# Patient Record
Sex: Female | Born: 1945 | Race: White | Hispanic: No | Marital: Married | Smoking: Former smoker
Health system: Southern US, Community
[De-identification: ages and names within clinical notes are randomized; demographics above are authoritative.]

## PROBLEM LIST (undated history)

## (undated) DIAGNOSIS — S52501A Unspecified fracture of the lower end of right radius, initial encounter for closed fracture: Secondary | ICD-10-CM

## (undated) DIAGNOSIS — K219 Gastro-esophageal reflux disease without esophagitis: Secondary | ICD-10-CM

## (undated) DIAGNOSIS — C801 Malignant (primary) neoplasm, unspecified: Secondary | ICD-10-CM

## (undated) DIAGNOSIS — R519 Headache, unspecified: Secondary | ICD-10-CM

## (undated) DIAGNOSIS — G4733 Obstructive sleep apnea (adult) (pediatric): Secondary | ICD-10-CM

## (undated) DIAGNOSIS — E039 Hypothyroidism, unspecified: Secondary | ICD-10-CM

## (undated) DIAGNOSIS — E785 Hyperlipidemia, unspecified: Secondary | ICD-10-CM

## (undated) DIAGNOSIS — F419 Anxiety disorder, unspecified: Secondary | ICD-10-CM

## (undated) DIAGNOSIS — R51 Headache: Secondary | ICD-10-CM

## (undated) DIAGNOSIS — E559 Vitamin D deficiency, unspecified: Secondary | ICD-10-CM

## (undated) DIAGNOSIS — C569 Malignant neoplasm of unspecified ovary: Secondary | ICD-10-CM

## (undated) DIAGNOSIS — G43909 Migraine, unspecified, not intractable, without status migrainosus: Secondary | ICD-10-CM

## (undated) DIAGNOSIS — G2581 Restless legs syndrome: Secondary | ICD-10-CM

## (undated) DIAGNOSIS — M810 Age-related osteoporosis without current pathological fracture: Secondary | ICD-10-CM

## (undated) DIAGNOSIS — E119 Type 2 diabetes mellitus without complications: Secondary | ICD-10-CM

## (undated) DIAGNOSIS — I2510 Atherosclerotic heart disease of native coronary artery without angina pectoris: Secondary | ICD-10-CM

## (undated) HISTORY — DX: Type 2 diabetes mellitus without complications: E11.9

## (undated) HISTORY — DX: Age-related osteoporosis without current pathological fracture: M81.0

## (undated) HISTORY — PX: OTHER SURGICAL HISTORY: SHX169

## (undated) HISTORY — DX: Restless legs syndrome: G25.81

## (undated) HISTORY — PX: CATARACT EXTRACTION: SUR2

## (undated) HISTORY — DX: Malignant neoplasm of unspecified ovary: C56.9

## (undated) HISTORY — DX: Migraine, unspecified, not intractable, without status migrainosus: G43.909

## (undated) HISTORY — DX: Hyperlipidemia, unspecified: E78.5

## (undated) HISTORY — DX: Vitamin D deficiency, unspecified: E55.9

## (undated) HISTORY — DX: Atherosclerotic heart disease of native coronary artery without angina pectoris: I25.10

## (undated) HISTORY — DX: Obstructive sleep apnea (adult) (pediatric): G47.33

---

## 1964-05-27 HISTORY — PX: CHOLECYSTECTOMY: SHX55

## 1997-05-27 HISTORY — PX: VESICOVAGINAL FISTULA CLOSURE W/ TAH: SUR271

## 1997-08-16 ENCOUNTER — Other Ambulatory Visit: Admission: RE | Admit: 1997-08-16 | Discharge: 1997-08-16 | Payer: Self-pay | Admitting: Gynecology

## 1998-08-29 ENCOUNTER — Other Ambulatory Visit: Admission: RE | Admit: 1998-08-29 | Discharge: 1998-08-29 | Payer: Self-pay | Admitting: Gynecology

## 1999-09-26 ENCOUNTER — Other Ambulatory Visit: Admission: RE | Admit: 1999-09-26 | Discharge: 1999-09-26 | Payer: Self-pay | Admitting: Gynecology

## 2000-11-18 ENCOUNTER — Other Ambulatory Visit: Admission: RE | Admit: 2000-11-18 | Discharge: 2000-11-18 | Payer: Self-pay | Admitting: Gynecology

## 2003-12-20 ENCOUNTER — Other Ambulatory Visit: Admission: RE | Admit: 2003-12-20 | Discharge: 2003-12-20 | Payer: Self-pay | Admitting: Family Medicine

## 2008-06-13 ENCOUNTER — Inpatient Hospital Stay (HOSPITAL_COMMUNITY): Admission: EM | Admit: 2008-06-13 | Discharge: 2008-06-24 | Payer: Self-pay | Admitting: Internal Medicine

## 2008-06-13 ENCOUNTER — Ambulatory Visit: Payer: Self-pay | Admitting: Internal Medicine

## 2008-06-17 ENCOUNTER — Encounter: Payer: Self-pay | Admitting: Pulmonary Disease

## 2008-06-22 ENCOUNTER — Ambulatory Visit: Payer: Self-pay | Admitting: Gastroenterology

## 2008-06-22 ENCOUNTER — Encounter: Payer: Self-pay | Admitting: Gastroenterology

## 2008-06-29 ENCOUNTER — Ambulatory Visit (HOSPITAL_COMMUNITY): Admission: RE | Admit: 2008-06-29 | Discharge: 2008-06-29 | Payer: Self-pay | Admitting: Internal Medicine

## 2008-07-01 ENCOUNTER — Encounter (INDEPENDENT_AMBULATORY_CARE_PROVIDER_SITE_OTHER): Payer: Self-pay | Admitting: Diagnostic Radiology

## 2008-07-01 ENCOUNTER — Ambulatory Visit (HOSPITAL_COMMUNITY): Admission: RE | Admit: 2008-07-01 | Discharge: 2008-07-01 | Payer: Self-pay | Admitting: Internal Medicine

## 2008-07-11 ENCOUNTER — Ambulatory Visit (HOSPITAL_BASED_OUTPATIENT_CLINIC_OR_DEPARTMENT_OTHER): Admission: RE | Admit: 2008-07-11 | Discharge: 2008-07-11 | Payer: Self-pay | Admitting: General Surgery

## 2008-07-11 ENCOUNTER — Encounter (INDEPENDENT_AMBULATORY_CARE_PROVIDER_SITE_OTHER): Payer: Self-pay | Admitting: General Surgery

## 2008-07-14 ENCOUNTER — Ambulatory Visit: Payer: Self-pay | Admitting: Oncology

## 2008-07-14 LAB — CBC WITH DIFFERENTIAL/PLATELET
BASO%: 0 % (ref 0.0–2.0)
LYMPH%: 3.6 % — ABNORMAL LOW (ref 14.0–48.0)
MCHC: 32.2 g/dL (ref 32.0–36.0)
MONO#: 1.2 10*3/uL — ABNORMAL HIGH (ref 0.1–0.9)
Platelets: 275 10*3/uL (ref 145–400)
RBC: 4.76 10*6/uL (ref 3.70–5.32)
WBC: 19.1 10*3/uL — ABNORMAL HIGH (ref 3.9–10.0)

## 2008-07-14 LAB — HEPATIC FUNCTION PANEL
AST: 26 U/L (ref 0–37)
Albumin: 3 g/dL — ABNORMAL LOW (ref 3.5–5.2)
Total Bilirubin: 0.5 mg/dL (ref 0.3–1.2)

## 2008-07-14 LAB — CHCC SMEAR

## 2008-07-14 LAB — FERRITIN: Ferritin: 413 ng/mL — ABNORMAL HIGH (ref 10–291)

## 2008-07-18 ENCOUNTER — Ambulatory Visit: Admission: RE | Admit: 2008-07-18 | Discharge: 2008-07-18 | Payer: Self-pay | Admitting: Oncology

## 2008-07-20 ENCOUNTER — Ambulatory Visit (HOSPITAL_COMMUNITY): Admission: RE | Admit: 2008-07-20 | Discharge: 2008-07-20 | Payer: Self-pay | Admitting: Oncology

## 2008-07-25 ENCOUNTER — Ambulatory Visit (HOSPITAL_COMMUNITY): Admission: RE | Admit: 2008-07-25 | Discharge: 2008-07-25 | Payer: Self-pay | Admitting: Oncology

## 2008-08-02 LAB — COMPREHENSIVE METABOLIC PANEL
ALT: 69 U/L — ABNORMAL HIGH (ref 0–35)
Albumin: 2.3 g/dL — ABNORMAL LOW (ref 3.5–5.2)
CO2: 24 mEq/L (ref 19–32)
Calcium: 8.5 mg/dL (ref 8.4–10.5)
Chloride: 99 mEq/L (ref 96–112)
Creatinine, Ser: 0.45 mg/dL (ref 0.40–1.20)
Potassium: 3.9 mEq/L (ref 3.5–5.3)

## 2008-08-02 LAB — CBC WITH DIFFERENTIAL/PLATELET
BASO%: 0.1 % (ref 0.0–2.0)
HCT: 33.5 % — ABNORMAL LOW (ref 34.8–46.6)
LYMPH%: 6 % — ABNORMAL LOW (ref 14.0–49.7)
MCH: 24.4 pg — ABNORMAL LOW (ref 25.1–34.0)
MCHC: 32.5 g/dL (ref 31.5–36.0)
MCV: 75.1 fL — ABNORMAL LOW (ref 79.5–101.0)
MONO#: 0.1 10*3/uL (ref 0.1–0.9)
MONO%: 1.9 % (ref 0.0–14.0)
NEUT%: 88.7 % — ABNORMAL HIGH (ref 38.4–76.8)
Platelets: 289 10*3/uL (ref 145–400)
RBC: 4.46 10*6/uL (ref 3.70–5.45)

## 2008-08-08 LAB — COMPREHENSIVE METABOLIC PANEL
ALT: 55 U/L — ABNORMAL HIGH (ref 0–35)
AST: 43 U/L — ABNORMAL HIGH (ref 0–37)
CO2: 27 mEq/L (ref 19–32)
Calcium: 8.6 mg/dL (ref 8.4–10.5)
Chloride: 101 mEq/L (ref 96–112)
Potassium: 4.2 mEq/L (ref 3.5–5.3)
Sodium: 134 mEq/L — ABNORMAL LOW (ref 135–145)
Total Protein: 6.8 g/dL (ref 6.0–8.3)

## 2008-08-08 LAB — CBC WITH DIFFERENTIAL/PLATELET
BASO%: 0.3 % (ref 0.0–2.0)
EOS%: 4.7 % (ref 0.0–7.0)
HCT: 33 % — ABNORMAL LOW (ref 34.8–46.6)
MCH: 24.6 pg — ABNORMAL LOW (ref 25.1–34.0)
MCHC: 32.3 g/dL (ref 31.5–36.0)
MONO#: 0.6 10*3/uL (ref 0.1–0.9)
NEUT%: 67.4 % (ref 38.4–76.8)
RBC: 4.34 10*6/uL (ref 3.70–5.45)
RDW: 19.5 % — ABNORMAL HIGH (ref 11.2–14.5)
WBC: 3.8 10*3/uL — ABNORMAL LOW (ref 3.9–10.3)
lymph#: 0.4 10*3/uL — ABNORMAL LOW (ref 0.9–3.3)

## 2008-08-08 LAB — LACTATE DEHYDROGENASE: LDH: 87 U/L — ABNORMAL LOW (ref 94–250)

## 2008-08-16 LAB — CBC WITH DIFFERENTIAL/PLATELET
BASO%: 0.1 % (ref 0.0–2.0)
Basophils Absolute: 0 10*3/uL (ref 0.0–0.1)
EOS%: 1 % (ref 0.0–7.0)
HGB: 11.1 g/dL — ABNORMAL LOW (ref 11.6–15.9)
MCH: 23.8 pg — ABNORMAL LOW (ref 25.1–34.0)
MCHC: 31.4 g/dL — ABNORMAL LOW (ref 31.5–36.0)
MCV: 75.8 fL — ABNORMAL LOW (ref 79.5–101.0)
MONO%: 0.7 % (ref 0.0–14.0)
NEUT%: 86.7 % — ABNORMAL HIGH (ref 38.4–76.8)
RDW: 18.8 % — ABNORMAL HIGH (ref 11.2–14.5)
lymph#: 1.2 10*3/uL (ref 0.9–3.3)

## 2008-08-23 LAB — CBC WITH DIFFERENTIAL/PLATELET
Eosinophils Absolute: 0.1 10*3/uL (ref 0.0–0.5)
MONO#: 1 10*3/uL — ABNORMAL HIGH (ref 0.1–0.9)
NEUT#: 1.6 10*3/uL (ref 1.5–6.5)
RBC: 4.19 10*6/uL (ref 3.70–5.45)
RDW: 19.1 % — ABNORMAL HIGH (ref 11.2–14.5)
WBC: 3.5 10*3/uL — ABNORMAL LOW (ref 3.9–10.3)
lymph#: 0.8 10*3/uL — ABNORMAL LOW (ref 0.9–3.3)
nRBC: 0 % (ref 0–0)

## 2008-08-23 LAB — COMPREHENSIVE METABOLIC PANEL
ALT: 26 U/L (ref 0–35)
CO2: 27 mEq/L (ref 19–32)
Calcium: 8.6 mg/dL (ref 8.4–10.5)
Chloride: 106 mEq/L (ref 96–112)
Sodium: 140 mEq/L (ref 135–145)
Total Bilirubin: 0.2 mg/dL — ABNORMAL LOW (ref 0.3–1.2)
Total Protein: 6 g/dL (ref 6.0–8.3)

## 2008-08-24 ENCOUNTER — Ambulatory Visit: Payer: Self-pay | Admitting: Oncology

## 2008-08-30 LAB — COMPREHENSIVE METABOLIC PANEL
ALT: 41 U/L — ABNORMAL HIGH (ref 0–35)
Calcium: 8.9 mg/dL (ref 8.4–10.5)
Potassium: 4 mEq/L (ref 3.5–5.3)
Sodium: 135 mEq/L (ref 135–145)

## 2008-08-30 LAB — CBC WITH DIFFERENTIAL/PLATELET
BASO%: 0.1 % (ref 0.0–2.0)
Eosinophils Absolute: 0.1 10*3/uL (ref 0.0–0.5)
LYMPH%: 4.4 % — ABNORMAL LOW (ref 14.0–49.7)
MCHC: 32.4 g/dL (ref 31.5–36.0)
MONO#: 0.1 10*3/uL (ref 0.1–0.9)
NEUT#: 13.4 10*3/uL — ABNORMAL HIGH (ref 1.5–6.5)
Platelets: 267 10*3/uL (ref 145–400)
RBC: 4.94 10*6/uL (ref 3.70–5.45)
RDW: 19 % — ABNORMAL HIGH (ref 11.2–14.5)
WBC: 14.2 10*3/uL — ABNORMAL HIGH (ref 3.9–10.3)

## 2008-08-30 LAB — LACTATE DEHYDROGENASE: LDH: 84 U/L — ABNORMAL LOW (ref 94–250)

## 2008-09-06 LAB — COMPREHENSIVE METABOLIC PANEL
AST: 17 U/L (ref 0–37)
Albumin: 2.7 g/dL — ABNORMAL LOW (ref 3.5–5.2)
Alkaline Phosphatase: 111 U/L (ref 39–117)
Calcium: 8.4 mg/dL (ref 8.4–10.5)
Chloride: 108 mEq/L (ref 96–112)
Glucose, Bld: 73 mg/dL (ref 70–99)
Potassium: 3 mEq/L — ABNORMAL LOW (ref 3.5–5.3)
Sodium: 139 mEq/L (ref 135–145)
Total Protein: 5.4 g/dL — ABNORMAL LOW (ref 6.0–8.3)

## 2008-09-06 LAB — CBC WITH DIFFERENTIAL/PLATELET
BASO%: 0.8 % (ref 0.0–2.0)
Eosinophils Absolute: 0.1 10*3/uL (ref 0.0–0.5)
HCT: 35 % (ref 34.8–46.6)
LYMPH%: 17.5 % (ref 14.0–49.7)
MCHC: 32 g/dL (ref 31.5–36.0)
MONO#: 0.6 10*3/uL (ref 0.1–0.9)
NEUT#: 3.2 10*3/uL (ref 1.5–6.5)
NEUT%: 67.1 % (ref 38.4–76.8)
Platelets: 310 10*3/uL (ref 145–400)
WBC: 4.7 10*3/uL (ref 3.9–10.3)
lymph#: 0.8 10*3/uL — ABNORMAL LOW (ref 0.9–3.3)
nRBC: 0 % (ref 0–0)

## 2008-09-13 LAB — COMPREHENSIVE METABOLIC PANEL
ALT: 68 U/L — ABNORMAL HIGH (ref 0–35)
AST: 35 U/L (ref 0–37)
Albumin: 3 g/dL — ABNORMAL LOW (ref 3.5–5.2)
Alkaline Phosphatase: 92 U/L (ref 39–117)
BUN: 15 mg/dL (ref 6–23)
CO2: 26 mEq/L (ref 19–32)
Calcium: 8.5 mg/dL (ref 8.4–10.5)
Chloride: 104 mEq/L (ref 96–112)
Creatinine, Ser: 0.5 mg/dL (ref 0.40–1.20)
Glucose, Bld: 108 mg/dL — ABNORMAL HIGH (ref 70–99)
Potassium: 3.5 mEq/L (ref 3.5–5.3)
Sodium: 138 mEq/L (ref 135–145)
Total Bilirubin: 0.5 mg/dL (ref 0.3–1.2)
Total Protein: 5.5 g/dL — ABNORMAL LOW (ref 6.0–8.3)

## 2008-09-13 LAB — LACTATE DEHYDROGENASE: LDH: 95 U/L (ref 94–250)

## 2008-09-13 LAB — CBC WITH DIFFERENTIAL/PLATELET
BASO%: 0 % (ref 0.0–2.0)
EOS%: 0.3 % (ref 0.0–7.0)
MCH: 25.7 pg (ref 25.1–34.0)
MCV: 78.4 fL — ABNORMAL LOW (ref 79.5–101.0)
MONO%: 0.5 % (ref 0.0–14.0)
RBC: 4.53 10*6/uL (ref 3.70–5.45)
RDW: 21.2 % — ABNORMAL HIGH (ref 11.2–14.5)
lymph#: 1.9 10*3/uL (ref 0.9–3.3)

## 2008-09-20 LAB — COMPREHENSIVE METABOLIC PANEL
ALT: 35 U/L (ref 0–35)
AST: 20 U/L (ref 0–37)
Alkaline Phosphatase: 101 U/L (ref 39–117)
CO2: 26 mEq/L (ref 19–32)
Creatinine, Ser: 0.44 mg/dL (ref 0.40–1.20)
Sodium: 139 mEq/L (ref 135–145)
Total Bilirubin: 0.5 mg/dL (ref 0.3–1.2)
Total Protein: 5.4 g/dL — ABNORMAL LOW (ref 6.0–8.3)

## 2008-09-20 LAB — TECHNOLOGIST REVIEW

## 2008-09-20 LAB — CBC WITH DIFFERENTIAL/PLATELET
Basophils Absolute: 0 10*3/uL (ref 0.0–0.1)
EOS%: 1.4 % (ref 0.0–7.0)
HGB: 11 g/dL — ABNORMAL LOW (ref 11.6–15.9)
MCH: 25.4 pg (ref 25.1–34.0)
MCV: 78.3 fL — ABNORMAL LOW (ref 79.5–101.0)
MONO%: 32.3 % — ABNORMAL HIGH (ref 0.0–14.0)
NEUT#: 1 10*3/uL — ABNORMAL LOW (ref 1.5–6.5)
RBC: 4.33 10*6/uL (ref 3.70–5.45)
RDW: 21.8 % — ABNORMAL HIGH (ref 11.2–14.5)
lymph#: 0.9 10*3/uL (ref 0.9–3.3)
nRBC: 1 % — ABNORMAL HIGH (ref 0–0)

## 2008-09-28 LAB — BASIC METABOLIC PANEL
BUN: 7 mg/dL (ref 6–23)
Chloride: 109 mEq/L (ref 96–112)
Potassium: 2.7 mEq/L — CL (ref 3.5–5.3)

## 2008-10-04 LAB — COMPREHENSIVE METABOLIC PANEL
Albumin: 2.3 g/dL — ABNORMAL LOW (ref 3.5–5.2)
Alkaline Phosphatase: 99 U/L (ref 39–117)
BUN: 9 mg/dL (ref 6–23)
Calcium: 8 mg/dL — ABNORMAL LOW (ref 8.4–10.5)
Chloride: 106 mEq/L (ref 96–112)
Creatinine, Ser: 0.37 mg/dL — ABNORMAL LOW (ref 0.40–1.20)
Glucose, Bld: 72 mg/dL (ref 70–99)
Potassium: 3.5 mEq/L (ref 3.5–5.3)

## 2008-10-04 LAB — CBC WITH DIFFERENTIAL/PLATELET
Basophils Absolute: 0.1 10*3/uL (ref 0.0–0.1)
Eosinophils Absolute: 0 10*3/uL (ref 0.0–0.5)
HCT: 35.2 % (ref 34.8–46.6)
HGB: 11.1 g/dL — ABNORMAL LOW (ref 11.6–15.9)
LYMPH%: 22 % (ref 14.0–49.7)
MCV: 82.2 fL (ref 79.5–101.0)
MONO%: 25.2 % — ABNORMAL HIGH (ref 0.0–14.0)
NEUT#: 2 10*3/uL (ref 1.5–6.5)
NEUT%: 49.4 % (ref 38.4–76.8)
Platelets: 175 10*3/uL (ref 145–400)

## 2008-10-11 ENCOUNTER — Ambulatory Visit: Payer: Self-pay | Admitting: Oncology

## 2008-10-11 LAB — TECHNOLOGIST REVIEW

## 2008-10-11 LAB — CBC WITH DIFFERENTIAL/PLATELET
Basophils Absolute: 0 10*3/uL (ref 0.0–0.1)
Eosinophils Absolute: 0 10*3/uL (ref 0.0–0.5)
HGB: 12.4 g/dL (ref 11.6–15.9)
MONO#: 0 10*3/uL — ABNORMAL LOW (ref 0.1–0.9)
NEUT#: 12.6 10*3/uL — ABNORMAL HIGH (ref 1.5–6.5)
RBC: 4.66 10*6/uL (ref 3.70–5.45)
RDW: 24.8 % — ABNORMAL HIGH (ref 11.2–14.5)
WBC: 14.3 10*3/uL — ABNORMAL HIGH (ref 3.9–10.3)

## 2008-10-11 LAB — COMPREHENSIVE METABOLIC PANEL
Albumin: 3.3 g/dL — ABNORMAL LOW (ref 3.5–5.2)
Alkaline Phosphatase: 98 U/L (ref 39–117)
BUN: 17 mg/dL (ref 6–23)
CO2: 26 mEq/L (ref 19–32)
Glucose, Bld: 96 mg/dL (ref 70–99)
Potassium: 3.3 mEq/L — ABNORMAL LOW (ref 3.5–5.3)
Total Bilirubin: 0.5 mg/dL (ref 0.3–1.2)
Total Protein: 6 g/dL (ref 6.0–8.3)

## 2008-10-13 ENCOUNTER — Ambulatory Visit (HOSPITAL_COMMUNITY): Admission: RE | Admit: 2008-10-13 | Discharge: 2008-10-13 | Payer: Self-pay | Admitting: Oncology

## 2008-10-18 LAB — CBC WITH DIFFERENTIAL/PLATELET
Basophils Absolute: 0 10*3/uL (ref 0.0–0.1)
Eosinophils Absolute: 0 10*3/uL (ref 0.0–0.5)
HGB: 10.5 g/dL — ABNORMAL LOW (ref 11.6–15.9)
MCV: 82.6 fL (ref 79.5–101.0)
MONO#: 0.9 10*3/uL (ref 0.1–0.9)
MONO%: 30.5 % — ABNORMAL HIGH (ref 0.0–14.0)
NEUT#: 1.2 10*3/uL — ABNORMAL LOW (ref 1.5–6.5)
RBC: 3.97 10*6/uL (ref 3.70–5.45)
RDW: 22.9 % — ABNORMAL HIGH (ref 11.2–14.5)
WBC: 2.8 10*3/uL — ABNORMAL LOW (ref 3.9–10.3)

## 2008-10-18 LAB — LACTATE DEHYDROGENASE: LDH: 150 U/L (ref 94–250)

## 2008-10-18 LAB — COMPREHENSIVE METABOLIC PANEL
ALT: 33 U/L (ref 0–35)
Albumin: 2.5 g/dL — ABNORMAL LOW (ref 3.5–5.2)
CO2: 24 mEq/L (ref 19–32)
Calcium: 7.9 mg/dL — ABNORMAL LOW (ref 8.4–10.5)
Chloride: 107 mEq/L (ref 96–112)
Glucose, Bld: 94 mg/dL (ref 70–99)
Potassium: 3.8 mEq/L (ref 3.5–5.3)
Sodium: 138 mEq/L (ref 135–145)
Total Bilirubin: 0.5 mg/dL (ref 0.3–1.2)
Total Protein: 5.4 g/dL — ABNORMAL LOW (ref 6.0–8.3)

## 2008-10-25 LAB — COMPREHENSIVE METABOLIC PANEL
CO2: 25 mEq/L (ref 19–32)
Glucose, Bld: 98 mg/dL (ref 70–99)
Sodium: 133 mEq/L — ABNORMAL LOW (ref 135–145)
Total Bilirubin: 0.5 mg/dL (ref 0.3–1.2)
Total Protein: 5.5 g/dL — ABNORMAL LOW (ref 6.0–8.3)

## 2008-10-25 LAB — CBC WITH DIFFERENTIAL/PLATELET
Basophils Absolute: 0 10*3/uL (ref 0.0–0.1)
EOS%: 0.1 % (ref 0.0–7.0)
Eosinophils Absolute: 0 10*3/uL (ref 0.0–0.5)
HCT: 34.4 % — ABNORMAL LOW (ref 34.8–46.6)
HGB: 11.2 g/dL — ABNORMAL LOW (ref 11.6–15.9)
LYMPH%: 15.3 % (ref 14.0–49.7)
MCH: 26.7 pg (ref 25.1–34.0)
MCV: 82.1 fL (ref 79.5–101.0)
MONO%: 0.8 % (ref 0.0–14.0)
NEUT#: 8.6 10*3/uL — ABNORMAL HIGH (ref 1.5–6.5)
NEUT%: 83.7 % — ABNORMAL HIGH (ref 38.4–76.8)
Platelets: 202 10*3/uL (ref 145–400)
RDW: 21.9 % — ABNORMAL HIGH (ref 11.2–14.5)

## 2008-10-25 LAB — TECHNOLOGIST REVIEW

## 2008-11-01 LAB — CBC WITH DIFFERENTIAL/PLATELET
BASO%: 0.5 % (ref 0.0–2.0)
LYMPH%: 23.5 % (ref 14.0–49.7)
MCH: 27.2 pg (ref 25.1–34.0)
MCHC: 31.7 g/dL (ref 31.5–36.0)
MCV: 85.8 fL (ref 79.5–101.0)
MONO%: 24 % — ABNORMAL HIGH (ref 0.0–14.0)
Platelets: 93 10*3/uL — ABNORMAL LOW (ref 145–400)
RBC: 3.6 10*6/uL — ABNORMAL LOW (ref 3.70–5.45)
nRBC: 1 % — ABNORMAL HIGH (ref 0–0)

## 2008-11-01 LAB — COMPREHENSIVE METABOLIC PANEL
ALT: 27 U/L (ref 0–35)
AST: 24 U/L (ref 0–37)
Albumin: 2.6 g/dL — ABNORMAL LOW (ref 3.5–5.2)
Alkaline Phosphatase: 96 U/L (ref 39–117)
Glucose, Bld: 85 mg/dL (ref 70–99)
Potassium: 4.1 mEq/L (ref 3.5–5.3)
Sodium: 135 mEq/L (ref 135–145)
Total Protein: 5 g/dL — ABNORMAL LOW (ref 6.0–8.3)

## 2008-11-02 ENCOUNTER — Ambulatory Visit (HOSPITAL_COMMUNITY): Admission: RE | Admit: 2008-11-02 | Discharge: 2008-11-02 | Payer: Self-pay | Admitting: Oncology

## 2008-11-04 ENCOUNTER — Ambulatory Visit (HOSPITAL_COMMUNITY): Admission: RE | Admit: 2008-11-04 | Discharge: 2008-11-04 | Payer: Self-pay | Admitting: Oncology

## 2008-11-08 LAB — URINALYSIS, MICROSCOPIC - CHCC
Ketones: NEGATIVE mg/dL
Leukocyte Esterase: NEGATIVE
Nitrite: NEGATIVE
Protein: NEGATIVE mg/dL
RBC count: NEGATIVE (ref 0–2)
pH: 6 (ref 4.6–8.0)

## 2008-11-08 LAB — CBC WITH DIFFERENTIAL/PLATELET
Basophils Absolute: 0 10*3/uL (ref 0.0–0.1)
EOS%: 0.3 % (ref 0.0–7.0)
LYMPH%: 19 % (ref 14.0–49.7)
MCH: 29.4 pg (ref 25.1–34.0)
MCV: 85.8 fL (ref 79.5–101.0)
MONO%: 6.5 % (ref 0.0–14.0)
Platelets: 164 10*3/uL (ref 145–400)
RBC: 3.97 10*6/uL (ref 3.70–5.45)
RDW: 24.8 % — ABNORMAL HIGH (ref 11.2–14.5)

## 2008-11-08 LAB — COMPREHENSIVE METABOLIC PANEL
AST: 26 U/L (ref 0–37)
Albumin: 2.8 g/dL — ABNORMAL LOW (ref 3.5–5.2)
Alkaline Phosphatase: 104 U/L (ref 39–117)
BUN: 17 mg/dL (ref 6–23)
Calcium: 8.3 mg/dL — ABNORMAL LOW (ref 8.4–10.5)
Chloride: 103 mEq/L (ref 96–112)
Potassium: 4.3 mEq/L (ref 3.5–5.3)
Sodium: 135 mEq/L (ref 135–145)
Total Protein: 4.9 g/dL — ABNORMAL LOW (ref 6.0–8.3)

## 2008-11-10 ENCOUNTER — Encounter: Payer: Self-pay | Admitting: Oncology

## 2008-11-11 ENCOUNTER — Encounter (INDEPENDENT_AMBULATORY_CARE_PROVIDER_SITE_OTHER): Payer: Self-pay | Admitting: Interventional Radiology

## 2008-11-11 ENCOUNTER — Ambulatory Visit (HOSPITAL_COMMUNITY): Admission: RE | Admit: 2008-11-11 | Discharge: 2008-11-11 | Payer: Self-pay | Admitting: Oncology

## 2008-11-15 LAB — COMPREHENSIVE METABOLIC PANEL
ALT: 28 U/L (ref 0–35)
AST: 22 U/L (ref 0–37)
Alkaline Phosphatase: 113 U/L (ref 39–117)
Creatinine, Ser: 0.46 mg/dL (ref 0.40–1.20)
Total Bilirubin: 0.4 mg/dL (ref 0.3–1.2)

## 2008-11-15 LAB — CBC WITH DIFFERENTIAL/PLATELET
Basophils Absolute: 0.1 10*3/uL (ref 0.0–0.1)
EOS%: 0.6 % (ref 0.0–7.0)
Eosinophils Absolute: 0 10*3/uL (ref 0.0–0.5)
HCT: 31.9 % — ABNORMAL LOW (ref 34.8–46.6)
HGB: 9.9 g/dL — ABNORMAL LOW (ref 11.6–15.9)
LYMPH%: 12.7 % — ABNORMAL LOW (ref 14.0–49.7)
MCH: 27.9 pg (ref 25.1–34.0)
MCV: 89.9 fL (ref 79.5–101.0)
MONO%: 18.1 % — ABNORMAL HIGH (ref 0.0–14.0)
NEUT#: 3.4 10*3/uL (ref 1.5–6.5)
NEUT%: 67.4 % (ref 38.4–76.8)
Platelets: 197 10*3/uL (ref 145–400)
RDW: 24.8 % — ABNORMAL HIGH (ref 11.2–14.5)

## 2008-11-15 LAB — LACTATE DEHYDROGENASE: LDH: 269 U/L — ABNORMAL HIGH (ref 94–250)

## 2008-11-22 ENCOUNTER — Ambulatory Visit: Payer: Self-pay | Admitting: Oncology

## 2008-11-22 ENCOUNTER — Ambulatory Visit (HOSPITAL_COMMUNITY): Admission: RE | Admit: 2008-11-22 | Discharge: 2008-11-22 | Payer: Self-pay | Admitting: Interventional Radiology

## 2008-11-22 LAB — CBC WITH DIFFERENTIAL/PLATELET
BASO%: 0.1 % (ref 0.0–2.0)
HCT: 31.4 % — ABNORMAL LOW (ref 34.8–46.6)
LYMPH%: 10.6 % — ABNORMAL LOW (ref 14.0–49.7)
MCH: 30.1 pg (ref 25.1–34.0)
MCHC: 34.1 g/dL (ref 31.5–36.0)
MCV: 88.1 fL (ref 79.5–101.0)
MONO#: 0 10*3/uL — ABNORMAL LOW (ref 0.1–0.9)
NEUT%: 89 % — ABNORMAL HIGH (ref 38.4–76.8)
Platelets: 177 10*3/uL (ref 145–400)

## 2008-11-22 LAB — COMPREHENSIVE METABOLIC PANEL
Albumin: 2.9 g/dL — ABNORMAL LOW (ref 3.5–5.2)
Alkaline Phosphatase: 93 U/L (ref 39–117)
BUN: 18 mg/dL (ref 6–23)
Calcium: 8.2 mg/dL — ABNORMAL LOW (ref 8.4–10.5)
Glucose, Bld: 104 mg/dL — ABNORMAL HIGH (ref 70–99)
Potassium: 4 mEq/L (ref 3.5–5.3)

## 2008-11-23 ENCOUNTER — Ambulatory Visit (HOSPITAL_COMMUNITY): Admission: RE | Admit: 2008-11-23 | Discharge: 2008-11-23 | Payer: Self-pay | Admitting: Interventional Radiology

## 2008-11-25 ENCOUNTER — Encounter (INDEPENDENT_AMBULATORY_CARE_PROVIDER_SITE_OTHER): Payer: Self-pay | Admitting: Interventional Radiology

## 2008-11-25 ENCOUNTER — Ambulatory Visit (HOSPITAL_COMMUNITY): Admission: RE | Admit: 2008-11-25 | Discharge: 2008-11-25 | Payer: Self-pay | Admitting: Interventional Radiology

## 2008-11-29 ENCOUNTER — Ambulatory Visit (HOSPITAL_COMMUNITY)
Admission: RE | Admit: 2008-11-29 | Discharge: 2008-11-29 | Payer: Self-pay | Admitting: Physical Medicine and Rehabilitation

## 2008-11-29 LAB — TECHNOLOGIST REVIEW

## 2008-11-29 LAB — CBC WITH DIFFERENTIAL/PLATELET
BASO%: 0.6 % (ref 0.0–2.0)
Eosinophils Absolute: 0 10*3/uL (ref 0.0–0.5)
LYMPH%: 30.2 % (ref 14.0–49.7)
MCHC: 31.6 g/dL (ref 31.5–36.0)
MONO#: 0.5 10*3/uL (ref 0.1–0.9)
NEUT#: 0.7 10*3/uL — ABNORMAL LOW (ref 1.5–6.5)
Platelets: 86 10*3/uL — ABNORMAL LOW (ref 145–400)
RBC: 3.83 10*6/uL (ref 3.70–5.45)
RDW: 24.3 % — ABNORMAL HIGH (ref 11.2–14.5)
WBC: 1.7 10*3/uL — ABNORMAL LOW (ref 3.9–10.3)
lymph#: 0.5 10*3/uL — ABNORMAL LOW (ref 0.9–3.3)

## 2008-11-29 LAB — COMPREHENSIVE METABOLIC PANEL
Albumin: 2.7 g/dL — ABNORMAL LOW (ref 3.5–5.2)
BUN: 10 mg/dL (ref 6–23)
Calcium: 8.1 mg/dL — ABNORMAL LOW (ref 8.4–10.5)
Chloride: 107 mEq/L (ref 96–112)
Creatinine, Ser: 0.52 mg/dL (ref 0.40–1.20)
Glucose, Bld: 106 mg/dL — ABNORMAL HIGH (ref 70–99)
Potassium: 4 mEq/L (ref 3.5–5.3)

## 2008-12-06 ENCOUNTER — Ambulatory Visit (HOSPITAL_COMMUNITY): Admission: RE | Admit: 2008-12-06 | Discharge: 2008-12-06 | Payer: Self-pay | Admitting: Interventional Radiology

## 2008-12-06 ENCOUNTER — Ambulatory Visit (HOSPITAL_COMMUNITY): Admission: RE | Admit: 2008-12-06 | Discharge: 2008-12-06 | Payer: Self-pay | Admitting: Oncology

## 2008-12-06 LAB — COMPREHENSIVE METABOLIC PANEL
AST: 31 U/L (ref 0–37)
Albumin: 3 g/dL — ABNORMAL LOW (ref 3.5–5.2)
Alkaline Phosphatase: 210 U/L — ABNORMAL HIGH (ref 39–117)
Calcium: 8.2 mg/dL — ABNORMAL LOW (ref 8.4–10.5)
Chloride: 107 mEq/L (ref 96–112)
Glucose, Bld: 103 mg/dL — ABNORMAL HIGH (ref 70–99)
Potassium: 4.1 mEq/L (ref 3.5–5.3)
Sodium: 137 mEq/L (ref 135–145)
Total Protein: 5.2 g/dL — ABNORMAL LOW (ref 6.0–8.3)

## 2008-12-06 LAB — CBC WITH DIFFERENTIAL/PLATELET
BASO%: 1.2 % (ref 0.0–2.0)
Basophils Absolute: 0.2 10*3/uL — ABNORMAL HIGH (ref 0.0–0.1)
EOS%: 0.1 % (ref 0.0–7.0)
HCT: 34.1 % — ABNORMAL LOW (ref 34.8–46.6)
HGB: 10.7 g/dL — ABNORMAL LOW (ref 11.6–15.9)
MCH: 29.3 pg (ref 25.1–34.0)
MONO#: 2.3 10*3/uL — ABNORMAL HIGH (ref 0.1–0.9)
RDW: 24.8 % — ABNORMAL HIGH (ref 11.2–14.5)
WBC: 20.5 10*3/uL — ABNORMAL HIGH (ref 3.9–10.3)
lymph#: 1.6 10*3/uL (ref 0.9–3.3)

## 2008-12-12 ENCOUNTER — Ambulatory Visit (HOSPITAL_COMMUNITY): Admission: RE | Admit: 2008-12-12 | Discharge: 2008-12-12 | Payer: Self-pay | Admitting: Oncology

## 2008-12-13 LAB — CBC WITH DIFFERENTIAL/PLATELET
BASO%: 2.3 % — ABNORMAL HIGH (ref 0.0–2.0)
EOS%: 0.1 % (ref 0.0–7.0)
MCH: 31.3 pg (ref 25.1–34.0)
MCHC: 33.2 g/dL (ref 31.5–36.0)
MCV: 94.4 fL (ref 79.5–101.0)
MONO%: 2.5 % (ref 0.0–14.0)
RBC: 3.77 10*6/uL (ref 3.70–5.45)
RDW: 26 % — ABNORMAL HIGH (ref 11.2–14.5)
lymph#: 1.5 10*3/uL (ref 0.9–3.3)

## 2008-12-13 LAB — COMPREHENSIVE METABOLIC PANEL
AST: 27 U/L (ref 0–37)
Albumin: 3 g/dL — ABNORMAL LOW (ref 3.5–5.2)
Alkaline Phosphatase: 253 U/L — ABNORMAL HIGH (ref 39–117)
BUN: 16 mg/dL (ref 6–23)
Glucose, Bld: 97 mg/dL (ref 70–99)
Potassium: 4 mEq/L (ref 3.5–5.3)
Total Bilirubin: 0.5 mg/dL (ref 0.3–1.2)

## 2008-12-16 ENCOUNTER — Ambulatory Visit: Payer: Self-pay | Admitting: Oncology

## 2008-12-20 LAB — CBC WITH DIFFERENTIAL/PLATELET
BASO%: 0.4 % (ref 0.0–2.0)
Basophils Absolute: 0.1 10*3/uL (ref 0.0–0.1)
EOS%: 0.8 % (ref 0.0–7.0)
Eosinophils Absolute: 0.1 10*3/uL (ref 0.0–0.5)
HCT: 33 % — ABNORMAL LOW (ref 34.8–46.6)
HGB: 10.7 g/dL — ABNORMAL LOW (ref 11.6–15.9)
LYMPH%: 5.4 % — ABNORMAL LOW (ref 14.0–49.7)
MCH: 31.2 pg (ref 25.1–34.0)
MCHC: 32.5 g/dL (ref 31.5–36.0)
MCV: 96 fL (ref 79.5–101.0)
MONO#: 0.6 10*3/uL (ref 0.1–0.9)
MONO%: 4.7 % (ref 0.0–14.0)
NEUT#: 11.3 10*3/uL — ABNORMAL HIGH (ref 1.5–6.5)
NEUT%: 88.7 % — ABNORMAL HIGH (ref 38.4–76.8)
Platelets: 78 10*3/uL — ABNORMAL LOW (ref 145–400)
RBC: 3.43 10*6/uL — ABNORMAL LOW (ref 3.70–5.45)
RDW: 25.2 % — ABNORMAL HIGH (ref 11.2–14.5)
WBC: 12.8 10*3/uL — ABNORMAL HIGH (ref 3.9–10.3)
lymph#: 0.7 10*3/uL — ABNORMAL LOW (ref 0.9–3.3)

## 2008-12-20 LAB — COMPREHENSIVE METABOLIC PANEL
AST: 21 U/L (ref 0–37)
Albumin: 2.9 g/dL — ABNORMAL LOW (ref 3.5–5.2)
Alkaline Phosphatase: 185 U/L — ABNORMAL HIGH (ref 39–117)
BUN: 15 mg/dL (ref 6–23)
Creatinine, Ser: 0.4 mg/dL (ref 0.40–1.20)
Glucose, Bld: 105 mg/dL — ABNORMAL HIGH (ref 70–99)
Potassium: 4.1 mEq/L (ref 3.5–5.3)

## 2008-12-21 ENCOUNTER — Encounter: Payer: Self-pay | Admitting: Oncology

## 2008-12-21 ENCOUNTER — Ambulatory Visit: Payer: Self-pay | Admitting: Vascular Surgery

## 2008-12-21 ENCOUNTER — Ambulatory Visit (HOSPITAL_COMMUNITY): Admission: RE | Admit: 2008-12-21 | Discharge: 2008-12-21 | Payer: Self-pay | Admitting: Oncology

## 2008-12-23 LAB — URINALYSIS, MICROSCOPIC - CHCC
Bilirubin (Urine): NEGATIVE
Nitrite: NEGATIVE
Protein: NEGATIVE mg/dL
pH: 7 (ref 4.6–8.0)

## 2008-12-23 LAB — PROTIME-INR
INR: 1.7 — ABNORMAL LOW (ref 2.00–3.50)
Protime: 20.4 Seconds — ABNORMAL HIGH (ref 10.6–13.4)

## 2008-12-23 LAB — CBC WITH DIFFERENTIAL/PLATELET
Basophils Absolute: 0.2 10*3/uL — ABNORMAL HIGH (ref 0.0–0.1)
Eosinophils Absolute: 0 10*3/uL (ref 0.0–0.5)
HCT: 29.7 % — ABNORMAL LOW (ref 34.8–46.6)
HGB: 9.7 g/dL — ABNORMAL LOW (ref 11.6–15.9)
MCH: 30.7 pg (ref 25.1–34.0)
MCV: 94 fL (ref 79.5–101.0)
MONO%: 0.8 % (ref 0.0–14.0)
NEUT#: 73.5 10*3/uL — ABNORMAL HIGH (ref 1.5–6.5)
NEUT%: 98.2 % — ABNORMAL HIGH (ref 38.4–76.8)
RDW: 22.6 % — ABNORMAL HIGH (ref 11.2–14.5)
lymph#: 0.5 10*3/uL — ABNORMAL LOW (ref 0.9–3.3)

## 2008-12-26 LAB — CBC WITH DIFFERENTIAL/PLATELET
BASO%: 0.2 % (ref 0.0–2.0)
LYMPH%: 9 % — ABNORMAL LOW (ref 14.0–49.7)
MCH: 30.3 pg (ref 25.1–34.0)
MCHC: 32.7 g/dL (ref 31.5–36.0)
MCV: 92.6 fL (ref 79.5–101.0)
MONO%: 3.3 % (ref 0.0–14.0)
Platelets: 166 10*3/uL (ref 145–400)
RBC: 3.9 10*6/uL (ref 3.70–5.45)
nRBC: 0 % (ref 0–0)

## 2008-12-26 LAB — COMPREHENSIVE METABOLIC PANEL
ALT: 38 U/L — ABNORMAL HIGH (ref 0–35)
AST: 27 U/L (ref 0–37)
BUN: 12 mg/dL (ref 6–23)
Creatinine, Ser: 0.42 mg/dL (ref 0.40–1.20)
Total Bilirubin: 0.5 mg/dL (ref 0.3–1.2)

## 2008-12-26 LAB — PROTIME-INR: Protime: 18 Seconds — ABNORMAL HIGH (ref 10.6–13.4)

## 2008-12-29 LAB — CBC WITH DIFFERENTIAL/PLATELET
BASO%: 0.8 % (ref 0.0–2.0)
Basophils Absolute: 0.2 10*3/uL — ABNORMAL HIGH (ref 0.0–0.1)
EOS%: 0.1 % (ref 0.0–7.0)
HGB: 11.2 g/dL — ABNORMAL LOW (ref 11.6–15.9)
MCH: 31.5 pg (ref 25.1–34.0)
MCHC: 33 g/dL (ref 31.5–36.0)
MCV: 95.2 fL (ref 79.5–101.0)
MONO%: 0.4 % (ref 0.0–14.0)
RBC: 3.56 10*6/uL — ABNORMAL LOW (ref 3.70–5.45)
RDW: 24.5 % — ABNORMAL HIGH (ref 11.2–14.5)

## 2008-12-29 LAB — PROTIME-INR
INR: 3.7 — ABNORMAL HIGH (ref 2.00–3.50)
Protime: 44.4 Seconds — ABNORMAL HIGH (ref 10.6–13.4)

## 2009-01-02 LAB — COMPREHENSIVE METABOLIC PANEL
ALT: 30 U/L (ref 0–35)
AST: 25 U/L (ref 0–37)
Creatinine, Ser: 0.49 mg/dL (ref 0.40–1.20)
Total Bilirubin: 0.3 mg/dL (ref 0.3–1.2)

## 2009-01-02 LAB — CBC WITH DIFFERENTIAL/PLATELET
BASO%: 0.2 % (ref 0.0–2.0)
LYMPH%: 4.8 % — ABNORMAL LOW (ref 14.0–49.7)
MCHC: 32.6 g/dL (ref 31.5–36.0)
MCV: 96.1 fL (ref 79.5–101.0)
MONO#: 0.7 10*3/uL (ref 0.1–0.9)
MONO%: 3.8 % (ref 0.0–14.0)
Platelets: 200 10*3/uL (ref 145–400)
RBC: 3.83 10*6/uL (ref 3.70–5.45)
WBC: 17.1 10*3/uL — ABNORMAL HIGH (ref 3.9–10.3)

## 2009-01-02 LAB — TECHNOLOGIST REVIEW

## 2009-01-09 ENCOUNTER — Ambulatory Visit (HOSPITAL_COMMUNITY): Admission: RE | Admit: 2009-01-09 | Discharge: 2009-01-09 | Payer: Self-pay | Admitting: Interventional Radiology

## 2009-01-09 LAB — CBC WITH DIFFERENTIAL/PLATELET
Basophils Absolute: 0.1 10*3/uL (ref 0.0–0.1)
Eosinophils Absolute: 0 10*3/uL (ref 0.0–0.5)
HGB: 10.3 g/dL — ABNORMAL LOW (ref 11.6–15.9)
LYMPH%: 2.7 % — ABNORMAL LOW (ref 14.0–49.7)
MCV: 94.2 fL (ref 79.5–101.0)
MONO#: 0.8 10*3/uL (ref 0.1–0.9)
MONO%: 3.8 % (ref 0.0–14.0)
NEUT#: 20.2 10*3/uL — ABNORMAL HIGH (ref 1.5–6.5)
Platelets: 218 10*3/uL (ref 145–400)

## 2009-01-09 LAB — COMPREHENSIVE METABOLIC PANEL
CO2: 27 mEq/L (ref 19–32)
Calcium: 8.3 mg/dL — ABNORMAL LOW (ref 8.4–10.5)
Chloride: 105 mEq/L (ref 96–112)
Glucose, Bld: 134 mg/dL — ABNORMAL HIGH (ref 70–99)
Sodium: 139 mEq/L (ref 135–145)
Total Bilirubin: 0.4 mg/dL (ref 0.3–1.2)
Total Protein: 5.2 g/dL — ABNORMAL LOW (ref 6.0–8.3)

## 2009-01-09 LAB — PROTHROMBIN TIME
INR: 8.4 (ref 0.0–1.5)
Prothrombin Time: 69.3 seconds — ABNORMAL HIGH (ref 11.6–15.2)

## 2009-01-12 LAB — PROTIME-INR

## 2009-01-13 ENCOUNTER — Ambulatory Visit (HOSPITAL_COMMUNITY): Admission: RE | Admit: 2009-01-13 | Discharge: 2009-01-13 | Payer: Self-pay | Admitting: Interventional Radiology

## 2009-01-13 ENCOUNTER — Ambulatory Visit: Payer: Self-pay | Admitting: Oncology

## 2009-01-17 LAB — CBC WITH DIFFERENTIAL/PLATELET
BASO%: 0.2 % (ref 0.0–2.0)
Basophils Absolute: 0 10*3/uL (ref 0.0–0.1)
EOS%: 0.3 % (ref 0.0–7.0)
HCT: 31.2 % — ABNORMAL LOW (ref 34.8–46.6)
LYMPH%: 6.7 % — ABNORMAL LOW (ref 14.0–49.7)
MCH: 31 pg (ref 25.1–34.0)
MCHC: 33 g/dL (ref 31.5–36.0)
MCV: 94 fL (ref 79.5–101.0)
MONO%: 4.8 % (ref 0.0–14.0)
NEUT%: 88 % — ABNORMAL HIGH (ref 38.4–76.8)
Platelets: 97 10*3/uL — ABNORMAL LOW (ref 145–400)
lymph#: 0.9 10*3/uL (ref 0.9–3.3)

## 2009-01-17 LAB — PROTIME-INR: INR: 1 — ABNORMAL LOW (ref 2.00–3.50)

## 2009-01-17 LAB — COMPREHENSIVE METABOLIC PANEL
ALT: 27 U/L (ref 0–35)
AST: 22 U/L (ref 0–37)
CO2: 23 mEq/L (ref 19–32)
Calcium: 8 mg/dL — ABNORMAL LOW (ref 8.4–10.5)
Chloride: 112 mEq/L (ref 96–112)
Potassium: 4.4 mEq/L (ref 3.5–5.3)
Sodium: 141 mEq/L (ref 135–145)
Total Protein: 5.3 g/dL — ABNORMAL LOW (ref 6.0–8.3)

## 2009-01-20 LAB — CBC WITH DIFFERENTIAL/PLATELET
BASO%: 0.3 % (ref 0.0–2.0)
EOS%: 0.6 % (ref 0.0–7.0)
HCT: 32.3 % — ABNORMAL LOW (ref 34.8–46.6)
LYMPH%: 7.4 % — ABNORMAL LOW (ref 14.0–49.7)
MCH: 29 pg (ref 25.1–34.0)
MCHC: 30.7 g/dL — ABNORMAL LOW (ref 31.5–36.0)
MONO#: 0.9 10*3/uL (ref 0.1–0.9)
NEUT%: 84.5 % — ABNORMAL HIGH (ref 38.4–76.8)
RBC: 3.41 10*6/uL — ABNORMAL LOW (ref 3.70–5.45)
WBC: 11.8 10*3/uL — ABNORMAL HIGH (ref 3.9–10.3)
lymph#: 0.9 10*3/uL (ref 0.9–3.3)
nRBC: 0 % (ref 0–0)

## 2009-01-20 LAB — PROTIME-INR: INR: 1 — ABNORMAL LOW (ref 2.00–3.50)

## 2009-01-25 LAB — CBC WITH DIFFERENTIAL/PLATELET
Eosinophils Absolute: 0.1 10*3/uL (ref 0.0–0.5)
HCT: 31.8 % — ABNORMAL LOW (ref 34.8–46.6)
LYMPH%: 7.3 % — ABNORMAL LOW (ref 14.0–49.7)
MONO#: 0.9 10*3/uL (ref 0.1–0.9)
NEUT#: 9.9 10*3/uL — ABNORMAL HIGH (ref 1.5–6.5)
NEUT%: 84.4 % — ABNORMAL HIGH (ref 38.4–76.8)
Platelets: 269 10*3/uL (ref 145–400)
WBC: 11.7 10*3/uL — ABNORMAL HIGH (ref 3.9–10.3)

## 2009-01-25 LAB — PROTIME-INR: Protime: 12 Seconds (ref 10.6–13.4)

## 2009-01-26 ENCOUNTER — Encounter: Payer: Self-pay | Admitting: Oncology

## 2009-01-26 ENCOUNTER — Ambulatory Visit (HOSPITAL_COMMUNITY): Admission: RE | Admit: 2009-01-26 | Discharge: 2009-01-26 | Payer: Self-pay | Admitting: Oncology

## 2009-01-27 LAB — PROTIME-INR
INR: 1.1 — ABNORMAL LOW (ref 2.00–3.50)
Protime: 13.2 Seconds (ref 10.6–13.4)

## 2009-01-31 LAB — CBC WITH DIFFERENTIAL/PLATELET
BASO%: 0.4 % (ref 0.0–2.0)
LYMPH%: 9.7 % — ABNORMAL LOW (ref 14.0–49.7)
MCHC: 31.5 g/dL (ref 31.5–36.0)
MONO#: 0.8 10*3/uL (ref 0.1–0.9)
Platelets: 275 10*3/uL (ref 145–400)
RBC: 3.69 10*6/uL — ABNORMAL LOW (ref 3.70–5.45)
RDW: 18.5 % — ABNORMAL HIGH (ref 11.2–14.5)
WBC: 9.1 10*3/uL (ref 3.9–10.3)
lymph#: 0.9 10*3/uL (ref 0.9–3.3)

## 2009-01-31 LAB — PROTIME-INR
INR: 1.1 — ABNORMAL LOW (ref 2.00–3.50)
Protime: 13.2 s (ref 10.6–13.4)

## 2009-02-10 ENCOUNTER — Ambulatory Visit: Payer: Self-pay | Admitting: Oncology

## 2009-02-10 LAB — PROTIME-INR: Protime: 25.2 Seconds — ABNORMAL HIGH (ref 10.6–13.4)

## 2009-02-14 LAB — CBC WITH DIFFERENTIAL/PLATELET
Basophils Absolute: 0 10*3/uL (ref 0.0–0.1)
Eosinophils Absolute: 0.2 10*3/uL (ref 0.0–0.5)
HGB: 10.8 g/dL — ABNORMAL LOW (ref 11.6–15.9)
LYMPH%: 9.6 % — ABNORMAL LOW (ref 14.0–49.7)
MCV: 87.4 fL (ref 79.5–101.0)
MONO%: 8.3 % (ref 0.0–14.0)
NEUT#: 6.4 10*3/uL (ref 1.5–6.5)
Platelets: 224 10*3/uL (ref 145–400)
RBC: 3.76 10*6/uL (ref 3.70–5.45)

## 2009-02-14 LAB — COMPREHENSIVE METABOLIC PANEL
AST: 17 U/L (ref 0–37)
Albumin: 3.6 g/dL (ref 3.5–5.2)
Alkaline Phosphatase: 149 U/L — ABNORMAL HIGH (ref 39–117)
BUN: 9 mg/dL (ref 6–23)
Creatinine, Ser: 0.53 mg/dL (ref 0.40–1.20)
Potassium: 4.2 mEq/L (ref 3.5–5.3)
Total Bilirubin: 0.2 mg/dL — ABNORMAL LOW (ref 0.3–1.2)

## 2009-02-14 LAB — LIPID PANEL
HDL: 86 mg/dL (ref >39)
LDL Cholesterol: 136 mg/dL — ABNORMAL HIGH (ref 0–99)
Total CHOL/HDL Ratio: 2.9 Ratio
VLDL: 30 mg/dL (ref 0–40)

## 2009-02-27 ENCOUNTER — Ambulatory Visit (HOSPITAL_COMMUNITY): Admission: RE | Admit: 2009-02-27 | Discharge: 2009-02-27 | Payer: Self-pay | Admitting: Oncology

## 2009-03-03 LAB — PROTIME-INR: INR: 1.2 — ABNORMAL LOW (ref 2.00–3.50)

## 2009-03-03 LAB — CBC WITH DIFFERENTIAL/PLATELET
BASO%: 0.2 % (ref 0.0–2.0)
Eosinophils Absolute: 0.2 10*3/uL (ref 0.0–0.5)
HCT: 34.2 % — ABNORMAL LOW (ref 34.8–46.6)
LYMPH%: 9.9 % — ABNORMAL LOW (ref 14.0–49.7)
MCHC: 32.5 g/dL (ref 31.5–36.0)
MONO#: 0.6 10*3/uL (ref 0.1–0.9)
NEUT#: 6.1 10*3/uL (ref 1.5–6.5)
NEUT%: 78.7 % — ABNORMAL HIGH (ref 38.4–76.8)
Platelets: 315 10*3/uL (ref 145–400)
WBC: 7.7 10*3/uL (ref 3.9–10.3)

## 2009-03-03 LAB — COMPREHENSIVE METABOLIC PANEL
ALT: 20 U/L (ref 0–35)
AST: 25 U/L (ref 0–37)
Albumin: 3.2 g/dL — ABNORMAL LOW (ref 3.5–5.2)
Calcium: 8.7 mg/dL (ref 8.4–10.5)
Chloride: 106 mEq/L (ref 96–112)
Potassium: 4.2 mEq/L (ref 3.5–5.3)
Total Protein: 6.4 g/dL (ref 6.0–8.3)

## 2009-03-07 ENCOUNTER — Encounter: Admission: RE | Admit: 2009-03-07 | Discharge: 2009-03-07 | Payer: Self-pay | Admitting: Oncology

## 2009-03-08 ENCOUNTER — Ambulatory Visit: Payer: Self-pay | Admitting: Oncology

## 2009-03-08 LAB — CBC WITH DIFFERENTIAL/PLATELET
BASO%: 0.4 % (ref 0.0–2.0)
EOS%: 2.5 % (ref 0.0–7.0)
HCT: 35.9 % (ref 34.8–46.6)
LYMPH%: 13.2 % — ABNORMAL LOW (ref 14.0–49.7)
MCH: 26.2 pg (ref 25.1–34.0)
MCHC: 30.9 g/dL — ABNORMAL LOW (ref 31.5–36.0)
NEUT%: 75.3 % (ref 38.4–76.8)
Platelets: 268 10*3/uL (ref 145–400)

## 2009-03-22 LAB — COMPREHENSIVE METABOLIC PANEL
Alkaline Phosphatase: 117 U/L (ref 39–117)
BUN: 9 mg/dL (ref 6–23)
Creatinine, Ser: 0.5 mg/dL (ref 0.40–1.20)
Glucose, Bld: 81 mg/dL (ref 70–99)
Sodium: 137 mEq/L (ref 135–145)
Total Bilirubin: 0.5 mg/dL (ref 0.3–1.2)

## 2009-03-22 LAB — CBC WITH DIFFERENTIAL/PLATELET
BASO%: 0.3 % (ref 0.0–2.0)
Basophils Absolute: 0 10*3/uL (ref 0.0–0.1)
HCT: 35.3 % (ref 34.8–46.6)
HGB: 11.2 g/dL — ABNORMAL LOW (ref 11.6–15.9)
LYMPH%: 11.6 % — ABNORMAL LOW (ref 14.0–49.7)
MCH: 26.3 pg (ref 25.1–34.0)
MCHC: 31.8 g/dL (ref 31.5–36.0)
MONO#: 0.7 10*3/uL (ref 0.1–0.9)
NEUT%: 76.5 % (ref 38.4–76.8)
Platelets: 242 10*3/uL (ref 145–400)
WBC: 7.9 10*3/uL (ref 3.9–10.3)

## 2009-03-22 LAB — PROTIME-INR: Protime: 16.8 Seconds — ABNORMAL HIGH (ref 10.6–13.4)

## 2009-03-29 LAB — CBC WITH DIFFERENTIAL/PLATELET
Basophils Absolute: 0 10*3/uL (ref 0.0–0.1)
Eosinophils Absolute: 0.2 10*3/uL (ref 0.0–0.5)
HGB: 11.8 g/dL (ref 11.6–15.9)
LYMPH%: 10.6 % — ABNORMAL LOW (ref 14.0–49.7)
MONO#: 0.7 10*3/uL (ref 0.1–0.9)
NEUT#: 7.8 10*3/uL — ABNORMAL HIGH (ref 1.5–6.5)
Platelets: 273 10*3/uL (ref 145–400)
RBC: 4.69 10*6/uL (ref 3.70–5.45)
WBC: 9.7 10*3/uL (ref 3.9–10.3)

## 2009-03-29 LAB — PROTIME-INR: Protime: 19.2 Seconds — ABNORMAL HIGH (ref 10.6–13.4)

## 2009-04-27 ENCOUNTER — Ambulatory Visit: Payer: Self-pay | Admitting: Oncology

## 2009-04-28 ENCOUNTER — Ambulatory Visit: Payer: Self-pay | Admitting: Surgery

## 2009-04-28 ENCOUNTER — Ambulatory Visit (HOSPITAL_COMMUNITY): Admission: RE | Admit: 2009-04-28 | Discharge: 2009-04-28 | Payer: Self-pay | Admitting: Oncology

## 2009-04-28 ENCOUNTER — Encounter: Payer: Self-pay | Admitting: Oncology

## 2009-05-01 LAB — CBC WITH DIFFERENTIAL/PLATELET
BASO%: 0.1 % (ref 0.0–2.0)
Eosinophils Absolute: 0.4 10*3/uL (ref 0.0–0.5)
LYMPH%: 9.5 % — ABNORMAL LOW (ref 14.0–49.7)
MCHC: 32.7 g/dL (ref 31.5–36.0)
MONO#: 0.8 10*3/uL (ref 0.1–0.9)
NEUT#: 7 10*3/uL — ABNORMAL HIGH (ref 1.5–6.5)
RBC: 4.53 10*6/uL (ref 3.70–5.45)
RDW: 19.5 % — ABNORMAL HIGH (ref 11.2–14.5)
WBC: 9.1 10*3/uL (ref 3.9–10.3)
lymph#: 0.9 10*3/uL (ref 0.9–3.3)

## 2009-05-01 LAB — PROTIME-INR: INR: 1 — ABNORMAL LOW (ref 2.00–3.50)

## 2009-05-04 ENCOUNTER — Ambulatory Visit (HOSPITAL_COMMUNITY): Admission: RE | Admit: 2009-05-04 | Discharge: 2009-05-04 | Payer: Self-pay | Admitting: General Surgery

## 2009-05-10 ENCOUNTER — Ambulatory Visit: Admission: RE | Admit: 2009-05-10 | Discharge: 2009-05-17 | Payer: Self-pay | Admitting: Radiation Oncology

## 2009-05-12 LAB — PROTIME-INR: INR: 1.2 — ABNORMAL LOW (ref 2.00–3.50)

## 2009-05-12 LAB — CBC WITH DIFFERENTIAL/PLATELET
Basophils Absolute: 0.1 10*3/uL (ref 0.0–0.1)
EOS%: 3.3 % (ref 0.0–7.0)
HGB: 11.5 g/dL — ABNORMAL LOW (ref 11.6–15.9)
MCH: 25.2 pg (ref 25.1–34.0)
NEUT#: 6.5 10*3/uL (ref 1.5–6.5)
RDW: 20.3 % — ABNORMAL HIGH (ref 11.2–14.5)
WBC: 8.8 10*3/uL (ref 3.9–10.3)
lymph#: 1 10*3/uL (ref 0.9–3.3)

## 2009-05-16 LAB — COMPREHENSIVE METABOLIC PANEL
ALT: 27 U/L (ref 0–35)
AST: 21 U/L (ref 0–37)
Calcium: 8.8 mg/dL (ref 8.4–10.5)
Chloride: 104 mEq/L (ref 96–112)
Creatinine, Ser: 0.57 mg/dL (ref 0.40–1.20)
Total Bilirubin: 0.2 mg/dL — ABNORMAL LOW (ref 0.3–1.2)

## 2009-05-16 LAB — IMMUNOFIXATION ELECTROPHORESIS
IgA: 175 mg/dL (ref 68–378)
IgG (Immunoglobin G), Serum: 664 mg/dL — ABNORMAL LOW (ref 694–1618)
IgM, Serum: 186 mg/dL (ref 60–263)
Total Protein, Serum Electrophoresis: 6.6 g/dL (ref 6.0–8.3)

## 2009-05-27 HISTORY — PX: BONE MARROW TRANSPLANT: SHX200

## 2009-06-05 ENCOUNTER — Ambulatory Visit: Payer: Self-pay | Admitting: Oncology

## 2009-06-07 ENCOUNTER — Ambulatory Visit: Payer: Self-pay | Admitting: Oncology

## 2009-06-07 ENCOUNTER — Inpatient Hospital Stay (HOSPITAL_COMMUNITY): Admission: AD | Admit: 2009-06-07 | Discharge: 2009-06-12 | Payer: Self-pay | Admitting: Oncology

## 2009-06-07 LAB — PROTIME-INR
INR: 3.2 (ref 2.00–3.50)
Protime: 38.4 Seconds — ABNORMAL HIGH (ref 10.6–13.4)

## 2009-06-07 LAB — CBC WITH DIFFERENTIAL/PLATELET
Eosinophils Absolute: 0.3 10*3/uL (ref 0.0–0.5)
HGB: 11.7 g/dL (ref 11.6–15.9)
MONO#: 0.9 10*3/uL (ref 0.1–0.9)
NEUT#: 7.6 10*3/uL — ABNORMAL HIGH (ref 1.5–6.5)
RBC: 4.66 10*6/uL (ref 3.70–5.45)
RDW: 21.3 % — ABNORMAL HIGH (ref 11.2–14.5)
WBC: 9.9 10*3/uL (ref 3.9–10.3)
lymph#: 1 10*3/uL (ref 0.9–3.3)

## 2009-06-16 LAB — CBC WITH DIFFERENTIAL/PLATELET
BASO%: 1.4 % (ref 0.0–2.0)
Basophils Absolute: 0.1 10*3/uL (ref 0.0–0.1)
EOS%: 0.9 % (ref 0.0–7.0)
LYMPH%: 19 % (ref 14.0–49.7)
MCH: 25.8 pg (ref 25.1–34.0)
MCHC: 32.1 g/dL (ref 31.5–36.0)
NEUT#: 2.7 10*3/uL (ref 1.5–6.5)
NEUT%: 77 % — ABNORMAL HIGH (ref 38.4–76.8)
WBC: 3.5 10*3/uL — ABNORMAL LOW (ref 3.9–10.3)

## 2009-06-16 LAB — PROTIME-INR: INR: 1.5 — ABNORMAL LOW (ref 2.00–3.50)

## 2009-06-16 LAB — COMPREHENSIVE METABOLIC PANEL
AST: 25 U/L (ref 0–37)
Albumin: 3.4 g/dL — ABNORMAL LOW (ref 3.5–5.2)
BUN: 9 mg/dL (ref 6–23)
Calcium: 8.2 mg/dL — ABNORMAL LOW (ref 8.4–10.5)
Chloride: 105 mEq/L (ref 96–112)
Creatinine, Ser: 0.54 mg/dL (ref 0.40–1.20)
Glucose, Bld: 74 mg/dL (ref 70–99)
Potassium: 3.7 mEq/L (ref 3.5–5.3)

## 2009-06-20 ENCOUNTER — Ambulatory Visit: Admission: RE | Admit: 2009-06-20 | Discharge: 2009-06-20 | Payer: Self-pay | Admitting: Oncology

## 2009-06-20 LAB — COMPREHENSIVE METABOLIC PANEL
ALT: 33 U/L (ref 0–35)
AST: 27 U/L (ref 0–37)
Alkaline Phosphatase: 120 U/L — ABNORMAL HIGH (ref 39–117)
CO2: 24 mEq/L (ref 19–32)
Creatinine, Ser: 0.46 mg/dL (ref 0.40–1.20)
Sodium: 138 mEq/L (ref 135–145)
Total Bilirubin: 0.3 mg/dL (ref 0.3–1.2)
Total Protein: 6.1 g/dL (ref 6.0–8.3)

## 2009-06-20 LAB — CBC WITH DIFFERENTIAL/PLATELET
BASO%: 0.2 % (ref 0.0–2.0)
EOS%: 0 % (ref 0.0–7.0)
HCT: 40.1 % (ref 34.8–46.6)
LYMPH%: 6.9 % — ABNORMAL LOW (ref 14.0–49.7)
MCH: 26.7 pg (ref 25.1–34.0)
MCHC: 32.8 g/dL (ref 31.5–36.0)
MCV: 81.3 fL (ref 79.5–101.0)
MONO%: 5.4 % (ref 0.0–14.0)
NEUT%: 87.5 % — ABNORMAL HIGH (ref 38.4–76.8)
Platelets: <6 10*3/uL — CL (ref 145–400)
RBC: 4.94 10*6/uL (ref 3.70–5.45)
WBC: 13.8 10*3/uL — ABNORMAL HIGH (ref 3.9–10.3)

## 2009-06-20 LAB — URINALYSIS, MICROSCOPIC - CHCC
Bilirubin (Urine): NEGATIVE
Glucose: NEGATIVE g/dL
Ketones: NEGATIVE mg/dL
Leukocyte Esterase: NEGATIVE

## 2009-06-20 LAB — LACTATE DEHYDROGENASE: LDH: 251 U/L — ABNORMAL HIGH (ref 94–250)

## 2009-06-20 LAB — PROTIME-INR: Protime: 14.4 Seconds — ABNORMAL HIGH (ref 10.6–13.4)

## 2009-06-22 LAB — URINE CULTURE

## 2009-06-23 LAB — PROTIME-INR
INR: 1.5 — ABNORMAL LOW (ref 2.00–3.50)
Protime: 18 Seconds — ABNORMAL HIGH (ref 10.6–13.4)

## 2009-06-23 LAB — CBC WITH DIFFERENTIAL/PLATELET
BASO%: 2.2 % — ABNORMAL HIGH (ref 0.0–2.0)
Basophils Absolute: 0.4 10*3/uL — ABNORMAL HIGH (ref 0.0–0.1)
EOS%: 0 % (ref 0.0–7.0)
HGB: 11.9 g/dL (ref 11.6–15.9)
MCH: 25.8 pg (ref 25.1–34.0)
MCHC: 32.1 g/dL (ref 31.5–36.0)
MONO#: 2 10*3/uL — ABNORMAL HIGH (ref 0.1–0.9)
RDW: 21.2 % — ABNORMAL HIGH (ref 11.2–14.5)
WBC: 16.1 10*3/uL — ABNORMAL HIGH (ref 3.9–10.3)
lymph#: 1 10*3/uL (ref 0.9–3.3)

## 2009-06-27 LAB — CBC WITH DIFFERENTIAL/PLATELET
Basophils Absolute: 0 10*3/uL (ref 0.0–0.1)
Eosinophils Absolute: 0 10*3/uL (ref 0.0–0.5)
HCT: 38.6 % (ref 34.8–46.6)
HGB: 12.5 g/dL (ref 11.6–15.9)
MONO#: 0.8 10*3/uL (ref 0.1–0.9)
NEUT%: 74.7 % (ref 38.4–76.8)
WBC: 7.9 10*3/uL (ref 3.9–10.3)
lymph#: 1.1 10*3/uL (ref 0.9–3.3)

## 2009-06-27 LAB — URINALYSIS, MICROSCOPIC - CHCC
Ketones: NEGATIVE mg/dL
Protein: NEGATIVE mg/dL
pH: 5 (ref 4.6–8.0)

## 2009-06-27 LAB — COMPREHENSIVE METABOLIC PANEL
ALT: 32 U/L (ref 0–35)
BUN: 11 mg/dL (ref 6–23)
CO2: 30 mEq/L (ref 19–32)
Calcium: 8.7 mg/dL (ref 8.4–10.5)
Chloride: 104 mEq/L (ref 96–112)
Creatinine, Ser: 0.57 mg/dL (ref 0.40–1.20)
Glucose, Bld: 90 mg/dL (ref 70–99)

## 2009-06-27 LAB — PROTIME-INR
INR: 1.5 — ABNORMAL LOW (ref 2.00–3.50)
Protime: 18 Seconds — ABNORMAL HIGH (ref 10.6–13.4)

## 2009-06-27 LAB — LACTATE DEHYDROGENASE: LDH: 135 U/L (ref 94–250)

## 2009-06-28 ENCOUNTER — Inpatient Hospital Stay (HOSPITAL_COMMUNITY): Admission: AD | Admit: 2009-06-28 | Discharge: 2009-07-03 | Payer: Self-pay | Admitting: Oncology

## 2009-06-28 ENCOUNTER — Ambulatory Visit: Payer: Self-pay | Admitting: Oncology

## 2009-07-01 ENCOUNTER — Ambulatory Visit: Payer: Self-pay | Admitting: Hematology & Oncology

## 2009-07-06 ENCOUNTER — Ambulatory Visit: Payer: Self-pay | Admitting: Oncology

## 2009-07-06 LAB — CBC WITH DIFFERENTIAL/PLATELET
BASO%: 0.6 % (ref 0.0–2.0)
Basophils Absolute: 0.1 10*3/uL (ref 0.0–0.1)
EOS%: 0 % (ref 0.0–7.0)
HGB: 12 g/dL (ref 11.6–15.9)
MCH: 25.8 pg (ref 25.1–34.0)
MCV: 77.8 fL — ABNORMAL LOW (ref 79.5–101.0)
MONO%: 0.2 % (ref 0.0–14.0)
RBC: 4.65 10*6/uL (ref 3.70–5.45)
RDW: 20 % — ABNORMAL HIGH (ref 11.2–14.5)
lymph#: 0.3 10*3/uL — ABNORMAL LOW (ref 0.9–3.3)

## 2009-07-06 LAB — PROTIME-INR
INR: 1.3 — ABNORMAL LOW (ref 2.00–3.50)
Protime: 15.6 Seconds — ABNORMAL HIGH (ref 10.6–13.4)

## 2009-07-06 LAB — COMPREHENSIVE METABOLIC PANEL
ALT: 72 U/L — ABNORMAL HIGH (ref 0–35)
AST: 47 U/L — ABNORMAL HIGH (ref 0–37)
Alkaline Phosphatase: 104 U/L (ref 39–117)
BUN: 10 mg/dL (ref 6–23)
Calcium: 8.4 mg/dL (ref 8.4–10.5)
Creatinine, Ser: 0.49 mg/dL (ref 0.40–1.20)
Total Bilirubin: 0.9 mg/dL (ref 0.3–1.2)

## 2009-07-07 ENCOUNTER — Ambulatory Visit (HOSPITAL_COMMUNITY): Admission: RE | Admit: 2009-07-07 | Discharge: 2009-07-07 | Payer: Self-pay | Admitting: Urology

## 2009-07-09 ENCOUNTER — Emergency Department (HOSPITAL_COMMUNITY): Admission: EM | Admit: 2009-07-09 | Discharge: 2009-07-09 | Payer: Self-pay | Admitting: Emergency Medicine

## 2009-07-10 ENCOUNTER — Encounter (HOSPITAL_COMMUNITY): Admission: RE | Admit: 2009-07-10 | Discharge: 2009-10-08 | Payer: Self-pay | Admitting: Oncology

## 2009-07-10 LAB — CBC WITH DIFFERENTIAL/PLATELET
Basophils Absolute: 0 10*3/uL (ref 0.0–0.1)
Eosinophils Absolute: 0 10*3/uL (ref 0.0–0.5)
HCT: 31.3 % — ABNORMAL LOW (ref 34.8–46.6)
HGB: 10.5 g/dL — ABNORMAL LOW (ref 11.6–15.9)
MCH: 26.5 pg (ref 25.1–34.0)
MCV: 79 fL — ABNORMAL LOW (ref 79.5–101.0)
MONO%: 19.9 % — ABNORMAL HIGH (ref 0.0–14.0)
NEUT#: 1.5 10*3/uL (ref 1.5–6.5)
NEUT%: 52.4 % (ref 38.4–76.8)
Platelets: <6 10*3/uL — CL (ref 145–400)
RDW: 21.3 % — ABNORMAL HIGH (ref 11.2–14.5)

## 2009-07-11 LAB — COMPREHENSIVE METABOLIC PANEL
ALT: 34 U/L (ref 0–35)
AST: 20 U/L (ref 0–37)
Albumin: 3.9 g/dL (ref 3.5–5.2)
Calcium: 8.8 mg/dL (ref 8.4–10.5)
Chloride: 102 mEq/L (ref 96–112)
Potassium: 4.1 mEq/L (ref 3.5–5.3)
Total Protein: 6.2 g/dL (ref 6.0–8.3)

## 2009-07-11 LAB — CBC & DIFF AND RETIC
Basophils Absolute: 0.2 10*3/uL — ABNORMAL HIGH (ref 0.0–0.1)
EOS%: 0.1 % (ref 0.0–7.0)
Eosinophils Absolute: 0 10*3/uL (ref 0.0–0.5)
LYMPH%: 10.8 % — ABNORMAL LOW (ref 14.0–49.7)
MCH: 26.3 pg (ref 25.1–34.0)
MCV: 79.3 fL — ABNORMAL LOW (ref 79.5–101.0)
MONO%: 21.1 % — ABNORMAL HIGH (ref 0.0–14.0)
NEUT#: 9.4 10*3/uL — ABNORMAL HIGH (ref 1.5–6.5)
Platelets: <6 10*3/uL — CL (ref 145–400)
RBC: 3.96 10*6/uL (ref 3.70–5.45)

## 2009-07-11 LAB — PROTIME-INR: INR: 2.7 (ref 2.00–3.50)

## 2009-07-12 LAB — CBC WITH DIFFERENTIAL/PLATELET
BASO%: 0.4 % (ref 0.0–2.0)
Eosinophils Absolute: 0 10*3/uL (ref 0.0–0.5)
HCT: 30.1 % — ABNORMAL LOW (ref 34.8–46.6)
LYMPH%: 3.9 % — ABNORMAL LOW (ref 14.0–49.7)
MCHC: 34.2 g/dL (ref 31.5–36.0)
MCV: 80.3 fL (ref 79.5–101.0)
MONO#: 2.5 10*3/uL — ABNORMAL HIGH (ref 0.1–0.9)
NEUT%: 88.4 % — ABNORMAL HIGH (ref 38.4–76.8)
Platelets: 6 10*3/uL — CL (ref 145–400)
WBC: 34 10*3/uL — ABNORMAL HIGH (ref 3.9–10.3)

## 2009-07-13 LAB — CBC WITH DIFFERENTIAL/PLATELET
BASO%: 0.6 % (ref 0.0–2.0)
EOS%: 0.1 % (ref 0.0–7.0)
HCT: 28.9 % — ABNORMAL LOW (ref 34.8–46.6)
LYMPH%: 5.5 % — ABNORMAL LOW (ref 14.0–49.7)
MCH: 27.7 pg (ref 25.1–34.0)
MCHC: 34.5 g/dL (ref 31.5–36.0)
MONO%: 4.6 % (ref 0.0–14.0)
NEUT%: 89.2 % — ABNORMAL HIGH (ref 38.4–76.8)
Platelets: <6 10*3/uL — CL (ref 145–400)

## 2009-07-14 ENCOUNTER — Ambulatory Visit (HOSPITAL_COMMUNITY): Admission: RE | Admit: 2009-07-14 | Discharge: 2009-07-14 | Payer: Self-pay | Admitting: Oncology

## 2009-07-14 LAB — VITAMIN D 25 HYDROXY (VIT D DEFICIENCY, FRACTURES): Vit D, 25-Hydroxy: 20 ng/mL — ABNORMAL LOW (ref 30–89)

## 2009-07-14 LAB — CBC WITH DIFFERENTIAL/PLATELET
BASO%: 0.9 % (ref 0.0–2.0)
EOS%: 0 % (ref 0.0–7.0)
MCH: 25.9 pg (ref 25.1–34.0)
MCHC: 32.7 g/dL (ref 31.5–36.0)
NEUT%: 83.9 % — ABNORMAL HIGH (ref 38.4–76.8)
RDW: 20.7 % — ABNORMAL HIGH (ref 11.2–14.5)
lymph#: 1.4 10*3/uL (ref 0.9–3.3)

## 2009-07-14 LAB — HOLD TUBE, BLOOD BANK

## 2009-07-17 LAB — CBC WITH DIFFERENTIAL/PLATELET
Basophils Absolute: 0.2 10*3/uL — ABNORMAL HIGH (ref 0.0–0.1)
Eosinophils Absolute: 0 10*3/uL (ref 0.0–0.5)
HCT: 29.6 % — ABNORMAL LOW (ref 34.8–46.6)
LYMPH%: 3.7 % — ABNORMAL LOW (ref 14.0–49.7)
MCV: 81.2 fL (ref 79.5–101.0)
MONO#: 0.1 10*3/uL (ref 0.1–0.9)
RBC: 3.65 10*6/uL — ABNORMAL LOW (ref 3.70–5.45)
RDW: 22.2 % — ABNORMAL HIGH (ref 11.2–14.5)

## 2009-07-17 LAB — HOLD TUBE, BLOOD BANK

## 2009-07-24 LAB — CBC WITH DIFFERENTIAL/PLATELET
Basophils Absolute: 0 10*3/uL (ref 0.0–0.1)
HCT: 31.4 % — ABNORMAL LOW (ref 34.8–46.6)
HGB: 10.3 g/dL — ABNORMAL LOW (ref 11.6–15.9)
LYMPH%: 7.3 % — ABNORMAL LOW (ref 14.0–49.7)
MONO#: 1.3 10*3/uL — ABNORMAL HIGH (ref 0.1–0.9)
NEUT%: 83.4 % — ABNORMAL HIGH (ref 38.4–76.8)
Platelets: 444 10*3/uL — ABNORMAL HIGH (ref 145–400)
WBC: 15 10*3/uL — ABNORMAL HIGH (ref 3.9–10.3)
lymph#: 1.1 10*3/uL (ref 0.9–3.3)

## 2009-07-27 ENCOUNTER — Ambulatory Visit (HOSPITAL_COMMUNITY): Admission: RE | Admit: 2009-07-27 | Discharge: 2009-07-27 | Payer: Self-pay | Admitting: Oncology

## 2009-07-31 LAB — PROTIME-INR
INR: 1 — ABNORMAL LOW (ref 2.00–3.50)
Protime: 12 Seconds (ref 10.6–13.4)

## 2009-07-31 LAB — CBC WITH DIFFERENTIAL/PLATELET
Basophils Absolute: 0 10*3/uL (ref 0.0–0.1)
Eosinophils Absolute: 0.1 10*3/uL (ref 0.0–0.5)
HCT: 35.3 % (ref 34.8–46.6)
HGB: 11.2 g/dL — ABNORMAL LOW (ref 11.6–15.9)
MCH: 27.7 pg (ref 25.1–34.0)
MCV: 87.2 fL (ref 79.5–101.0)
MONO%: 11.4 % (ref 0.0–14.0)
NEUT#: 10 10*3/uL — ABNORMAL HIGH (ref 1.5–6.5)
NEUT%: 76.5 % (ref 38.4–76.8)
RDW: 26.5 % — ABNORMAL HIGH (ref 11.2–14.5)

## 2009-08-01 ENCOUNTER — Other Ambulatory Visit: Admission: RE | Admit: 2009-08-01 | Discharge: 2009-08-01 | Payer: Self-pay | Admitting: Family Medicine

## 2009-08-07 ENCOUNTER — Ambulatory Visit: Payer: Self-pay | Admitting: Oncology

## 2009-08-07 LAB — HOLD TUBE, BLOOD BANK

## 2009-08-07 LAB — CBC WITH DIFFERENTIAL/PLATELET
Basophils Absolute: 0 10*3/uL (ref 0.0–0.1)
Eosinophils Absolute: 0.3 10*3/uL (ref 0.0–0.5)
HGB: 11.8 g/dL (ref 11.6–15.9)
MONO#: 1 10*3/uL — ABNORMAL HIGH (ref 0.1–0.9)
NEUT#: 8.9 10*3/uL — ABNORMAL HIGH (ref 1.5–6.5)
Platelets: 139 10*3/uL — ABNORMAL LOW (ref 145–400)
RBC: 4.03 10*6/uL (ref 3.70–5.45)
RDW: 26.2 % — ABNORMAL HIGH (ref 11.2–14.5)
WBC: 11.2 10*3/uL — ABNORMAL HIGH (ref 3.9–10.3)

## 2009-09-15 ENCOUNTER — Ambulatory Visit: Payer: Self-pay | Admitting: Oncology

## 2009-10-05 LAB — CBC WITH DIFFERENTIAL/PLATELET
BASO%: 0.3 % (ref 0.0–2.0)
Eosinophils Absolute: 0.1 10*3/uL (ref 0.0–0.5)
HCT: 31.5 % — ABNORMAL LOW (ref 34.8–46.6)
LYMPH%: 23.7 % (ref 14.0–49.7)
MCHC: 34.8 g/dL (ref 31.5–36.0)
MONO#: 1.1 10*3/uL — ABNORMAL HIGH (ref 0.1–0.9)
NEUT#: 3.4 10*3/uL (ref 1.5–6.5)
Platelets: 181 10*3/uL (ref 145–400)
RBC: 3.43 10*6/uL — ABNORMAL LOW (ref 3.70–5.45)
WBC: 6 10*3/uL (ref 3.9–10.3)
lymph#: 1.4 10*3/uL (ref 0.9–3.3)

## 2009-10-05 LAB — COMPREHENSIVE METABOLIC PANEL
ALT: 18 U/L (ref 0–35)
AST: 15 U/L (ref 0–37)
Albumin: 3.5 g/dL (ref 3.5–5.2)
CO2: 21 mEq/L (ref 19–32)
Calcium: 8.9 mg/dL (ref 8.4–10.5)
Chloride: 105 mEq/L (ref 96–112)
Creatinine, Ser: 0.61 mg/dL (ref 0.40–1.20)
Potassium: 3.4 mEq/L — ABNORMAL LOW (ref 3.5–5.3)
Sodium: 141 mEq/L (ref 135–145)
Total Protein: 6.9 g/dL (ref 6.0–8.3)

## 2009-10-05 LAB — LACTATE DEHYDROGENASE: LDH: 138 U/L (ref 94–250)

## 2009-10-05 LAB — PHOSPHORUS: Phosphorus: 3.1 mg/dL (ref 2.3–4.6)

## 2009-11-02 ENCOUNTER — Ambulatory Visit: Payer: Self-pay | Admitting: Oncology

## 2009-11-06 ENCOUNTER — Ambulatory Visit: Payer: Self-pay | Admitting: Oncology

## 2009-11-06 LAB — CBC WITH DIFFERENTIAL/PLATELET
Eosinophils Absolute: 0.2 10*3/uL (ref 0.0–0.5)
HCT: 38.8 % (ref 34.8–46.6)
HGB: 12.9 g/dL (ref 11.6–15.9)
LYMPH%: 14 % (ref 14.0–49.7)
MONO#: 0.7 10*3/uL (ref 0.1–0.9)
NEUT#: 6.3 10*3/uL (ref 1.5–6.5)
NEUT%: 75.2 % (ref 38.4–76.8)
Platelets: 222 10*3/uL (ref 145–400)
WBC: 8.4 10*3/uL (ref 3.9–10.3)
lymph#: 1.2 10*3/uL (ref 0.9–3.3)

## 2009-11-06 LAB — COMPREHENSIVE METABOLIC PANEL
CO2: 20 mEq/L (ref 19–32)
Calcium: 8.9 mg/dL (ref 8.4–10.5)
Chloride: 106 mEq/L (ref 96–112)
Creatinine, Ser: 0.63 mg/dL (ref 0.40–1.20)
Glucose, Bld: 136 mg/dL — ABNORMAL HIGH (ref 70–99)
Total Bilirubin: 0.4 mg/dL (ref 0.3–1.2)
Total Protein: 6.8 g/dL (ref 6.0–8.3)

## 2009-11-24 ENCOUNTER — Ambulatory Visit (HOSPITAL_COMMUNITY): Admission: RE | Admit: 2009-11-24 | Discharge: 2009-11-24 | Payer: Self-pay | Admitting: Oncology

## 2009-12-04 LAB — CBC WITH DIFFERENTIAL/PLATELET
Basophils Absolute: 0 10*3/uL (ref 0.0–0.1)
Eosinophils Absolute: 0.2 10*3/uL (ref 0.0–0.5)
HCT: 39.4 % (ref 34.8–46.6)
LYMPH%: 11.3 % — ABNORMAL LOW (ref 14.0–49.7)
MCV: 94.5 fL (ref 79.5–101.0)
MONO%: 6.4 % (ref 0.0–14.0)
NEUT#: 5.8 10*3/uL (ref 1.5–6.5)
NEUT%: 79.2 % — ABNORMAL HIGH (ref 38.4–76.8)
Platelets: 190 10*3/uL (ref 145–400)
RBC: 4.17 10*6/uL (ref 3.70–5.45)

## 2009-12-04 LAB — COMPREHENSIVE METABOLIC PANEL
Alkaline Phosphatase: 99 U/L (ref 39–117)
BUN: 13 mg/dL (ref 6–23)
Creatinine, Ser: 0.63 mg/dL (ref 0.40–1.20)
Glucose, Bld: 100 mg/dL — ABNORMAL HIGH (ref 70–99)
Sodium: 140 mEq/L (ref 135–145)
Total Bilirubin: 0.3 mg/dL (ref 0.3–1.2)

## 2009-12-04 LAB — LACTATE DEHYDROGENASE: LDH: 93 U/L — ABNORMAL LOW (ref 94–250)

## 2009-12-07 ENCOUNTER — Ambulatory Visit: Payer: Self-pay | Admitting: Oncology

## 2010-05-24 ENCOUNTER — Ambulatory Visit: Payer: Self-pay | Admitting: Oncology

## 2010-05-30 ENCOUNTER — Ambulatory Visit (HOSPITAL_COMMUNITY)
Admission: RE | Admit: 2010-05-30 | Discharge: 2010-05-30 | Payer: Self-pay | Source: Home / Self Care | Attending: Oncology | Admitting: Oncology

## 2010-05-30 LAB — GLUCOSE, CAPILLARY: Glucose-Capillary: 93 mg/dL (ref 70–99)

## 2010-06-16 ENCOUNTER — Other Ambulatory Visit: Payer: Self-pay | Admitting: Oncology

## 2010-06-16 DIAGNOSIS — C8590 Non-Hodgkin lymphoma, unspecified, unspecified site: Secondary | ICD-10-CM

## 2010-06-17 ENCOUNTER — Encounter: Payer: Self-pay | Admitting: Oncology

## 2010-06-18 ENCOUNTER — Encounter: Payer: Self-pay | Admitting: Oncology

## 2010-08-02 IMAGING — PT NM PET TUM IMG RESTAG (PS) SKULL BASE T - THIGH
7 series · 25 of 25 positions shown · non-contrast
Comparison: 04/28/2009

CLINICAL DATA: Subsequent treatment strategy for Hodgkin's
lymphoma.

NUCLEAR MEDICINE PET CT RESTAGING (PS) SKULL BASE TO THIGH
TECHNIQUE: 16.2 mCi F-18 FDG was injected intravenously via the
right Port-A-Cath.  Full-ring PET imaging was performed from the
skull base through the mid-thighs 57  minutes after injection.  CT
data was obtained and used for attenuation correction and anatomic
localization only.  (This was not acquired as a diagnostic CT
examination.)
Fasting Blood Glucose:  89

[Series 1: pet ac · axial · 3.3mm · 4.69mm/px · z∈[-733,-7]mm · 5 of 223 slices shown]
[im 1/223]
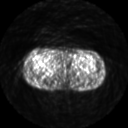
[im 56/223]
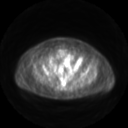
[im 112/223]
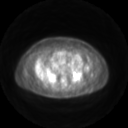
[im 167/223]
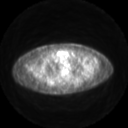
[im 223/223]
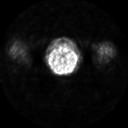

[Series 2: ct images · axial · 3.8mm · 0.98mm/px · z∈[-733,-7]mm · 5 of 223 slices shown]
[im 1/223]
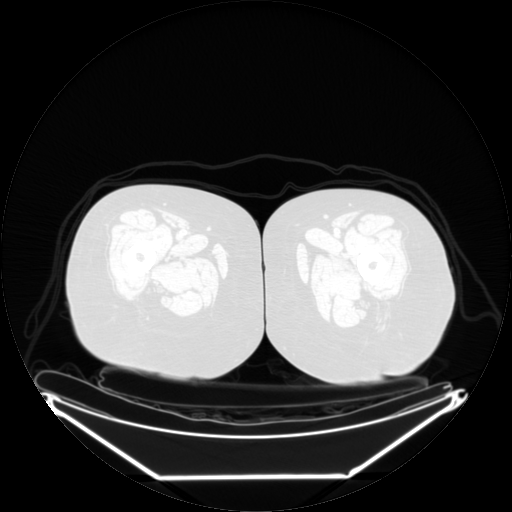
[im 56/223]
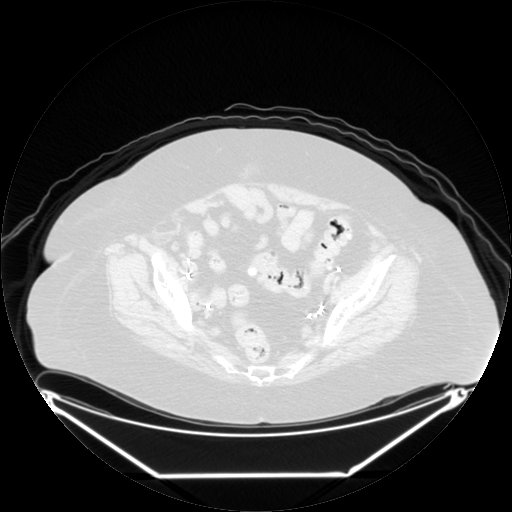
[im 112/223]
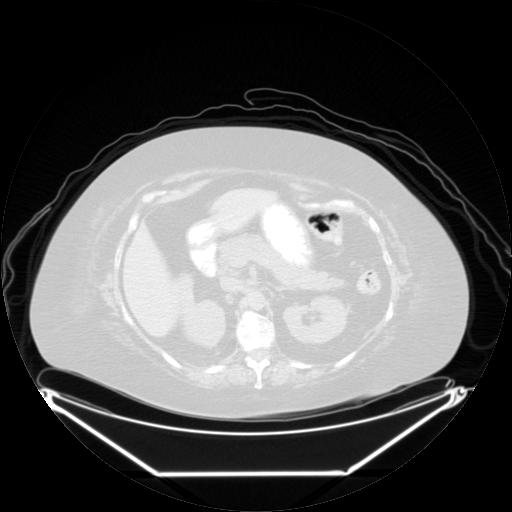
[im 167/223]
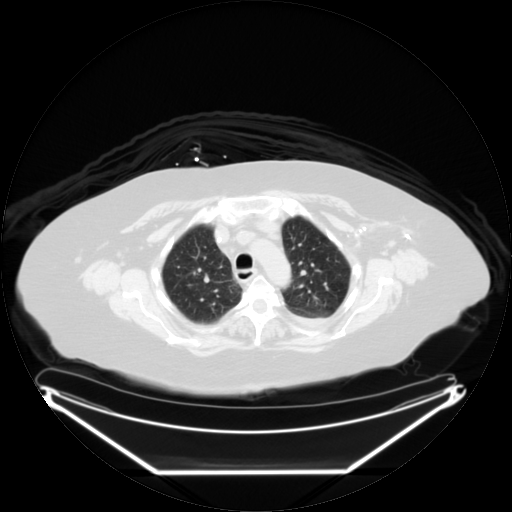
[im 223/223  brain]
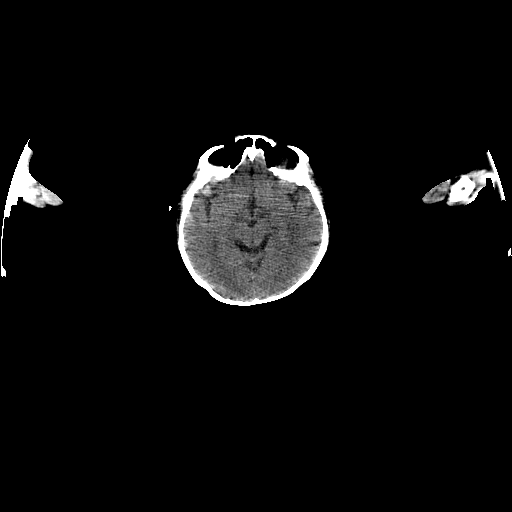

[Series 2: pet nac · axial · 3.3mm · 4.69mm/px · z∈[-733,-7]mm · 5 of 223 slices shown]
[im 1/223]
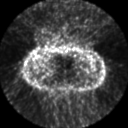
[im 56/223]
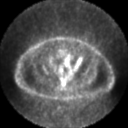
[im 112/223]
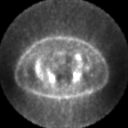
[im 167/223]
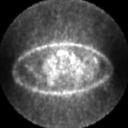
[im 223/223]
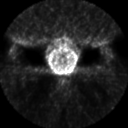

[Series 123: mip · coronal · 3.3mm · 4.69mm/px · 1 of 30 slices shown]
[im 1/30]
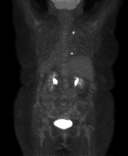

[Series 150: reformatted · axial · 3.3mm · 1.02mm/px · 1 of 1 slices shown (1 of 3)]
[im 1/1]
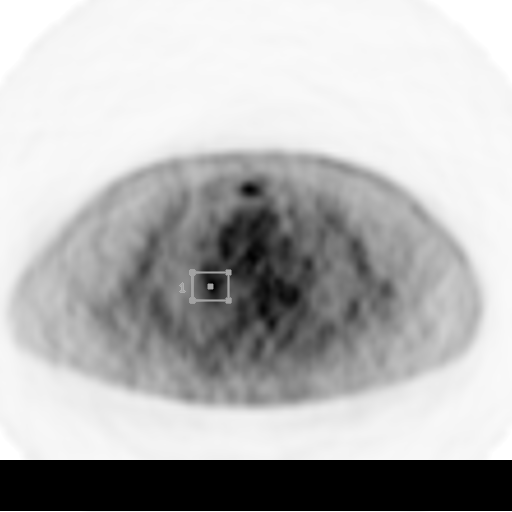

[Series 151: reformatted · axial · 3.3mm · 3.91mm/px · z∈[-733,-7]mm · 6 of 223 slices shown (2 of 3)]
[im 1/223]
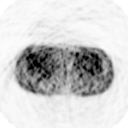
[im 45/223]
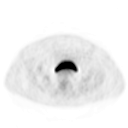
[im 89/223]
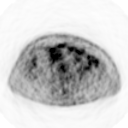
[im 134/223]
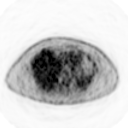
[im 178/223]
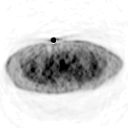
[im 223/223]
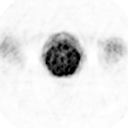

[Series 153: reformatted · coronal · 4.7mm · 5.83mm/px · 2 of 74 slices shown (3 of 3)]
[im 1/74]
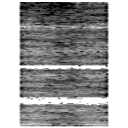
[im 74/74]
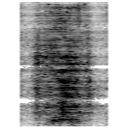

[25 of 25 positions shown; findings below may reference images not displayed]

FINDINGS: Previously seen hypermetabolic adenopathy in the left
axillary and bilateral supraclavicular regions has resolved.
Significant decrease in hypermetabolic activity is seen in mild
right hilar adenopathy, which currently has in maximum SUV of
compared with 4.7 on prior exam.

No other hypermetabolic adenopathy or soft tissue masses are
identified within the neck, chest, abdomen, or pelvis.
Hypermetabolic activity is noted at the tip of the Port-A-Cath in
the distal SVC.  Diffuse increase in metabolic activity is seen
throughout the bone marrow, consistent with hypermetabolic response
to therapy.
IMPRESSION: 1.  Resolution of the hypermetabolic left axillary and bilateral
supraclavicular adenopathy.  Significant decrease in hypermetabolic
activity in the right hilar region.
2.  No evidence of new or progressive lymphoma.

## 2010-08-02 IMAGING — CT CT CHEST W/ CM
2 of 3 series · 15 of 36 positions shown, 18 images · IV contrast (agent unspecified)
Comparison: 02/27/2009

CLINICAL DATA: Hodgkin's disease.  Ongoing chemotherapy.  Assess
treatment response.

CT CHEST WITH CONTRAST
TECHNIQUE: Multidetector CT imaging of the chest was performed
following the standard protocol during bolus administration of
intravenous contrast.
Contrast: 80 ml [id number]

[Series 2: chest with st · axial · 0.81mm/px · z∈[-284,-50]mm · 12 of 57 slices shown, 15 images]
[im 5/57  mediastinal]
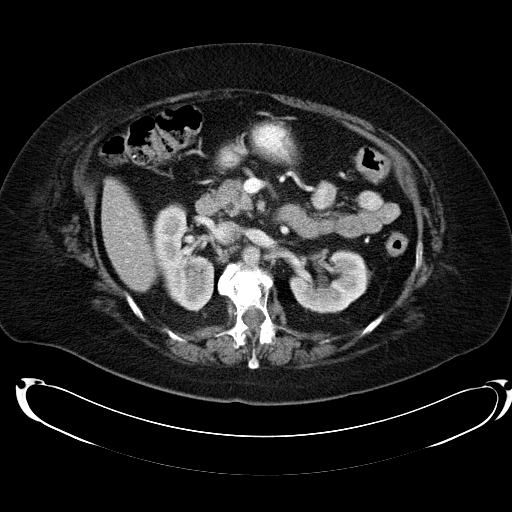
[im 5/57  lung]
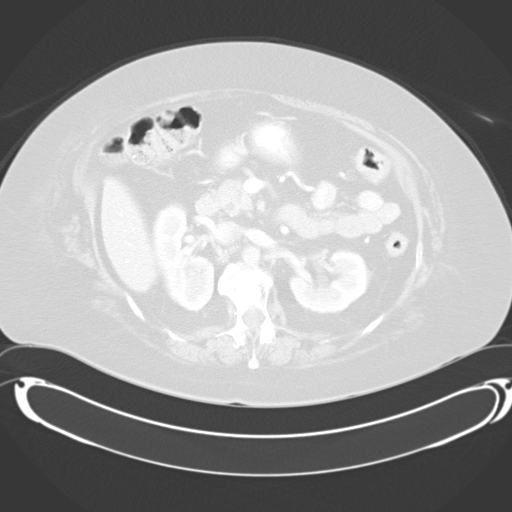
[im 9/57  lung]
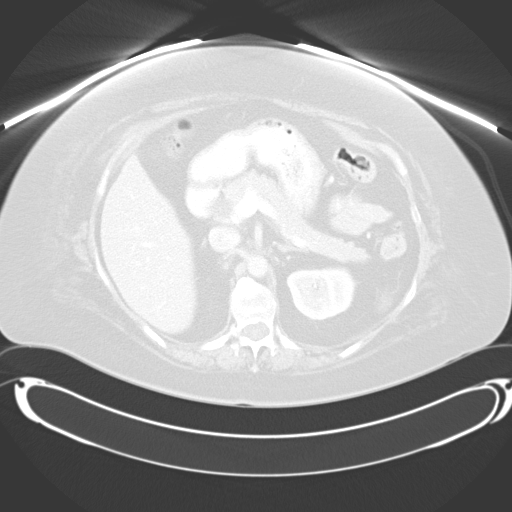
[im 13/57  lung]
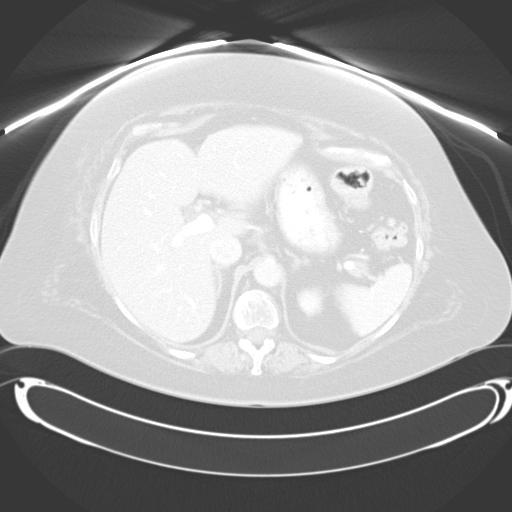
[im 17/57  lung]
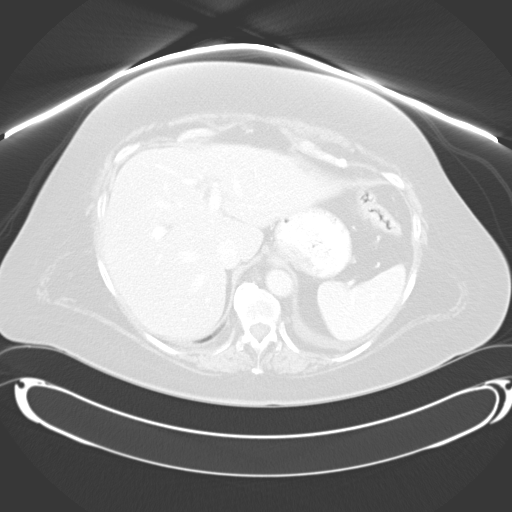
[im 21/57  mediastinal]
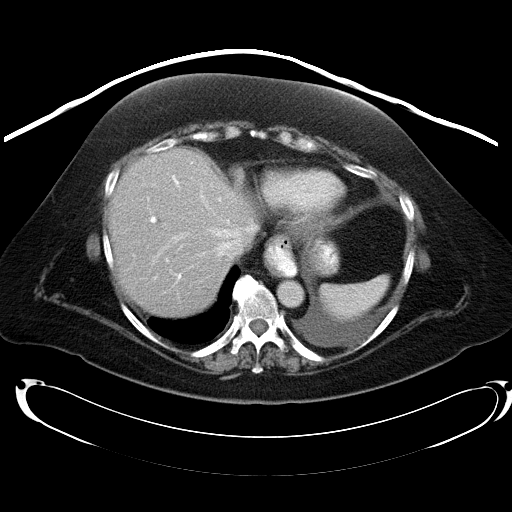
[im 21/57  lung]
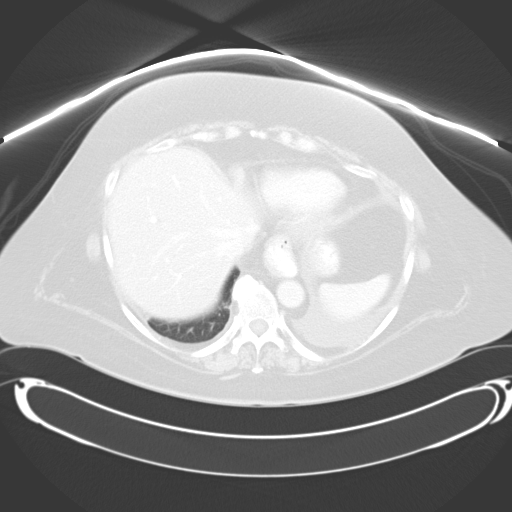
[im 25/57  lung]
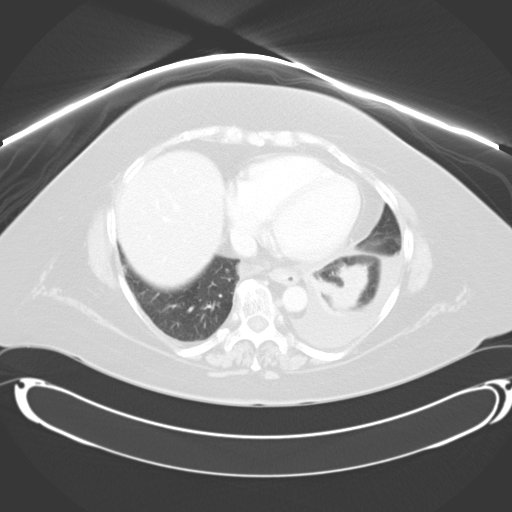
[im 32/57  lung]
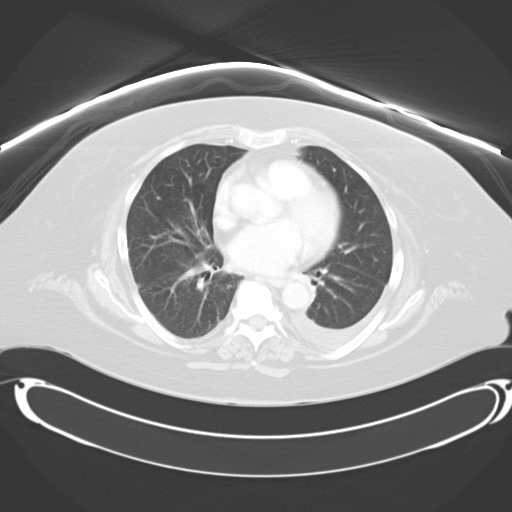
[im 36/57  lung]
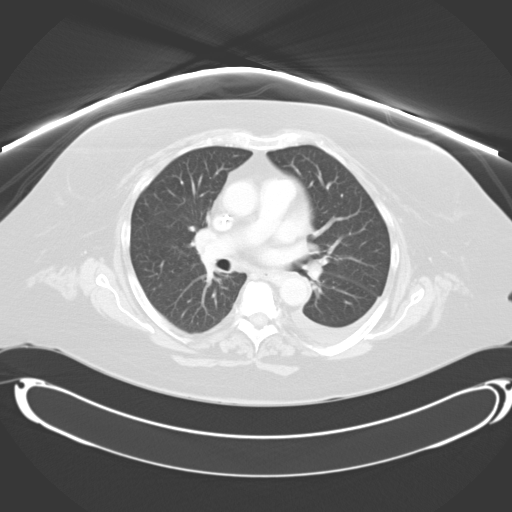
[im 40/57  mediastinal]
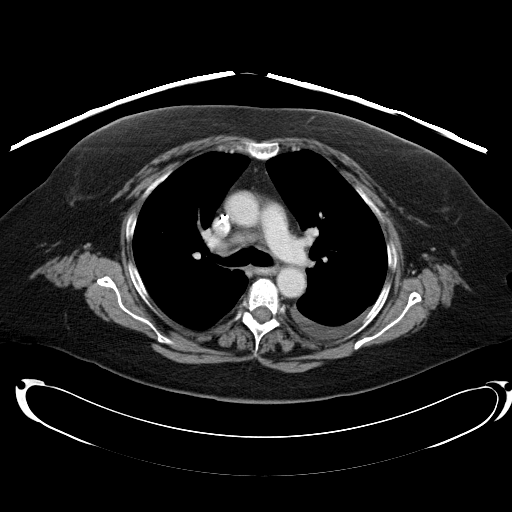
[im 40/57  lung]
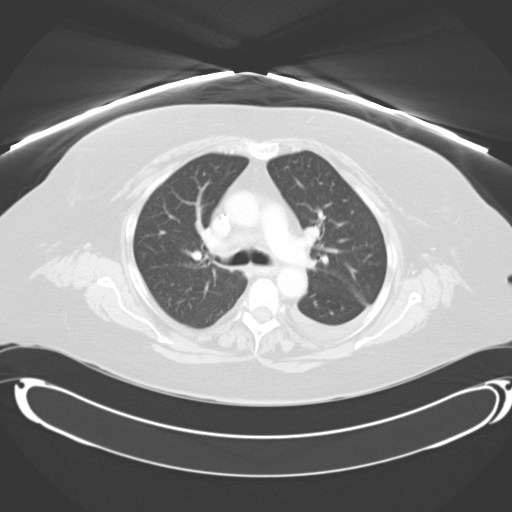
[im 44/57  lung]
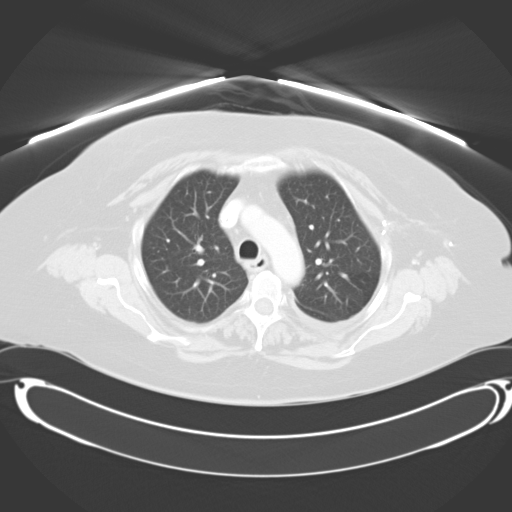
[im 48/57  lung]
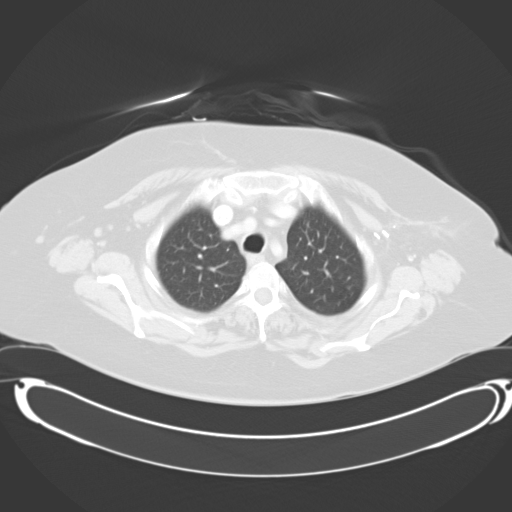
[im 52/57  lung]
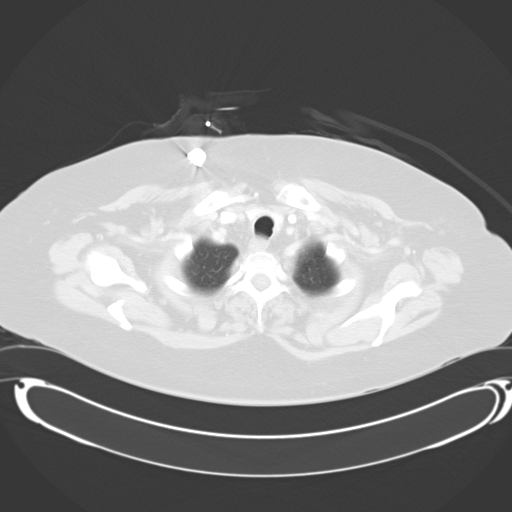

[Series 602: <mpr thick range> · coronal · 0.81mm/px · 3 of 77 slices shown]
[im 16/77  lung]
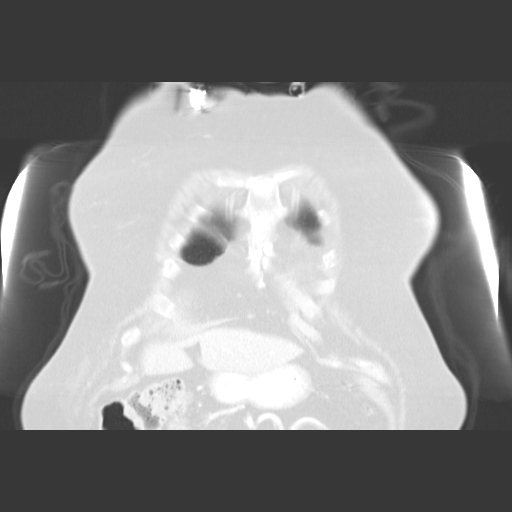
[im 31/77  lung]
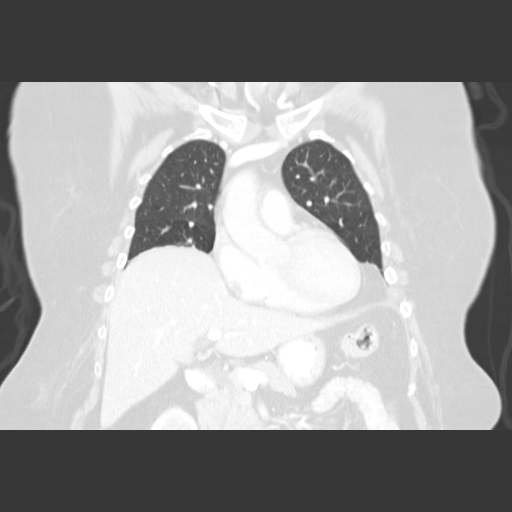
[im 46/77  lung]
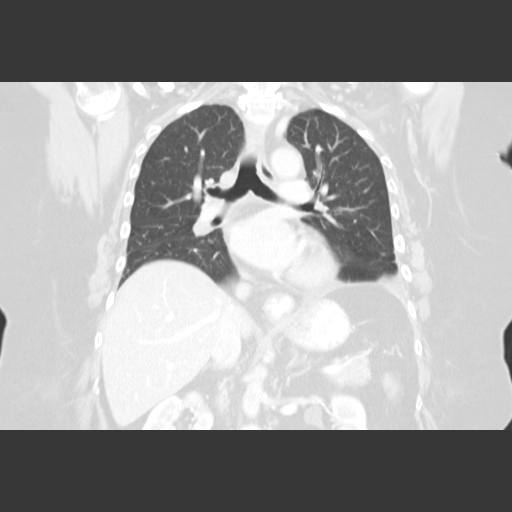

[15 of 36 positions shown; findings below may reference images not displayed]

FINDINGS: Surgical clips are now seen in the left axilla.
Previously seen shotty left axillary and subpectoral
lymphadenopathy is no longer visualized. 12 mm right hilar lymph
node is seen on image 25 which, in retrospect, is unchanged since
prior study.

No other lymphadenopathy are soft tissue masses are identified
within the thorax.  Small left pleural effusion is mildly decreased
in size.  Left lower lobe atelectasis is again noted.  There is no
evidence of pulmonary mass or infiltrate.  Small hiatal hernia is
again noted.
IMPRESSION: 1.  Resolution of left axillary and subpectoral adenopathy since
prior exam.
2.  Stable mild right hilar adenopathy.
3.  Mild decrease in size of small left pleural effusion.
4.  Small hiatal hernia.

## 2010-08-12 LAB — PROTIME-INR
INR: 2.89 — ABNORMAL HIGH (ref 0.00–1.49)
INR: 3.01 — ABNORMAL HIGH (ref 0.00–1.49)
Prothrombin Time: 30 seconds — ABNORMAL HIGH (ref 11.6–15.2)
Prothrombin Time: 43.7 seconds — ABNORMAL HIGH (ref 11.6–15.2)
Prothrombin Time: 17.5 seconds — ABNORMAL HIGH (ref 11.6–15.2)

## 2010-08-12 LAB — COMPREHENSIVE METABOLIC PANEL
ALT: 21 U/L (ref 0–35)
AST: 22 U/L (ref 0–37)
Albumin: 2.8 g/dL — ABNORMAL LOW (ref 3.5–5.2)
Albumin: 2.9 g/dL — ABNORMAL LOW (ref 3.5–5.2)
Alkaline Phosphatase: 71 U/L (ref 39–117)
Alkaline Phosphatase: 95 U/L (ref 39–117)
BUN: 10 mg/dL (ref 6–23)
BUN: 13 mg/dL (ref 6–23)
CO2: 27 mEq/L (ref 19–32)
Chloride: 106 mEq/L (ref 96–112)
Chloride: 108 mEq/L (ref 96–112)
GFR calc Af Amer: >60 mL/min (ref >60)
GFR calc non Af Amer: >60 mL/min (ref >60)
Potassium: 4 mEq/L (ref 3.5–5.1)
Potassium: 4.3 mEq/L (ref 3.5–5.1)
Sodium: 138 mEq/L (ref 135–145)
Total Bilirubin: 0.3 mg/dL (ref 0.3–1.2)
Total Bilirubin: 0.3 mg/dL (ref 0.3–1.2)
Total Protein: 5.7 g/dL — ABNORMAL LOW (ref 6.0–8.3)

## 2010-08-12 LAB — CBC
HCT: 30.4 % — ABNORMAL LOW (ref 36.0–46.0)
HCT: 31.2 % — ABNORMAL LOW (ref 36.0–46.0)
HCT: 29.1 % — ABNORMAL LOW (ref 36.0–46.0)
HCT: 29.9 % — ABNORMAL LOW (ref 36.0–46.0)
Hemoglobin: 10.2 g/dL — ABNORMAL LOW (ref 12.0–15.0)
Hemoglobin: 9.8 g/dL — ABNORMAL LOW (ref 12.0–15.0)
Hemoglobin: 9.5 g/dL — ABNORMAL LOW (ref 12.0–15.0)
Hemoglobin: 9.7 g/dL — ABNORMAL LOW (ref 12.0–15.0)
MCHC: 32.5 g/dL (ref 30.0–36.0)
MCHC: 32.4 g/dL (ref 30.0–36.0)
MCHC: 32.6 g/dL (ref 30.0–36.0)
MCV: 76.5 fL — ABNORMAL LOW (ref 78.0–100.0)
MCV: 77.1 fL — ABNORMAL LOW (ref 78.0–100.0)
MCV: 76.8 fL — ABNORMAL LOW (ref 78.0–100.0)
Platelets: 248 10*3/uL (ref 150–400)
Platelets: 257 10*3/uL (ref 150–400)
RBC: 3.94 MIL/uL (ref 3.87–5.11)
RBC: 4.12 MIL/uL (ref 3.87–5.11)
RBC: 3.79 MIL/uL — ABNORMAL LOW (ref 3.87–5.11)
RBC: 3.89 MIL/uL (ref 3.87–5.11)
RDW: 21.2 % — ABNORMAL HIGH (ref 11.5–15.5)
RDW: 21.6 % — ABNORMAL HIGH (ref 11.5–15.5)
RDW: 22.4 % — ABNORMAL HIGH (ref 11.5–15.5)
WBC: 8.9 10*3/uL (ref 4.0–10.5)
WBC: 9.1 10*3/uL (ref 4.0–10.5)

## 2010-08-12 LAB — BASIC METABOLIC PANEL
CO2: 26 mEq/L (ref 19–32)
CO2: 27 mEq/L (ref 19–32)
CO2: 29 mEq/L (ref 19–32)
Calcium: 8.6 mg/dL (ref 8.4–10.5)
Calcium: 7.9 mg/dL — ABNORMAL LOW (ref 8.4–10.5)
Chloride: 107 mEq/L (ref 96–112)
Chloride: 104 mEq/L (ref 96–112)
Creatinine, Ser: 0.46 mg/dL (ref 0.4–1.2)
GFR calc Af Amer: >60 mL/min (ref >60)
GFR calc Af Amer: >60 mL/min (ref >60)
GFR calc Af Amer: >60 mL/min (ref >60)
GFR calc non Af Amer: >60 mL/min (ref >60)
Glucose, Bld: 138 mg/dL — ABNORMAL HIGH (ref 70–99)
Glucose, Bld: 69 mg/dL — ABNORMAL LOW (ref 70–99)
Glucose, Bld: 88 mg/dL (ref 70–99)
Potassium: 4.4 mEq/L (ref 3.5–5.1)
Sodium: 139 mEq/L (ref 135–145)
Sodium: 137 mEq/L (ref 135–145)
Sodium: 139 mEq/L (ref 135–145)

## 2010-08-12 LAB — TYPE AND SCREEN

## 2010-08-12 LAB — URINALYSIS, MICROSCOPIC ONLY
Bilirubin Urine: NEGATIVE
Glucose, UA: NEGATIVE mg/dL
Hgb urine dipstick: NEGATIVE
Ketones, ur: NEGATIVE mg/dL
Leukocytes, UA: NEGATIVE
Protein, ur: NEGATIVE mg/dL
pH: 6.5 (ref 5.0–8.0)

## 2010-08-12 LAB — URINALYSIS, ROUTINE W REFLEX MICROSCOPIC
Bilirubin Urine: NEGATIVE
Glucose, UA: NEGATIVE mg/dL
Hgb urine dipstick: NEGATIVE
Ketones, ur: NEGATIVE mg/dL
Nitrite: POSITIVE — AB
Protein, ur: NEGATIVE mg/dL
Specific Gravity, Urine: 1.009 (ref 1.005–1.030)
Specific Gravity, Urine: 1.022 (ref 1.005–1.030)
Urobilinogen, UA: 0.2 mg/dL (ref 0.0–1.0)
pH: 5.5 (ref 5.0–8.0)

## 2010-08-12 LAB — DIFFERENTIAL
Basophils Absolute: 0 10*3/uL (ref 0.0–0.1)
Basophils Absolute: 0 10*3/uL (ref 0.0–0.1)
Basophils Absolute: 0 10*3/uL (ref 0.0–0.1)
Basophils Absolute: 0 10*3/uL (ref 0.0–0.1)
Basophils Absolute: 0 10*3/uL (ref 0.0–0.1)
Basophils Relative: 0 % (ref 0–1)
Basophils Relative: 0 % (ref 0–1)
Eosinophils Absolute: 0 10*3/uL (ref 0.0–0.7)
Eosinophils Absolute: 0 10*3/uL (ref 0.0–0.7)
Eosinophils Absolute: 0.3 10*3/uL (ref 0.0–0.7)
Eosinophils Relative: 0 % (ref 0–5)
Lymphocytes Relative: 5 % — ABNORMAL LOW (ref 12–46)
Lymphocytes Relative: 9 % — ABNORMAL LOW (ref 12–46)
Lymphs Abs: 0.5 10*3/uL — ABNORMAL LOW (ref 0.7–4.0)
Lymphs Abs: 0.9 10*3/uL (ref 0.7–4.0)
Monocytes Absolute: 0 10*3/uL — ABNORMAL LOW (ref 0.1–1.0)
Monocytes Absolute: 0.9 10*3/uL (ref 0.1–1.0)
Monocytes Absolute: 0.2 10*3/uL (ref 0.1–1.0)
Monocytes Relative: 1 % — ABNORMAL LOW (ref 3–12)
Monocytes Relative: 3 % (ref 3–12)
Neutro Abs: 6.8 10*3/uL (ref 1.7–7.7)
Neutro Abs: 8.1 10*3/uL — ABNORMAL HIGH (ref 1.7–7.7)

## 2010-08-12 LAB — URINE MICROSCOPIC-ADD ON

## 2010-08-12 LAB — VITAMIN B12: Vitamin B-12: 392 pg/mL (ref 211–911)

## 2010-08-12 LAB — RETICULOCYTES
Retic Count, Absolute: 49.3 10*3/uL (ref 19.0–186.0)
Retic Ct Pct: 1.2 % (ref 0.4–3.1)

## 2010-08-12 LAB — PREPARE RBC (CROSSMATCH)

## 2010-08-12 LAB — FOLATE: Folate: 15.7 ng/mL

## 2010-08-12 LAB — IRON AND TIBC: TIBC: 336 ug/dL (ref 250–470)

## 2010-08-12 LAB — PREPARE PLATELETS

## 2010-08-14 ENCOUNTER — Emergency Department (HOSPITAL_COMMUNITY)
Admission: EM | Admit: 2010-08-14 | Discharge: 2010-08-14 | Disposition: A | Discharge status: Home / Self Care | Payer: 59 | Attending: Emergency Medicine | Admitting: Emergency Medicine

## 2010-08-14 ENCOUNTER — Emergency Department (HOSPITAL_COMMUNITY): Payer: 59

## 2010-08-14 DIAGNOSIS — F329 Major depressive disorder, single episode, unspecified: Secondary | ICD-10-CM | POA: Insufficient documentation

## 2010-08-14 DIAGNOSIS — M810 Age-related osteoporosis without current pathological fracture: Secondary | ICD-10-CM | POA: Insufficient documentation

## 2010-08-14 DIAGNOSIS — M25579 Pain in unspecified ankle and joints of unspecified foot: Secondary | ICD-10-CM | POA: Insufficient documentation

## 2010-08-14 DIAGNOSIS — G2581 Restless legs syndrome: Secondary | ICD-10-CM | POA: Insufficient documentation

## 2010-08-14 DIAGNOSIS — F3289 Other specified depressive episodes: Secondary | ICD-10-CM | POA: Insufficient documentation

## 2010-08-14 DIAGNOSIS — K219 Gastro-esophageal reflux disease without esophagitis: Secondary | ICD-10-CM | POA: Insufficient documentation

## 2010-08-14 DIAGNOSIS — C8190 Hodgkin lymphoma, unspecified, unspecified site: Secondary | ICD-10-CM | POA: Insufficient documentation

## 2010-08-14 DIAGNOSIS — J45909 Unspecified asthma, uncomplicated: Secondary | ICD-10-CM | POA: Insufficient documentation

## 2010-08-15 LAB — COMPREHENSIVE METABOLIC PANEL
ALT: 23 U/L (ref 0–35)
ALT: 33 U/L (ref 0–35)
AST: 32 U/L (ref 0–37)
Albumin: 2.5 g/dL — ABNORMAL LOW (ref 3.5–5.2)
Albumin: 3.3 g/dL — ABNORMAL LOW (ref 3.5–5.2)
Alkaline Phosphatase: 106 U/L (ref 39–117)
BUN: 5 mg/dL — ABNORMAL LOW (ref 6–23)
BUN: 8 mg/dL (ref 6–23)
CO2: 27 mEq/L (ref 19–32)
CO2: 27 mEq/L (ref 19–32)
Calcium: 7.4 mg/dL — ABNORMAL LOW (ref 8.4–10.5)
Calcium: 8.4 mg/dL (ref 8.4–10.5)
Chloride: 111 mEq/L (ref 96–112)
GFR calc Af Amer: >60 mL/min (ref >60)
GFR calc non Af Amer: >60 mL/min (ref >60)
Glucose, Bld: 76 mg/dL (ref 70–99)
Glucose, Bld: 86 mg/dL (ref 70–99)
Glucose, Bld: 86 mg/dL (ref 70–99)
Potassium: 3.8 mEq/L (ref 3.5–5.1)
Potassium: 4 mEq/L (ref 3.5–5.1)
Sodium: 138 mEq/L (ref 135–145)
Sodium: 139 mEq/L (ref 135–145)
Total Bilirubin: 0.5 mg/dL (ref 0.3–1.2)
Total Protein: 5.5 g/dL — ABNORMAL LOW (ref 6.0–8.3)
Total Protein: 6 g/dL (ref 6.0–8.3)

## 2010-08-15 LAB — COMPREHENSIVE METABOLIC PANEL WITH GFR
ALT: 44 U/L — ABNORMAL HIGH (ref 0–35)
ALT: 58 U/L — ABNORMAL HIGH (ref 0–35)
AST: 21 U/L (ref 0–37)
AST: 36 U/L (ref 0–37)
Albumin: 2.6 g/dL — ABNORMAL LOW (ref 3.5–5.2)
Alkaline Phosphatase: 76 U/L (ref 39–117)
BUN: 9 mg/dL (ref 6–23)
CO2: 25 meq/L (ref 19–32)
CO2: 26 meq/L (ref 19–32)
Calcium: 7.6 mg/dL — ABNORMAL LOW (ref 8.4–10.5)
Calcium: 8.3 mg/dL — ABNORMAL LOW (ref 8.4–10.5)
Chloride: 106 meq/L (ref 96–112)
Chloride: 111 meq/L (ref 96–112)
Creatinine, Ser: 0.36 mg/dL — ABNORMAL LOW (ref 0.4–1.2)
Creatinine, Ser: 0.43 mg/dL (ref 0.4–1.2)
GFR calc Af Amer: >60 mL/min (ref >60)
GFR calc Af Amer: >60 mL/min (ref >60)
GFR calc non Af Amer: >60 mL/min (ref >60)
GFR calc non Af Amer: >60 mL/min (ref >60)
Glucose, Bld: 72 mg/dL (ref 70–99)
Glucose, Bld: 93 mg/dL (ref 70–99)
Potassium: 4.1 meq/L (ref 3.5–5.1)
Sodium: 138 meq/L (ref 135–145)
Sodium: 139 meq/L (ref 135–145)
Total Bilirubin: 0.5 mg/dL (ref 0.3–1.2)
Total Bilirubin: 0.6 mg/dL (ref 0.3–1.2)
Total Protein: 5.2 g/dL — ABNORMAL LOW (ref 6.0–8.3)

## 2010-08-15 LAB — CBC
HCT: 31.1 % — ABNORMAL LOW (ref 36.0–46.0)
HCT: 31.2 % — ABNORMAL LOW (ref 36.0–46.0)
HCT: 31.3 % — ABNORMAL LOW (ref 36.0–46.0)
HCT: 32.1 % — ABNORMAL LOW (ref 36.0–46.0)
HCT: 32.5 % — ABNORMAL LOW (ref 36.0–46.0)
Hemoglobin: 10.3 g/dL — ABNORMAL LOW (ref 12.0–15.0)
Hemoglobin: 10.4 g/dL — ABNORMAL LOW (ref 12.0–15.0)
Hemoglobin: 10.6 g/dL — ABNORMAL LOW (ref 12.0–15.0)
Hemoglobin: 10.8 g/dL — ABNORMAL LOW (ref 12.0–15.0)
Hemoglobin: 10.9 g/dL — ABNORMAL LOW (ref 12.0–15.0)
Hemoglobin: 10.9 g/dL — ABNORMAL LOW (ref 12.0–15.0)
MCHC: 33 g/dL (ref 30.0–36.0)
MCHC: 33.3 g/dL (ref 30.0–36.0)
MCHC: 33.7 g/dL (ref 30.0–36.0)
MCHC: 34 g/dL (ref 30.0–36.0)
MCV: 79 fL (ref 78.0–100.0)
MCV: 79.7 fL (ref 78.0–100.0)
MCV: 79.8 fL (ref 78.0–100.0)
MCV: 79.9 fL (ref 78.0–100.0)
MCV: 80.4 fL (ref 78.0–100.0)
Platelets: 296 10*3/uL (ref 150–400)
Platelets: 328 K/uL (ref 150–400)
Platelets: 330 K/uL (ref 150–400)
Platelets: <5 10*3/uL — CL (ref 150–400)
RBC: 3.86 MIL/uL — ABNORMAL LOW (ref 3.87–5.11)
RBC: 3.89 MIL/uL (ref 3.87–5.11)
RBC: 3.91 MIL/uL (ref 3.87–5.11)
RBC: 4.06 MIL/uL (ref 3.87–5.11)
RBC: 4.11 MIL/uL (ref 3.87–5.11)
RDW: 21.6 % — ABNORMAL HIGH (ref 11.5–15.5)
RDW: 22.1 % — ABNORMAL HIGH (ref 11.5–15.5)
RDW: 22.8 % — ABNORMAL HIGH (ref 11.5–15.5)
RDW: 23 % — ABNORMAL HIGH (ref 11.5–15.5)
RDW: 23.1 % — ABNORMAL HIGH (ref 11.5–15.5)
WBC: 0.8 K/uL — CL (ref 4.0–10.5)
WBC: 13.6 K/uL — ABNORMAL HIGH (ref 4.0–10.5)
WBC: 3.4 K/uL — ABNORMAL LOW (ref 4.0–10.5)

## 2010-08-15 LAB — DIFFERENTIAL
Band Neutrophils: 0 % (ref 0–10)
Basophils Absolute: 0 10*3/uL (ref 0.0–0.1)
Basophils Absolute: 0 10*3/uL (ref 0.0–0.1)
Basophils Absolute: 0 K/uL (ref 0.0–0.1)
Basophils Absolute: 0 K/uL (ref 0.0–0.1)
Basophils Absolute: 0 K/uL (ref 0.0–0.1)
Basophils Absolute: 0 K/uL (ref 0.0–0.1)
Basophils Relative: 0 % (ref 0–1)
Basophils Relative: 0 % (ref 0–1)
Basophils Relative: 0 % (ref 0–1)
Basophils Relative: 0 % (ref 0–1)
Eosinophils Absolute: 0 10*3/uL (ref 0.0–0.7)
Eosinophils Absolute: 0 K/uL (ref 0.0–0.7)
Eosinophils Absolute: 0 K/uL (ref 0.0–0.7)
Eosinophils Absolute: 0 K/uL (ref 0.0–0.7)
Eosinophils Absolute: 0 K/uL (ref 0.0–0.7)
Eosinophils Relative: 0 % (ref 0–5)
Eosinophils Relative: 0 % (ref 0–5)
Eosinophils Relative: 0 % (ref 0–5)
Eosinophils Relative: 0 % (ref 0–5)
Eosinophils Relative: 0 % (ref 0–5)
Eosinophils Relative: 0 % (ref 0–5)
Lymphocytes Relative: 0 % — ABNORMAL LOW (ref 12–46)
Lymphocytes Relative: 1 % — ABNORMAL LOW (ref 12–46)
Lymphocytes Relative: 9 % — ABNORMAL LOW (ref 12–46)
Lymphocytes Relative: 9 % — ABNORMAL LOW (ref 12–46)
Lymphs Abs: 0 10*3/uL — ABNORMAL LOW (ref 0.7–4.0)
Lymphs Abs: 0.1 K/uL — ABNORMAL LOW (ref 0.7–4.0)
Lymphs Abs: 0.3 10*3/uL — ABNORMAL LOW (ref 0.7–4.0)
Lymphs Abs: 0.3 K/uL — ABNORMAL LOW (ref 0.7–4.0)
Lymphs Abs: 0.5 K/uL — ABNORMAL LOW (ref 0.7–4.0)
Monocytes Absolute: 0 K/uL — ABNORMAL LOW (ref 0.1–1.0)
Monocytes Absolute: 0.1 10*3/uL (ref 0.1–1.0)
Monocytes Absolute: 0.1 K/uL (ref 0.1–1.0)
Monocytes Absolute: 0.2 K/uL (ref 0.1–1.0)
Monocytes Absolute: 0.4 K/uL (ref 0.1–1.0)
Monocytes Absolute: 0.8 10*3/uL (ref 0.1–1.0)
Monocytes Relative: 0 % — ABNORMAL LOW (ref 3–12)
Monocytes Relative: 1 % — ABNORMAL LOW (ref 3–12)
Monocytes Relative: 3 % (ref 3–12)
Monocytes Relative: 6 % (ref 3–12)
Neutro Abs: 13.1 K/uL — ABNORMAL HIGH (ref 1.7–7.7)
Neutro Abs: 2.9 K/uL (ref 1.7–7.7)
Neutro Abs: 4.6 K/uL (ref 1.7–7.7)
Neutro Abs: 9.3 10*3/uL — ABNORMAL HIGH (ref 1.7–7.7)
Neutrophils Relative %: 0 % — ABNORMAL LOW (ref 43–77)
Neutrophils Relative %: 78 % — ABNORMAL HIGH (ref 43–77)
Neutrophils Relative %: 85 % — ABNORMAL HIGH (ref 43–77)
Neutrophils Relative %: 90 % — ABNORMAL HIGH (ref 43–77)
Neutrophils Relative %: 96 % — ABNORMAL HIGH (ref 43–77)
nRBC: 0 /100{WBCs}

## 2010-08-15 LAB — PREPARE PLATELETS

## 2010-08-15 LAB — URINALYSIS, DIPSTICK ONLY
Bilirubin Urine: NEGATIVE
Bilirubin Urine: NEGATIVE
Glucose, UA: NEGATIVE mg/dL
Glucose, UA: NEGATIVE mg/dL
Glucose, UA: NEGATIVE mg/dL
Hgb urine dipstick: NEGATIVE
Hgb urine dipstick: NEGATIVE
Hgb urine dipstick: NEGATIVE
Hgb urine dipstick: NEGATIVE
Ketones, ur: NEGATIVE mg/dL
Leukocytes, UA: NEGATIVE
Leukocytes, UA: NEGATIVE
Nitrite: NEGATIVE
Nitrite: NEGATIVE
Protein, ur: NEGATIVE mg/dL
Specific Gravity, Urine: 1.005 (ref 1.005–1.030)
Specific Gravity, Urine: 1.007 (ref 1.005–1.030)
Specific Gravity, Urine: 1.007 (ref 1.005–1.030)
Specific Gravity, Urine: 1.01 (ref 1.005–1.030)
Urobilinogen, UA: 0.2 mg/dL (ref 0.0–1.0)
Urobilinogen, UA: 0.2 mg/dL (ref 0.0–1.0)
Urobilinogen, UA: 0.2 mg/dL (ref 0.0–1.0)
Urobilinogen, UA: 1 mg/dL (ref 0.0–1.0)
pH: 7 (ref 5.0–8.0)
pH: 7 (ref 5.0–8.0)

## 2010-08-15 LAB — URINALYSIS, ROUTINE W REFLEX MICROSCOPIC
Bilirubin Urine: NEGATIVE
Bilirubin Urine: NEGATIVE
Glucose, UA: NEGATIVE mg/dL
Glucose, UA: NEGATIVE mg/dL
Hgb urine dipstick: NEGATIVE
Ketones, ur: NEGATIVE mg/dL
Ketones, ur: NEGATIVE mg/dL
Leukocytes, UA: NEGATIVE
Nitrite: NEGATIVE
Protein, ur: NEGATIVE mg/dL
Specific Gravity, Urine: 1.016 (ref 1.005–1.030)
Urobilinogen, UA: 1 mg/dL (ref 0.0–1.0)
pH: 6 (ref 5.0–8.0)
pH: 7 (ref 5.0–8.0)

## 2010-08-15 LAB — PROTIME-INR
INR: 1.43 (ref 0.00–1.49)
INR: 1.61 — ABNORMAL HIGH (ref 0.00–1.49)
INR: 2.04 — ABNORMAL HIGH (ref 0.00–1.49)
INR: 2.62 — ABNORMAL HIGH (ref 0.00–1.49)
INR: 3.31 — ABNORMAL HIGH (ref 0.00–1.49)
Prothrombin Time: 16.5 seconds — ABNORMAL HIGH (ref 11.6–15.2)
Prothrombin Time: 27.8 s — ABNORMAL HIGH (ref 11.6–15.2)
Prothrombin Time: 33.4 s — ABNORMAL HIGH (ref 11.6–15.2)

## 2010-08-15 LAB — BASIC METABOLIC PANEL
BUN: 7 mg/dL (ref 6–23)
Chloride: 109 mEq/L (ref 96–112)
Glucose, Bld: 153 mg/dL — ABNORMAL HIGH (ref 70–99)
Potassium: 4.2 mEq/L (ref 3.5–5.1)
Sodium: 139 mEq/L (ref 135–145)

## 2010-08-15 LAB — CLOSTRIDIUM DIFFICILE EIA: C difficile Toxins A+B, EIA: NEGATIVE

## 2010-08-15 LAB — LACTATE DEHYDROGENASE
LDH: 110 U/L (ref 94–250)
LDH: 119 U/L (ref 94–250)
LDH: 99 U/L (ref 94–250)

## 2010-08-15 LAB — GLUCOSE, CAPILLARY: Glucose-Capillary: 89 mg/dL (ref 70–99)

## 2010-08-15 LAB — URINE MICROSCOPIC-ADD ON

## 2010-08-15 LAB — TYPE AND SCREEN: Antibody Screen: NEGATIVE

## 2010-08-20 LAB — PROTIME-INR: INR: 1.01 (ref 0.00–1.49)

## 2010-08-20 LAB — APTT: aPTT: 30 seconds (ref 24–37)

## 2010-08-20 LAB — CBC
HCT: 31 % — ABNORMAL LOW (ref 36.0–46.0)
Hemoglobin: 10.3 g/dL — ABNORMAL LOW (ref 12.0–15.0)
RBC: 3.66 MIL/uL — ABNORMAL LOW (ref 3.87–5.11)
RDW: 28.6 % — ABNORMAL HIGH (ref 11.5–15.5)

## 2010-08-20 LAB — BASIC METABOLIC PANEL
CO2: 26 mEq/L (ref 19–32)
GFR calc Af Amer: >60 mL/min (ref >60)
GFR calc non Af Amer: >60 mL/min (ref >60)
Glucose, Bld: 83 mg/dL (ref 70–99)
Potassium: 4.1 mEq/L (ref 3.5–5.1)
Sodium: 132 mEq/L — ABNORMAL LOW (ref 135–145)

## 2010-08-28 LAB — URINALYSIS, ROUTINE W REFLEX MICROSCOPIC
Hgb urine dipstick: NEGATIVE
Nitrite: POSITIVE — AB
Protein, ur: NEGATIVE mg/dL
Specific Gravity, Urine: 1.023 (ref 1.005–1.030)
Urobilinogen, UA: 0.2 mg/dL (ref 0.0–1.0)

## 2010-08-28 LAB — COMPREHENSIVE METABOLIC PANEL
ALT: 26 U/L (ref 0–35)
AST: 24 U/L (ref 0–37)
CO2: 27 mEq/L (ref 19–32)
Calcium: 8.7 mg/dL (ref 8.4–10.5)
Creatinine, Ser: 0.49 mg/dL (ref 0.4–1.2)
GFR calc Af Amer: >60 mL/min (ref >60)
GFR calc non Af Amer: >60 mL/min (ref >60)
Sodium: 142 mEq/L (ref 135–145)
Total Protein: 6.6 g/dL (ref 6.0–8.3)

## 2010-08-28 LAB — DIFFERENTIAL
Eosinophils Absolute: 0.3 10*3/uL (ref 0.0–0.7)
Eosinophils Relative: 3 % (ref 0–5)
Lymphocytes Relative: 11 % — ABNORMAL LOW (ref 12–46)
Lymphs Abs: 0.9 10*3/uL (ref 0.7–4.0)
Monocytes Relative: 8 % (ref 3–12)

## 2010-08-28 LAB — CBC
MCHC: 31.8 g/dL (ref 30.0–36.0)
MCV: 77.9 fL — ABNORMAL LOW (ref 78.0–100.0)
Platelets: 250 10*3/uL (ref 150–400)
RBC: 4.59 MIL/uL (ref 3.87–5.11)
RDW: 19.8 % — ABNORMAL HIGH (ref 11.5–15.5)

## 2010-08-28 LAB — PROTIME-INR
INR: 0.98 (ref 0.00–1.49)
Prothrombin Time: 12.9 seconds (ref 11.6–15.2)

## 2010-08-28 LAB — URINE MICROSCOPIC-ADD ON

## 2010-08-30 LAB — GLUCOSE, CAPILLARY: Glucose-Capillary: 80 mg/dL (ref 70–99)

## 2010-09-01 LAB — BASIC METABOLIC PANEL
CO2: 26 mEq/L (ref 19–32)
Calcium: 8.8 mg/dL (ref 8.4–10.5)
Chloride: 107 mEq/L (ref 96–112)
Creatinine, Ser: 0.43 mg/dL (ref 0.4–1.2)
GFR calc Af Amer: >60 mL/min (ref >60)
Sodium: 140 mEq/L (ref 135–145)

## 2010-09-01 LAB — CBC
Hemoglobin: 10.9 g/dL — ABNORMAL LOW (ref 12.0–15.0)
RBC: 3.42 MIL/uL — ABNORMAL LOW (ref 3.87–5.11)
WBC: 27.1 10*3/uL — ABNORMAL HIGH (ref 4.0–10.5)

## 2010-09-01 LAB — PROTIME-INR
INR: 1.2 (ref 0.00–1.49)
Prothrombin Time: 14.8 seconds (ref 11.6–15.2)

## 2010-09-02 LAB — PROTIME-INR
INR: 1.1 (ref 0.00–1.49)
Prothrombin Time: 13.5 seconds (ref 11.6–15.2)

## 2010-09-02 LAB — BASIC METABOLIC PANEL
BUN: 18 mg/dL (ref 6–23)
Calcium: 8 mg/dL — ABNORMAL LOW (ref 8.4–10.5)
GFR calc Af Amer: >60 mL/min (ref >60)
GFR calc Af Amer: >60 mL/min (ref >60)
GFR calc non Af Amer: >60 mL/min (ref >60)
GFR calc non Af Amer: >60 mL/min (ref >60)
Potassium: 4.5 mEq/L (ref 3.5–5.1)
Potassium: 4.1 mEq/L (ref 3.5–5.1)
Sodium: 138 mEq/L (ref 135–145)
Sodium: 137 mEq/L (ref 135–145)

## 2010-09-02 LAB — CBC
HCT: 34 % — ABNORMAL LOW (ref 36.0–46.0)
HCT: 33.7 % — ABNORMAL LOW (ref 36.0–46.0)
Hemoglobin: 11.2 g/dL — ABNORMAL LOW (ref 12.0–15.0)
MCHC: 34 g/dL (ref 30.0–36.0)
MCV: 90.9 fL (ref 78.0–100.0)
Platelets: 109 10*3/uL — ABNORMAL LOW (ref 150–400)
RBC: 3.56 MIL/uL — ABNORMAL LOW (ref 3.87–5.11)
WBC: 23.1 10*3/uL — ABNORMAL HIGH (ref 4.0–10.5)
WBC: 4.5 10*3/uL (ref 4.0–10.5)

## 2010-09-02 LAB — APTT: aPTT: 23 seconds — ABNORMAL LOW (ref 24–37)

## 2010-09-03 LAB — BASIC METABOLIC PANEL
CO2: 24 mEq/L (ref 19–32)
Calcium: 7.3 mg/dL — ABNORMAL LOW (ref 8.4–10.5)
Chloride: 110 mEq/L (ref 96–112)
Creatinine, Ser: 0.39 mg/dL — ABNORMAL LOW (ref 0.4–1.2)
GFR calc Af Amer: >60 mL/min (ref >60)
Glucose, Bld: 113 mg/dL — ABNORMAL HIGH (ref 70–99)

## 2010-09-03 LAB — CBC
Hemoglobin: 9.3 g/dL — ABNORMAL LOW (ref 12.0–15.0)
MCHC: 33.7 g/dL (ref 30.0–36.0)
MCV: 88.4 fL (ref 78.0–100.0)
RBC: 3.11 MIL/uL — ABNORMAL LOW (ref 3.87–5.11)
RDW: 26.5 % — ABNORMAL HIGH (ref 11.5–15.5)

## 2010-09-03 LAB — PROTIME-INR: Prothrombin Time: 13.3 seconds (ref 11.6–15.2)

## 2010-09-06 LAB — PROTIME-INR
INR: 1.1 (ref 0.00–1.49)
Prothrombin Time: 14.3 seconds (ref 11.6–15.2)

## 2010-09-10 LAB — DIFFERENTIAL
Basophils Absolute: 0 10*3/uL (ref 0.0–0.1)
Basophils Absolute: 0 10*3/uL (ref 0.0–0.1)
Basophils Relative: 0 % (ref 0–1)
Basophils Relative: 0 % (ref 0–1)
Basophils Relative: 0 % (ref 0–1)
Eosinophils Absolute: 0.2 10*3/uL (ref 0.0–0.7)
Eosinophils Absolute: 0.2 10*3/uL (ref 0.0–0.7)
Eosinophils Absolute: 0.3 10*3/uL (ref 0.0–0.7)
Eosinophils Absolute: 0.4 10*3/uL (ref 0.0–0.7)
Eosinophils Relative: 1 % (ref 0–5)
Eosinophils Relative: 1 % (ref 0–5)
Lymphocytes Relative: 4 % — ABNORMAL LOW (ref 12–46)
Lymphs Abs: 0.6 10*3/uL — ABNORMAL LOW (ref 0.7–4.0)
Lymphs Abs: 0.8 10*3/uL (ref 0.7–4.0)
Monocytes Absolute: 1.3 10*3/uL — ABNORMAL HIGH (ref 0.1–1.0)
Monocytes Absolute: 1.4 10*3/uL — ABNORMAL HIGH (ref 0.1–1.0)
Monocytes Relative: 7 % (ref 3–12)
Monocytes Relative: 7 % (ref 3–12)
Monocytes Relative: 7 % (ref 3–12)
Neutro Abs: 12.8 10*3/uL — ABNORMAL HIGH (ref 1.7–7.7)
Neutro Abs: 17.7 10*3/uL — ABNORMAL HIGH (ref 1.7–7.7)
Neutrophils Relative %: 82 % — ABNORMAL HIGH (ref 43–77)
Neutrophils Relative %: 87 % — ABNORMAL HIGH (ref 43–77)

## 2010-09-10 LAB — UIFE/LIGHT CHAINS/TP QN, 24-HR UR
Alpha 2, Urine: DETECTED — AB
Beta, Urine: DETECTED — AB
Free Kappa Lt Chains,Ur: 21.2 mg/dL — ABNORMAL HIGH (ref 0.04–1.51)

## 2010-09-10 LAB — URINALYSIS, ROUTINE W REFLEX MICROSCOPIC
Ketones, ur: 15 mg/dL — AB
Nitrite: NEGATIVE
pH: 6 (ref 5.0–8.0)

## 2010-09-10 LAB — CBC
HCT: 30.9 % — ABNORMAL LOW (ref 36.0–46.0)
HCT: 31.7 % — ABNORMAL LOW (ref 36.0–46.0)
HCT: 31.8 % — ABNORMAL LOW (ref 36.0–46.0)
HCT: 32.2 % — ABNORMAL LOW (ref 36.0–46.0)
Hemoglobin: 10.1 g/dL — ABNORMAL LOW (ref 12.0–15.0)
Hemoglobin: 10.3 g/dL — ABNORMAL LOW (ref 12.0–15.0)
Hemoglobin: 10.5 g/dL — ABNORMAL LOW (ref 12.0–15.0)
Hemoglobin: 10.6 g/dL — ABNORMAL LOW (ref 12.0–15.0)
Hemoglobin: 11.4 g/dL — ABNORMAL LOW (ref 12.0–15.0)
MCHC: 31.9 g/dL (ref 30.0–36.0)
MCHC: 31.9 g/dL (ref 30.0–36.0)
MCHC: 32.6 g/dL (ref 30.0–36.0)
MCHC: 33.4 g/dL (ref 30.0–36.0)
MCV: 74.7 fL — ABNORMAL LOW (ref 78.0–100.0)
MCV: 75.5 fL — ABNORMAL LOW (ref 78.0–100.0)
MCV: 76.4 fL — ABNORMAL LOW (ref 78.0–100.0)
Platelets: 378 10*3/uL (ref 150–400)
Platelets: 408 10*3/uL — ABNORMAL HIGH (ref 150–400)
Platelets: 423 10*3/uL — ABNORMAL HIGH (ref 150–400)
Platelets: 447 10*3/uL — ABNORMAL HIGH (ref 150–400)
Platelets: 464 10*3/uL — ABNORMAL HIGH (ref 150–400)
Platelets: 496 10*3/uL — ABNORMAL HIGH (ref 150–400)
RBC: 4.22 MIL/uL (ref 3.87–5.11)
RBC: 4.7 MIL/uL (ref 3.87–5.11)
RDW: 17.5 % — ABNORMAL HIGH (ref 11.5–15.5)
RDW: 17.7 % — ABNORMAL HIGH (ref 11.5–15.5)
RDW: 17.8 % — ABNORMAL HIGH (ref 11.5–15.5)
RDW: 17.9 % — ABNORMAL HIGH (ref 11.5–15.5)
RDW: 17.9 % — ABNORMAL HIGH (ref 11.5–15.5)
RDW: 18 % — ABNORMAL HIGH (ref 11.5–15.5)
RDW: 18.3 % — ABNORMAL HIGH (ref 11.5–15.5)
WBC: 18.8 10*3/uL — ABNORMAL HIGH (ref 4.0–10.5)
WBC: 20.2 10*3/uL — ABNORMAL HIGH (ref 4.0–10.5)

## 2010-09-10 LAB — BASIC METABOLIC PANEL
BUN: 1 mg/dL — ABNORMAL LOW (ref 6–23)
BUN: 2 mg/dL — ABNORMAL LOW (ref 6–23)
BUN: <1 mg/dL — ABNORMAL LOW (ref 6–23)
CO2: 22 mEq/L (ref 19–32)
CO2: 23 mEq/L (ref 19–32)
CO2: 25 mEq/L (ref 19–32)
CO2: 27 mEq/L (ref 19–32)
Calcium: 7.7 mg/dL — ABNORMAL LOW (ref 8.4–10.5)
Chloride: 102 mEq/L (ref 96–112)
Chloride: 104 mEq/L (ref 96–112)
Chloride: 105 mEq/L (ref 96–112)
GFR calc Af Amer: >60 mL/min (ref >60)
GFR calc non Af Amer: >60 mL/min (ref >60)
GFR calc non Af Amer: >60 mL/min (ref >60)
Glucose, Bld: 103 mg/dL — ABNORMAL HIGH (ref 70–99)
Glucose, Bld: 75 mg/dL (ref 70–99)
Glucose, Bld: 77 mg/dL (ref 70–99)
Glucose, Bld: 81 mg/dL (ref 70–99)
Glucose, Bld: 89 mg/dL (ref 70–99)
Potassium: 2.9 mEq/L — ABNORMAL LOW (ref 3.5–5.1)
Potassium: 3.1 mEq/L — ABNORMAL LOW (ref 3.5–5.1)
Potassium: 3.7 mEq/L (ref 3.5–5.1)
Potassium: 4.2 mEq/L (ref 3.5–5.1)
Potassium: 4.2 mEq/L (ref 3.5–5.1)
Sodium: 137 mEq/L (ref 135–145)
Sodium: 139 mEq/L (ref 135–145)

## 2010-09-10 LAB — AFB CULTURE WITH SMEAR (NOT AT ARMC)

## 2010-09-10 LAB — COMPREHENSIVE METABOLIC PANEL
ALT: 35 U/L (ref 0–35)
ALT: 37 U/L — ABNORMAL HIGH (ref 0–35)
AST: 28 U/L (ref 0–37)
Albumin: 2.4 g/dL — ABNORMAL LOW (ref 3.5–5.2)
Alkaline Phosphatase: 215 U/L — ABNORMAL HIGH (ref 39–117)
Calcium: 8.4 mg/dL (ref 8.4–10.5)
Chloride: 102 mEq/L (ref 96–112)
GFR calc Af Amer: >60 mL/min (ref >60)
GFR calc Af Amer: >60 mL/min (ref >60)
Glucose, Bld: 92 mg/dL (ref 70–99)
Potassium: 4.3 mEq/L (ref 3.5–5.1)
Sodium: 137 mEq/L (ref 135–145)
Sodium: 140 mEq/L (ref 135–145)
Total Bilirubin: 0.6 mg/dL (ref 0.3–1.2)
Total Protein: 6.3 g/dL (ref 6.0–8.3)
Total Protein: 7.4 g/dL (ref 6.0–8.3)

## 2010-09-10 LAB — RETICULOCYTES
RBC.: 4.65 MIL/uL (ref 3.87–5.11)
Retic Count, Absolute: 65.1 10*3/uL (ref 19.0–186.0)
Retic Ct Pct: 1.4 % (ref 0.4–3.1)

## 2010-09-10 LAB — FERRITIN: Ferritin: 261 ng/mL (ref 10–291)

## 2010-09-10 LAB — MULTIPLE MYELOMA PANEL, SERUM
Albumin ELP: 40.3 % — ABNORMAL LOW (ref 55.8–66.1)
Alpha-1-Globulin: 9.7 % — ABNORMAL HIGH (ref 2.9–4.9)
Beta 2: 9 % — ABNORMAL HIGH (ref 3.2–6.5)
Beta Globulin: 7.1 % (ref 4.7–7.2)
Gamma Globulin: 19.3 % — ABNORMAL HIGH (ref 11.1–18.8)

## 2010-09-10 LAB — URINE CULTURE: Special Requests: NEGATIVE

## 2010-09-10 LAB — CULTURE, BLOOD (ROUTINE X 2): Culture: NO GROWTH

## 2010-09-10 LAB — CA 125: CA 125: 20.4 U/mL (ref 0.0–30.2)

## 2010-09-10 LAB — APTT: aPTT: 35 seconds (ref 24–37)

## 2010-09-10 LAB — CALCIUM, IONIZED: Calcium, Ion: 1.21 mmol/L (ref 1.12–1.32)

## 2010-09-10 LAB — CANCER ANTIGEN 19-9: CA 19-9: 8.9 U/mL — ABNORMAL LOW (ref <35.0)

## 2010-09-10 LAB — PROTIME-INR: INR: 1.1 (ref 0.00–1.49)

## 2010-09-10 LAB — HEMOCCULT GUIAC POC 1CARD (OFFICE): Fecal Occult Bld: NEGATIVE

## 2010-09-10 LAB — FOLATE: Folate: 9 ng/mL

## 2010-09-11 LAB — COMPREHENSIVE METABOLIC PANEL
ALT: 58 U/L — ABNORMAL HIGH (ref 0–35)
AST: 49 U/L — ABNORMAL HIGH (ref 0–37)
CO2: 25 mEq/L (ref 19–32)
Calcium: 8.9 mg/dL (ref 8.4–10.5)
Chloride: 98 mEq/L (ref 96–112)
GFR calc Af Amer: >60 mL/min (ref >60)
GFR calc non Af Amer: >60 mL/min (ref >60)
Glucose, Bld: 118 mg/dL — ABNORMAL HIGH (ref 70–99)
Sodium: 134 mEq/L — ABNORMAL LOW (ref 135–145)
Total Bilirubin: 1 mg/dL (ref 0.3–1.2)

## 2010-09-11 LAB — DIFFERENTIAL
Blasts: 0 %
Metamyelocytes Relative: 0 %
Myelocytes: 0 %
Neutro Abs: 16.9 10*3/uL — ABNORMAL HIGH (ref 1.7–7.7)
Neutrophils Relative %: 79 % — ABNORMAL HIGH (ref 43–77)
Promyelocytes Absolute: 0 %
nRBC: 0 /100 WBC

## 2010-09-11 LAB — POCT HEMOGLOBIN-HEMACUE: Hemoglobin: 12.6 g/dL (ref 12.0–15.0)

## 2010-09-11 LAB — CBC
HCT: 34.8 % — ABNORMAL LOW (ref 36.0–46.0)
Hemoglobin: 12.6 g/dL (ref 12.0–15.0)
Hemoglobin: 11.4 g/dL — ABNORMAL LOW (ref 12.0–15.0)
MCHC: 32.5 g/dL (ref 30.0–36.0)
MCV: 75.7 fL — ABNORMAL LOW (ref 78.0–100.0)
MCV: 76.4 fL — ABNORMAL LOW (ref 78.0–100.0)
Platelets: 486 10*3/uL — ABNORMAL HIGH (ref 150–400)
RBC: 5.12 MIL/uL — ABNORMAL HIGH (ref 3.87–5.11)
RBC: 4.56 MIL/uL (ref 3.87–5.11)
WBC: 21.4 10*3/uL — ABNORMAL HIGH (ref 4.0–10.5)
WBC: 23.2 10*3/uL — ABNORMAL HIGH (ref 4.0–10.5)

## 2010-09-11 LAB — APTT: aPTT: 42 seconds — ABNORMAL HIGH (ref 24–37)

## 2010-09-17 ENCOUNTER — Other Ambulatory Visit: Payer: Self-pay | Admitting: Oncology

## 2010-09-17 DIAGNOSIS — C8590 Non-Hodgkin lymphoma, unspecified, unspecified site: Secondary | ICD-10-CM

## 2010-09-21 ENCOUNTER — Encounter (HOSPITAL_COMMUNITY)
Admission: RE | Admit: 2010-09-21 | Discharge: 2010-09-21 | Disposition: A | Discharge status: Home / Self Care | Payer: 59 | Source: Ambulatory Visit | Attending: Oncology | Admitting: Oncology

## 2010-09-21 ENCOUNTER — Encounter (HOSPITAL_COMMUNITY): Payer: Self-pay

## 2010-09-21 DIAGNOSIS — J90 Pleural effusion, not elsewhere classified: Secondary | ICD-10-CM | POA: Insufficient documentation

## 2010-09-21 DIAGNOSIS — K429 Umbilical hernia without obstruction or gangrene: Secondary | ICD-10-CM | POA: Insufficient documentation

## 2010-09-21 DIAGNOSIS — K449 Diaphragmatic hernia without obstruction or gangrene: Secondary | ICD-10-CM | POA: Insufficient documentation

## 2010-09-21 DIAGNOSIS — C8590 Non-Hodgkin lymphoma, unspecified, unspecified site: Secondary | ICD-10-CM

## 2010-09-21 DIAGNOSIS — K5730 Diverticulosis of large intestine without perforation or abscess without bleeding: Secondary | ICD-10-CM | POA: Insufficient documentation

## 2010-09-21 DIAGNOSIS — C8190 Hodgkin lymphoma, unspecified, unspecified site: Secondary | ICD-10-CM | POA: Insufficient documentation

## 2010-09-21 DIAGNOSIS — M948X9 Other specified disorders of cartilage, unspecified sites: Secondary | ICD-10-CM | POA: Insufficient documentation

## 2010-09-21 DIAGNOSIS — J320 Chronic maxillary sinusitis: Secondary | ICD-10-CM | POA: Insufficient documentation

## 2010-09-21 HISTORY — DX: Malignant (primary) neoplasm, unspecified: C80.1

## 2010-09-21 LAB — GLUCOSE, CAPILLARY: Glucose-Capillary: 95 mg/dL (ref 70–99)

## 2010-09-21 LAB — POCT I-STAT, CHEM 8
BUN: 20 mg/dL (ref 6–23)
Chloride: 108 mEq/L (ref 96–112)
Potassium: 4.8 mEq/L (ref 3.5–5.1)
Sodium: 138 mEq/L (ref 135–145)
TCO2: 27 mmol/L (ref 0–100)

## 2010-09-21 MED ORDER — IOHEXOL 300 MG/ML  SOLN
100.0000 mL | Freq: Once | INTRAMUSCULAR | Status: AC | PRN
  Administered 2010-09-21: 100 mL via INTRAVENOUS

## 2010-09-21 MED ORDER — FLUDEOXYGLUCOSE F - 18 (FDG) INJECTION
18.6000 | Freq: Once | INTRAVENOUS | Status: AC | PRN
  Administered 2010-09-21: 18.6 via INTRAVENOUS

## 2010-10-09 NOTE — H&P (Signed)
[name], [name]               ACCOUNT NO.:  [id number]   MEDICAL RECORD NO.:  [id number]          PATIENT TYPE:  INP   LOCATION:  5122                         FACILITY:  [facility]   PHYSICIAN:  Michiel Cowboy, MDDATE OF BIRTH:  [birth year]   DATE OF ADMISSION:  06/13/2008  DATE OF DISCHARGE:                              HISTORY & PHYSICAL   PRIMARY CARE Evertte Sones:  Stacie Acres. White, M.D.   CHIEF COMPLAINT:  Nausea, vomiting, weight loss.   Patient is a 65 year old female with known history of ovarian cancer and  a history of depression.  Patient was at her baseline of health up until  a few months ago when she was started on Celebrex for joint pain.  Progressively thereafter, she developed heartburn and eventually  developed a fairly severe epigastric discomfort.  Then she started to  develop nausea and vomiting, but first it was just occasional, usually  associated to food, right after eating.  She was able to tolerate  liquids but had nausea and vomiting with solids.  Today she started to  have worsening nausea and vomiting, could not even keep down liquids, at  which point she presented to her primary care Edwards Mckelvie, who noticed that  she has lost a lot of weight for the past few months and recommended  admission for further evaluation, which patient was directly admitted to  [facility]  She denies any chest pain.  No fevers, no chills.  No  shortness of breath.  Just has nausea and vomiting.  Otherwise, review  of systems unremarkable.  She does have a lot of vaginal itching that  she developed after she had been on antibiotics in the past for an  unclear rash on her ankles.  Since then, this could not be cleared.  She  does endorse some symptoms of depression, but otherwise review of  systems unremarkable.   PAST MEDICAL HISTORY:  1. 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  2. Depression.  3. Asthma.  4. Significant for  ovarian cancer, which was thought to be caught      early.  She is status post oophorectomy and hysterectomy in 1999.   No known drug allergies.   MEDICATIONS:  1. Tylenol Extra Strength p.o. b.i.d.  2. Cetirizine 10 mg daily.  3. Remeron 30 mg p.o. nightly.  4. Wellbutrin 300 mg p.o. q.a.m.  Patient was not able to take these      medications because of nausea.  5. Citalopram 60 mg daily.  6. Singulair 10 mg daily.  7. Diflucan 100 mg p.o. daily.  Most recently started.  Has not taken      a dose yet.  8. Advair 250/50.  9. Albuterol inhaler, as needed.  10.Premarin once daily.   SOCIAL HISTORY:  Patient does not smoke or drink.  Does not use drugs.  Lives at home.   FAMILY HISTORY:  Noncontributory.   PHYSICAL EXAMINATION:  VITALS:  Temperature 97.9, blood pressure 114/80,  pulse 97, respirations 18, saturating 95% on room air.  Patient is  orthostatic.  Patient appears to be in no acute distress, appearing her stated age.  Head is atraumatic.  Somewhat dry mucous membranes.  LUNGS:  Clear to auscultation bilaterally.  Decreased skin turgor.  HEART:  Rapid but regular.  No murmurs appreciated.  ABDOMEN:  Soft, nontender, nondistended.  Diffuse bowel sounds present.  LOWER EXTREMITIES:  No clubbing, cyanosis or edema.  NEUROLOGIC:  Intact.   LABS:  White blood cell count 20.2, hemoglobin 11.6.  Sodium 137,  potassium 4.3, creatinine 0.6, alk phos 215, which is elevated, AST 28,  ALT 37, lipase 18.  Urine culture pending.  UA pending.   EKG showed a normal sinus rhythm with a rate of 85.   KUB within normal limits, showing some stool throughout the colon but no  evidence of obstruction.   Albumin elevated at 2.4.  Calcium was 9.  CBC with differential, which  showed a lot of neutrophils and elevated monocytes.   ASSESSMENT/PLAN:  This is a 65 year old female with nausea, vomiting,  weight loss, and severely elevated white blood cell count.  1. Nausea and vomiting but  no diarrhea and weight loss:  The weight      loss was probably related to nausea and vomiting, but the causes      could not be ruled out.  Nausea and vomiting with no evidence of      obstruction on KUB.  Will treat for now symptomatically with      Zofran.  Will obtain CT of abdomen and pelvis, given weight loss,      and also try to figure out if there is any other etiologies for      elevated white blood cell count and nausea.  Given that she had a      history of severe gastroesophageal reflux disease in the past and      now has progressive worsening of what kind of foods she can      tolerate, also pyloric or esophageal stricture comes to mind,      although somewhat less likely, as sometimes patient is able to hold      food down for some time.  Will also obtain barium swallow.  I      wonder if patient at some point will need to undergo EGD.  Will      Hemoccult stools.  2. Elevated white blood cell count:  Differential is somewhat broad      but will evaluate for evidence of infection with CT of abdomen and      pelvis.  Will obtain chest x-ray but __________ probability      __________ less likely, given she is asymptomatic.  Awaiting UA.      Other differential could be malignancy.  No elevated lymphocytes      but there are monocytes.  Multiple myeloma comes to mind.  Will get      a STAT UPEP and myeloma panel, especially given slightly alk phos.  3. Patient is slightly anemic:  Will obtain an anemia panel.  4. Depression:  Continue home meds.  5. Asthma:  Continue home meds.  6. Prophylaxis:  Protonix.  Will make it twice a day, as patient may      have severe GERD.  Will place SCDs.      Michiel Cowboy, MD  Electronically Signed     AVD/MEDQ  D:  06/13/2008  T:  06/14/2008  Job:  [id number]   cc:   Stacie Acres.  Cliffton Asters, M.D.

## 2010-10-09 NOTE — Op Note (Signed)
NAMECORYNN, Erika [name]               ACCOUNT NO.:  [id number]   MEDICAL RECORD NO.:  [id number]          PATIENT TYPE:  AMB   LOCATION:  [facility]                         FACILITY:  [facility]   PHYSICIAN:  Angelia Mould. Derrell Lolling, M.D.DATE OF BIRTH:  01/11/1946   DATE OF PROCEDURE:  07/11/2008  DATE OF DISCHARGE:                               OPERATIVE REPORT   PREOPERATIVE DIAGNOSIS:  Lymphadenopathy of uncertain etiology.   POSTOPERATIVE DIAGNOSIS:  Lymphadenopathy of uncertain etiology.   OPERATION PERFORMED:  Excision deep left axillary lymph node.   SURGEON:  Dr. Claud Kelp   OPERATIVE INDICATIONS:  This is a 65 year old white female who had a  hysterectomy in 1999 for a very early cancer and there has been no known  recurrence and she has been followed regularly.  Over the past 2 or 3  months, she has been having problems with weight loss, nausea and  vomiting, leukocytosis, depression and fatigue.  Imaging studies have  shown a right hepatic mass, right upper lobe mass, retroperitoneal,  retrocaval, portacaval and mediastinal adenopathy.  She has had image  guided biopsies of the neck and mediastinum which showed some atypical  cells, but these have just been fine needle aspirations and there has  been no confirmatory diagnosis.  She has some narrowing of her esophagus  at the subcarinal level, but  endoscopy showed no cancer.  She is  scheduled for a colonoscopy.  Dr. Laurann Montana called me and asked me  to consider a peripheral lymph node biopsy.  On her imaging studies and  on her physical exam, there is a palpable mass in the left axilla which  has a good chance of being pathologic.  She was counseled as an  outpatient to have this excised and is brought to operating room  electively.   OPERATIVE/TECHNIQUE:  Following the induction of general endotracheal  anesthesia, the patient's left chest wall, left axilla and left shoulder  were prepped and draped in a sterile fashion.   Intravenous antibiotics  were given.  The patient was identified as correct patient, correct  procedure and correct site; 0.5% Marcaine with epinephrine was used as a  local infiltration anesthetic.  A transverse incision was made in the  left axilla at the hairline.  Dissection was carried down through the  subcutaneous tissue.  We incised the clavipectoral fascia.  We  identified the lateral border of the pectoralis major and pectoralis  minor muscles and retracted them medially and entered the axillary  space.  She had a couple of small lymph nodes, but there was a single 2  cm lymph node at level I, deep.  This was mobilized and its attachments  were cauterized.  There was no bleeding.  This lymph node was sent to  the lab and the patient's clinical history was discussed with Dr. Laureen Ochs.   The wound was irrigated with saline.  There was no bleeding or lymphatic  drainage whatsoever.  The deeper tissues were closed with interrupted  sutures of 3-0 Vicryl and the skin closed with subcuticular sutures of 4-  0 Monocryl and Steri-Strips.  Clean bandages were placed and the patient  taken to the recovery room in stable condition.  Estimated blood loss  was about 510 mL.  Complications none. Sponge, needle and instrument  counts were correct.      Angelia Mould. Derrell Lolling, M.D.  Electronically Signed     HMI/MEDQ  D:  07/11/2008  T:  07/11/2008  Job:  [id number]   cc:   Stacie Acres. Cliffton Asters, M.D.  Fax: [phone]

## 2010-10-09 NOTE — Discharge Summary (Signed)
NAMESULEYMA, WAFER               ACCOUNT NO.:  [id number]   MEDICAL RECORD NO.:  [id number]          PATIENT TYPE:  INP   LOCATION:  5122                         FACILITY:  [facility]   PHYSICIAN:  Hollice Espy, M.D.DATE OF BIRTH:  [birth year]   DATE OF ADMISSION:  06/13/2008  DATE OF DISCHARGE:  06/24/2008                               DISCHARGE SUMMARY   ATTENDING PHYSICIAN:  Hollice Espy, M.D.   PRIMARY CARE PHYSICIAN:  Stacie Acres. White, M.D.   CONSULTANTS:  1. Shirley Friar, M.D., [facility].  2. Charlaine Dalton. Sherene Sires, M.D., pulmonary critical care medicine.   DISCHARGE DIAGNOSES:  1. Lymphadenopathy and pulmonary nodules.  2. Dysphagia.  3. Nausea.  4. Depression.  5. Abnormal CT findings of liver lesion.  6. Anemia.  7. Previous history of ovarian cancer.  8. Leukocytosis.  9. Volume overload.  10.History of asthma.   DISCHARGE MEDICATIONS:  1. New medicine Avelox 400 mg p.o. daily times 5 days.  2. Zofran 4 mg p.o. q.6 hours p.r.n. for nausea.  3. The patient will resume all of her previous medicines Tylenol 500      two tablets p.o. b.i.d.  4. Premarin 0.9 p.o. daily.  5. Cetirizine 10 mg p.o. daily.  6. Remeron 30 p.o. q.h.s.  7. Wellbutrin SR 300 daily.  8. Citalopram 60 mg p.o. daily.  9. Singulair 10 p.o. q.h.s.  10.Diflucan 100 p.o. daily.  11.Phenergan 12.5 p.o. q.6 h p.r.n.  12.Advair 200/50 one puff b.i.d.  13.Maxair Autohaler 1-2 puffs weekly.  14.The patient was also given a prescription for nystatin p.r.n.   DISCHARGE DIET:  The patient is being discharged on a soft chewable food  diet as tolerated.   ACTIVITY:  Will be increase activity slowly.   DISPOSITION:  From initial presentation is mild improvement.   HOSPITAL COURSE:  The patient is a 65 year old white female with past  medical history of ovarian cancer, depression and asthma who presented  with 4 weeks of nausea, vomiting and weight loss.  She also had some  intermittent  swallowing where she felt that fluid, liquids and pills  were hang up and she felt like she had a heaviness in her chest.  She  also has at times problems with nausea and vomiting.  She initially said  that her symptoms began well before 4 weeks, closer to Thanksgiving, but  much more prominent for the past 4 weeks.  When she presented to the  emergency room she was admitted for further evaluation.  She underwent a  CAT scan and it showed mediastinal retroperitoneal adenopathy concerning  for metastatic disease as well as a questionable lesion in the right  lower lobe of the liver.  Dr. Billy Fischer from pulmonary medicine was  consulted.  The patient underwent a bronchoscopy on June 22, 2008.  The results of this were quite inconclusive showing some atypical cells,  specifically cells with a lymphoid predominance but with slightly  enlarged nuclei or multiple nuclei but no signs of any gross signs of  any lymphoproliferative disease.  They recommended repeat biopsy.  At  this point Dr. Billy Fischer referred the patient, recommended the  patient be evaluated by gastroenterology.  [facility] was consulted.  The  patient underwent an esophagram which showed some narrowing of the  thoracic esophagus at the subcarinal level as well as prominent  cricopharyngeus muscle and small Zenker diverticulum.  The patient  underwent then an endoscopic ultrasound which showed mild gastritis,  small hiatal hernia, mildly tortuous esophagus but no evidence of any  stricture malignancy was noted.  That procedure was done on January 24.  The patient then underwent a fine needle aspiration on January 27 by  upper endoscopic ultrasound fine needle aspiration.  These cells all  noted, showed no evidence of any obvious Reed-Sternberg cells but with  the atypical lymphocyte predominance a lymphoblastic process could not  be excluded.  At this point the patient's p.o. intake was improved.  She  was tolerating  milkshakes, she was tolerating some soft mechanical foods  such as scrambled eggs and she was otherwise felt to be medically stable  and doing well and eager to go home.  In the meantime while she has been  evaluated she was noted to have by x-rays and CT some pulmonary vascular  congestion, opacity of left lung base, possible pneumonia.  The patient  was started on antibiotics starting at time of admission, Rocephin and  Zithromax.  Over the past several days her white count peaked as high as  19.7 then dropped down to 15.6 on January 28.  It did, however, then  increase back up to 18.8 on January 29.  I discussed this with her PCP  and the plan will be to continue her on will try fluoroquinolone  antibiotic for 5 more days in case that this could be some residual  pneumonia, perhaps obstructive.  Given that the patient is otherwise  being afebrile I do not feel that she is unstable.  Her blood pressure,  heart rate have been otherwise okay and the plan will be for discharge  of the patient to home.  I discussed with her PCP, Dr. Laurann Montana,  the next step in her course and treatment and agreed that what we need  more than anything else is a tissue sample and so for the past two  biopsy attempts have been inconclusive.  The plan will be then that the  patient needs a PET scan.  This can tell us in terms of lymphoblastic  activity what is required, what is if there is indeed a concern for  hypermetabolic activity considering cancer or if this is perhaps  something else.  In addition to it can also look at the lesion in the  liver and see if that is indeed also an area which would lend more  credence to a possibility of metastatic cancer if indeed her liver lobe  also shows a positive on her PET scan.  In the interim also because we  absolutely still need a tissue sample the plan will be for the patient  following PET scan to get a liver biopsy.  A message has been left  currently with  an interventional radiology scheduler and we are waiting  a call back for set up  of date and time.  In the meantime the patient's PET scan has been set  up for February 3 at 10:45 a.m. with the [facility] radiology  department.  In the interim the patient will also follow up with Dr.  Laurann Montana next week for  repeat CBC.  The patient is being discharged  to home.      Hollice Espy, M.D.  Electronically Signed     SKK/MEDQ  D:  06/24/2008  T:  06/24/2008  Job:  [id number]   cc:   Stacie Acres. Cliffton Asters, M.D.  Oley Balm Sung Amabile, MD  Shirley Friar, MD

## 2010-10-09 NOTE — Consult Note (Signed)
Erika [name], Erika [name]               ACCOUNT NO.:  [id number]   MEDICAL RECORD NO.:  [id number]          PATIENT TYPE:  INP   LOCATION:  5122                         FACILITY:  [facility]   PHYSICIAN:  Shirley Friar, MDDATE OF BIRTH:  [birth year]   DATE OF CONSULTATION:  DATE OF DISCHARGE:                                 CONSULTATION   REQUESTING PHYSICIAN:  Kela Millin, MD   INDICATION:  Nausea, vomiting, dysphagia.   HISTORY OF PRESENT ILLNESS:  This is a 65 year old white female with  history of ovarian cancer who was admitted for nausea, vomiting, and  weight loss.  She reports that for the last 4 weeks she has had  intermittent trouble swallowing where food, liquids, and pills hang up  and feels like a cannonball in my chest.  The food, pills, or liquid  eventually go down and sometimes she has to regurgitate them up.  She  has been having problems with nausea and vomiting as well as over the  past 4 weeks.  She says she initially noticed problems at Thanksgiving  in 2009.  She underwent a CAT scan, which showed mediastinal  retroperitoneal adenopathy concerning for metastatic disease as well as  concerning for metastatic disease in the right lobe of liver.  She  underwent a bronchoscopy by Dr. Sharol Harness on November 20, 2008, and findings  and results are now pending.  An esophagram was done on June 14, 2008, which showed narrowing of the thoracic esophagus at the subcarinal  level as well as prominent cricopharyngeus muscle, impression, with a  small Zenker diverticulum.  There was narrowing at the GE junction as  well.  Pulmonary doctors recommended a GI consult to evaluate her nausea  and vomiting and to consider EGD with lymph node biopsy.   Past medical history is significant for:  1. Ovarian cancer.  2. Depression.  3. Asthma.   MEDICINES:  See chart.   PHYSICAL EXAMINATION:  VITAL SIGNS:  Temperature 98.5, pulse 91, blood  pressure 112/73, O2 sats 92% on room  air.  GENERAL:  Alert, no acute stress.  ABDOMEN:  Soft, nontender, nondistended.  Positive bowel sounds.   IMPRESSION:  This is a 66 year old white female with nausea, vomiting,  dysphagia, abnormal esophagram, and lymphadenopathy in her mediastinum  retroperitoneal concerning for metastasis.  The patient would likely  need to have an endoscopic ultrasound for lymph node biopsy if her  bronchoscopy biopsies are nondiagnostic.  Prior to doing an endoscopic  ultrasound, she will need a standard endoscope to assess her esophagus  and look for any esophageal stricture or malignancy.  We will plan to do  an upper endoscopy on June 19, 2008, and depending on  results of that, she may need to have an endoscopic ultrasound in the  near future.  She is currently tolerating a liquid diet without vomiting  since the Reglan was started.  I discussed risks and benefits of upper  endoscopy with her and she agrees to proceed.      Shirley Friar, MD  Electronically Signed     VCS/MEDQ  D:  06/18/2008  T:  06/19/2008  Job:  [id number]

## 2010-10-09 NOTE — Op Note (Signed)
Erika [name], Erika [name]               ACCOUNT NO.:  [id number]   MEDICAL RECORD NO.:  [id number]          PATIENT TYPE:  INP   LOCATION:  5122                         FACILITY:  [facility]   PHYSICIAN:  Shirley Friar, MDDATE OF BIRTH:  1945-12-16   DATE OF PROCEDURE:  DATE OF DISCHARGE:                               OPERATIVE REPORT   INDICATION:  Nausea, vomiting, and dysphagia.   MEDICATIONS:  Fentanyl 40 mcg IV, Versed 4 mg IV, Cetacaine spray.   FINDINGS:  Endoscope was inserted through the oropharynx and esophagus  was intubated.  There was no stricture seen in the esophageal lumen and  the endoscope was passed freely down into the stomach without  resistance.  In the stomach, there was linear erythema from the mid-body  down to the prepyloric channel consistent with mild gastritis.  Retroflexion was done, which revealed a small hiatal hernia.  No other  mucosal abnormalities were seen in the stomach.  Endoscope was  straightened and advanced into the duodenal bulb and second portion of  duodenum, which were both normal.  Endoscope was withdrawn back into the  stomach and then back into the esophagus.  There were scattered areas of  desquamation of the esophageal lining that was minimal.  No other  mucosal abnormalities were seen.   ASSESSMENT:  1. Mild gastritis.  2. Small hiatal hernia.  3. Mildly tortuous esophagus.  4. No esophageal stricture or esophageal malignancy seen.   PLAN:  1. Consider endoscopic ultrasound for lymph node biopsy if      bronchoscopy specimen is nondiagnostic.  2. Resume previous diet when awake.      Shirley Friar, MD  Electronically Signed     VCS/MEDQ  D:  06/19/2008  T:  06/19/2008  Job:  [id number]

## 2010-10-09 NOTE — Consult Note (Signed)
Erika [name], Erika [name]               ACCOUNT NO.:  [id number]   MEDICAL RECORD NO.:  [id number]          PATIENT TYPE:  OUT   LOCATION:  XRAY                         FACILITY:  [facility]   PHYSICIAN:  Sanjeev K. Deveshwar, M.D.DATE OF BIRTH:  April 04, 1946   DATE OF CONSULTATION:  11/22/2008  DATE OF DISCHARGE:  11/22/2008                                 CONSULTATION   CHIEF COMPLAINT:  Status post kyphoplasty at L3 on November 11, 2008.   BRIEF HISTORY:  This is a very pleasant unfortunate 65 year old female  who was referred to Dr. Corliss Skains through the courtesy of Dr. Darnelle Catalan  for evaluation of an L3 compression fracture.  The patient has a several-  month history of back pain.  She had an MRI performed on November 02, 2008  that showed an acute fracture at L3 with an old fracture at L4.  She had  congenital fusion of T11 and T12.  The patient was seen in consultation  by Dr. Corliss Skains on November 10, 2008 at that time, the kyphoplasty  procedure was described in detail along with the risks and benefits.  The patient and her husband decided to proceed and on November 11, 2008, the  patient had a kyphoplasty at the L3 level performed by Dr. Corliss Skains.  She returns today accompanied by her husband to be seen in followup.   Past medical history is significant for recently diagnosed Hodgkin  lymphoma.  She has a previous history of ovarian cancer.  She has a  history of depression.  She has a history of asthma.   SURGICAL HISTORY:  Significant for an appendectomy, hysterectomy, and  cholecystectomy.   ALLERGIES:  No known drug allergies.   MEDICATIONS:  The patient is on chemotherapy.  She takes dexamethasone,  Ativan, potassium supplement, Zyrtec, Advair, Astelin, Septra DS,  Valtrex, Wellbutrin.  She has been taking Aleve for pain.  She  apparently does not have any stronger pain medication at this time.   SOCIAL HISTORY:  The patient is married.  She lives with her husband in  [place].  She has  five children; three boys and two girls who are  grown.  She quit smoking in 1985.  She drinks socially.  She is a  retired Print production planner.   FAMILY HISTORY:  Her mother died at age 66 from complications of  Alzheimer dementia.  Her father died at age 5 from congestive heart  failure.  She has one brother age 46 alive and well and one sister age  18 alive and well.   IMPRESSION AND PLAN:  The patient returns today after undergoing an L3  kyphoplasty on November 11, 2008.  Her MRI performed on November 02, 2008 showed  an acute fracture at L3 with an old fracture at L4.  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  A biopsy from the kyphoplasty at L3 was negative  for tumor or malignancy.   The patient and her husband report that the patient felt great for 5  days after the kyphoplasty; however, the pain suddenly came back and was  just severe  as before.  It appears to be in the same general vicinity.  She rates the pain as at least a 6 on a 1-10 scale.  She has been unable  to walk since the pain returned prior to this new onset she was able to  walk long distances.  She denies any specific injury.   Dr. Corliss Skains reviewed the previous MRI as well as the images from the  kyphoplasty.  He shared these images with the patient and her husband.  He believes that the patient has suffered a new fracture probably at L4,  possibly at L2.  The patient and her husband are anxious to have this  addressed as soon as possible.  We were able to make arrangements for  her to have an MRI performed today to rule out a new fracture.  If this  is the case, they would like to proceed with a kyphoplasty as soon as  possible.  We will try to schedule this for Thursday or Friday of this  week.   The patient states she was not getting any pain relief with her Aleve.  We did give her prescription for Vicodin 5/325 to be taken one to two  every 6 hours p.r.n., # 30, no refill.  The further recommendations will  be  based on the results of the MRI.  The patient was cautioned not to  drive or operate any machinery while taking the Vicodin.  Greater than  15 minutes was spent on this consult.      Delton See, P.A.    ______________________________  Grandville Silos. Corliss Skains, M.D.    DR/MEDQ  D:  11/22/2008  T:  11/23/2008  Job:  [id number]   cc:   Valentino Hue. Magrinat, M.D.  [place] Cliffton Asters, M.D.

## 2010-10-09 NOTE — Op Note (Signed)
Erika [name], Erika [name]               ACCOUNT NO.:  [id number]   MEDICAL RECORD NO.:  [id number]          PATIENT TYPE:  INP   LOCATION:  5122                         FACILITY:  [facility]   PHYSICIAN:  Oley Balm. Sung Amabile, MD   DATE OF BIRTH:  04-08-1946   DATE OF PROCEDURE:  06/17/2008  DATE OF DISCHARGE:                               OPERATIVE REPORT   INDICATION:  Left upper lobe nodule and subcarinal lymphadenopathy with  a history of ovarian cancer.   SEDATION:  Fentanyl 75 mcg IV, Versed 6 mg IV.   ANESTHESIA:  Topical anesthesia was applied to the nose and throat and  60 mL of 1% lidocaine were used during the course of the procedure.   PROCEDURE:  After adequate sedation and anesthesia, the bronchoscope was  introduced via the right naris and advanced in the posterior pharynx.  This demonstrated normal upper airway anatomy.  Further anesthesia was  achieved with 1% lidocaine and the scope was advanced to the trachea.  Complete airway anesthesia was achieved with 1% lidocaine and an airway  examination was performed.  Bronchial anatomical arrangement was normal.  Bronchial mucosa revealed some nodularity in the trachea, otherwise was  normal.  There were no endobronchial tumors, masses, or foreign bodies.  There were minimal secretions and there was no bleeding.  After  completion of the airway examination, transbronchial needle aspiration  was performed at the level of the main carina.  The needle was passed 4  times.  These specimens were sent for cytology.  Subsequent to this,  washings were obtained from the left upper lobe and sent for cytology  and AFB smears.  Finally, the cytology brush was passed in the left  upper lobe with attempts to direct it in the area of the previously seen  left upper lobe nodule (seen on CT scan of the chest).  The cytology  brush was passed 3 times and these specimens were sent for cytology and  AFB studies.  There was an attempt to pass the  transbronchial biopsy  forceps into the left upper lobe, but because of the tight band, the  forceps could not be directed into the desired region.  Therefore, the  procedure was discontinued at that point.  The patient tolerated the  procedure well without any complications.   SPECIMENS SENT:  As described above.      Oley Balm Sung Amabile, MD  Electronically Signed     DBS/MEDQ  D:  06/23/2008  T:  06/23/2008  Job:  [id number]   cc:   Stacie Acres. Cliffton Asters, M.D.

## 2010-12-10 ENCOUNTER — Other Ambulatory Visit: Payer: Self-pay | Admitting: Oncology

## 2010-12-10 ENCOUNTER — Encounter (HOSPITAL_BASED_OUTPATIENT_CLINIC_OR_DEPARTMENT_OTHER): Payer: 59 | Admitting: Oncology

## 2010-12-10 DIAGNOSIS — C8118 Nodular sclerosis classical Hodgkin lymphoma, lymph nodes of multiple sites: Secondary | ICD-10-CM

## 2010-12-10 LAB — CBC WITH DIFFERENTIAL/PLATELET
Basophils Absolute: 0 10*3/uL (ref 0.0–0.1)
EOS%: 3.7 % (ref 0.0–7.0)
Eosinophils Absolute: 0.3 10*3/uL (ref 0.0–0.5)
HCT: 38.2 % (ref 34.8–46.6)
HGB: 12.8 g/dL (ref 11.6–15.9)
MCH: 29 pg (ref 25.1–34.0)
MCV: 86.8 fL (ref 79.5–101.0)
NEUT#: 5.4 10*3/uL (ref 1.5–6.5)
NEUT%: 72.6 % (ref 38.4–76.8)
RDW: 16.5 % — ABNORMAL HIGH (ref 11.2–14.5)
lymph#: 1.2 10*3/uL (ref 0.9–3.3)

## 2010-12-10 LAB — COMPREHENSIVE METABOLIC PANEL
AST: 21 U/L (ref 0–37)
Albumin: 3.9 g/dL (ref 3.5–5.2)
BUN: 11 mg/dL (ref 6–23)
Calcium: 9.3 mg/dL (ref 8.4–10.5)
Chloride: 104 mEq/L (ref 96–112)
Creatinine, Ser: 0.68 mg/dL (ref 0.50–1.10)
Glucose, Bld: 105 mg/dL — ABNORMAL HIGH (ref 70–99)
Potassium: 4.1 mEq/L (ref 3.5–5.3)

## 2010-12-10 LAB — LACTATE DEHYDROGENASE: LDH: 86 U/L — ABNORMAL LOW (ref 94–250)

## 2010-12-10 LAB — URIC ACID: Uric Acid, Serum: 5.8 mg/dL (ref 2.4–7.0)

## 2010-12-17 ENCOUNTER — Encounter (HOSPITAL_BASED_OUTPATIENT_CLINIC_OR_DEPARTMENT_OTHER): Payer: 59 | Admitting: Oncology

## 2010-12-17 DIAGNOSIS — C8118 Nodular sclerosis classical Hodgkin lymphoma, lymph nodes of multiple sites: Secondary | ICD-10-CM

## 2011-06-03 ENCOUNTER — Other Ambulatory Visit (HOSPITAL_COMMUNITY): Payer: Self-pay

## 2011-06-04 ENCOUNTER — Other Ambulatory Visit (HOSPITAL_BASED_OUTPATIENT_CLINIC_OR_DEPARTMENT_OTHER): Payer: 59 | Admitting: Lab

## 2011-06-04 ENCOUNTER — Other Ambulatory Visit: Payer: Self-pay | Admitting: Oncology

## 2011-06-04 ENCOUNTER — Encounter (HOSPITAL_COMMUNITY)
Admission: RE | Admit: 2011-06-04 | Discharge: 2011-06-04 | Disposition: A | Discharge status: Home / Self Care | Payer: Medicare Other | Source: Ambulatory Visit | Attending: Oncology | Admitting: Oncology

## 2011-06-04 DIAGNOSIS — C8118 Nodular sclerosis classical Hodgkin lymphoma, lymph nodes of multiple sites: Secondary | ICD-10-CM

## 2011-06-04 DIAGNOSIS — C8190 Hodgkin lymphoma, unspecified, unspecified site: Secondary | ICD-10-CM | POA: Insufficient documentation

## 2011-06-04 DIAGNOSIS — C8590 Non-Hodgkin lymphoma, unspecified, unspecified site: Secondary | ICD-10-CM

## 2011-06-04 DIAGNOSIS — Z8543 Personal history of malignant neoplasm of ovary: Secondary | ICD-10-CM | POA: Insufficient documentation

## 2011-06-04 LAB — CBC WITH DIFFERENTIAL/PLATELET
Basophils Absolute: 0 10*3/uL (ref 0.0–0.1)
EOS%: 3.1 % (ref 0.0–7.0)
HGB: 12.3 g/dL (ref 11.6–15.9)
MCH: 28.1 pg (ref 25.1–34.0)
NEUT#: 6.7 10*3/uL — ABNORMAL HIGH (ref 1.5–6.5)
RDW: 16.1 % — ABNORMAL HIGH (ref 11.2–14.5)
lymph#: 1.4 10*3/uL (ref 0.9–3.3)

## 2011-06-04 LAB — COMPREHENSIVE METABOLIC PANEL
ALT: 19 U/L (ref 0–35)
AST: 19 U/L (ref 0–37)
Albumin: 3.7 g/dL (ref 3.5–5.2)
Alkaline Phosphatase: 126 U/L — ABNORMAL HIGH (ref 39–117)
Glucose, Bld: 82 mg/dL (ref 70–99)
Potassium: 4.3 mEq/L (ref 3.5–5.3)
Sodium: 137 mEq/L (ref 135–145)
Total Protein: 6.2 g/dL (ref 6.0–8.3)

## 2011-06-04 LAB — LACTATE DEHYDROGENASE: LDH: 90 U/L — ABNORMAL LOW (ref 94–250)

## 2011-06-04 LAB — GLUCOSE, CAPILLARY: Glucose-Capillary: 81 mg/dL (ref 70–99)

## 2011-06-04 MED ORDER — FLUDEOXYGLUCOSE F - 18 (FDG) INJECTION
16.3000 | Freq: Once | INTRAVENOUS | Status: AC | PRN
  Administered 2011-06-04: 16.3 via INTRAVENOUS

## 2011-06-10 ENCOUNTER — Encounter: Payer: Self-pay | Admitting: *Deleted

## 2011-06-11 ENCOUNTER — Ambulatory Visit (HOSPITAL_BASED_OUTPATIENT_CLINIC_OR_DEPARTMENT_OTHER): Payer: 59 | Admitting: Oncology

## 2011-06-11 ENCOUNTER — Telehealth: Payer: Self-pay | Admitting: Oncology

## 2011-06-11 VITALS — BP 113/78 | HR 82 | Temp 98.0°F | Ht 61.0 in | Wt 193.0 lb

## 2011-06-11 DIAGNOSIS — C8110 Nodular sclerosis classical Hodgkin lymphoma, unspecified site: Secondary | ICD-10-CM | POA: Insufficient documentation

## 2011-06-11 DIAGNOSIS — Z8571 Personal history of Hodgkin lymphoma: Secondary | ICD-10-CM

## 2011-06-11 DIAGNOSIS — M810 Age-related osteoporosis without current pathological fracture: Secondary | ICD-10-CM

## 2011-06-11 DIAGNOSIS — J45909 Unspecified asthma, uncomplicated: Secondary | ICD-10-CM

## 2011-06-11 NOTE — Telephone Encounter (Signed)
06/11/2011 Erika [name]   Pet Scan denied by [facility] Medicare because it does not follow protocol for Hodgkin's Lymphoma.  The case can now go to Appeals if Dr.Magrinat wants to continue.  [phone] is the fax number to start the appeals process.

## 2011-06-11 NOTE — Progress Notes (Signed)
ID: LIYANA SUNIGA  DOB: Apr 14, 1946  MR#: [id number]  CSN#: [id number]   Interval History:  Markeshia returns today for followup of her Hodgkin's lymphoma. The interval history has been generally unremarkable. She enjoyed the holidays, which she spends partly in [place]. There have been no significant family changes  ROS:  She is concerned that she continues to gain weight. We did have a long discussion regarding diet issues, but until she starts an exercise program making headway he year is going to be difficult. She is going off the Fosamax and will be starting week lasted for osteoporosis under Dr. Cliffton Asters. Otherwise she has a mild cough, with a history of asthma. She had a recent ear infection which is now fine. She has severe urinary incontinence. "The next step is surgery", and she is reluctant to do that. But she is doing now is planning herself to use the bathroom every 2 hours and that does help some. She feels a little bit on the depressed side. Otherwise a detailed review of systems today was noncontributory and in particular there has been no fever, rash, bleeding, or unexplained fatigue.  Allergies  Allergen Reactions  . Cephalexin     Current Outpatient Prescriptions  Medication Sig Dispense Refill  . ARIPiprazole (ABILIFY) 10 MG tablet Take 5 mg by mouth daily.       Marland Kitchen azelastine (ASTELIN) 137 MCG/SPRAY nasal spray Place 1 spray into the nose 2 (two) times daily. Use in each nostril as directed      . cetirizine (ZYRTEC) 10 MG tablet Take 10 mg by mouth 2 (two) times daily.       . clonazePAM (KLONOPIN) 0.5 MG tablet Take 0.5 mg by mouth.      . escitalopram (LEXAPRO) 20 MG tablet Take 20 mg by mouth daily.      Marland Kitchen esomeprazole (NEXIUM) 40 MG capsule Take 40 mg by mouth daily before breakfast.      . fluticasone-salmeterol (ADVAIR HFA) 115-21 MCG/ACT inhaler Inhale 2 puffs into the lungs 2 (two) times daily.      . montelukast (SINGULAIR) 10 MG tablet Take 10 mg by mouth at bedtime.       . pirbuterol (MAXAIR) 200 MCG/INH inhaler Inhale 2 puffs into the lungs 4 (four) times daily.      Marland Kitchen zolpidem (AMBIEN) 10 MG tablet Take 10 mg by mouth at bedtime as needed.        PAST MEDICAL HISTORY:  The past medical history is significant for :  Depression.  She has a history of migraines, although these are rare.  She has a history of asthma, which is treated by Dr. Cliffton Asters, a history of tobacco abuse, the patient quitting in 1992, smoking about one-half pack per day for perhaps 30 years.  There is a history of ovarian cancer, and she underwent TAH/BSO in 1999.  I do not have those records.  She also had at least partial omentectomy by her account.  This was done under Dr. Arnell [name] and Dr. De Blanch.  The patient was told that she did not need any adjuvant treatment, and indeed she has been disease free now for more than ten years.  She has a small hiatal hernia.  She has significant osteoarthritis involving the knees, which makes it difficult for her to ambulate.  She has a history of hypercholesterolemia, and she has a history of stasis dermatitis involving the left ankle.    FAMILY HISTORY:  The patient's father died at  the age of 21 from congestive heart failure.  The patient's mother died at the age of 3 with Alzheimer's disease.  The patient has one brother and one sister, but there is no other cancer in the family.   GYNECOLOGIC HISTORY:  She is GX P1, first pregnancy to term at age 66.  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  Her husband, Annette Stable, was a Production designer, theatre/television/film for [employer].  The patient has a son from an earlier marriage, Tinnie Gens, who lives in [place], [place], and works in IT for a hospital there.  Annette Stable has four children of his own all in this area.  There are a total of 14 grandchildren.  The patient attends [employer], and is very active in  that community.   Objective:  Filed Vitals:   06/11/11 1015  BP: 113/78  Pulse: 82  Temp: 98 F (36.7 C)    BMI: Body mass index is 36.47 kg/(m^2).   ECOG FS: 1  Physical Exam:   Sclerae unicteric  Oropharynx clear  No peripheral adenopathy  Lungs clear -- no rales or rhonchi  Heart regular rate and rhythm  Abdomen benign  MSK no focal spinal tenderness, no peripheral edema  Neuro nonfocal  Breast exam: deferred  Lab Results:      Chemistry      Component Value Date/Time   NA 137 06/04/2011 0936   K 4.3 06/04/2011 0936   CL 105 06/04/2011 0936   CO2 24 06/04/2011 0936   BUN 14 06/04/2011 0936   CREATININE 0.64 06/04/2011 0936      Component Value Date/Time   CALCIUM 8.1* 06/04/2011 0936   ALKPHOS 126* 06/04/2011 0936   AST 19 06/04/2011 0936   ALT 19 06/04/2011 0936   BILITOT 0.3 06/04/2011 0936       Lab Results  Component Value Date   WBC 9.2 06/04/2011   HGB 12.3 06/04/2011   HCT 37.0 06/04/2011   MCV 84.8 06/04/2011   PLT 184 06/04/2011   NEUTROABS 6.7* 06/04/2011    Studies/Results:  Nm Pet Image Restag (ps) Skull Base To Thigh  06/04/2011  *RADIOLOGY REPORT*  Clinical Data: Subsequent treatment strategy for recurrent Hodgkins lymphoma.  Prior history of ovarian carcinoma  NUCLEAR MEDICINE PET CT RESTAGING (PS) SKULL BASE TO THIGH  Technique:   16.3 mCi F-18 FDG was injected intravenously via the right hand.  Full-ring PET imaging was performed from the skull base through the mid-thighs 69  minutes after injection.  CT data was obtained and used for attenuation correction and anatomic localization only.  (This was not acquired as a diagnostic CT examination.)  Fasting Blood Glucose:  81  Patient Weight:  190 pounds.  Comparison: 09/21/2010  Findings: No hypermetabolic soft tissue masses or lymphadenopathy identified within the neck, chest, abdomen, or pelvis.  Corresponding noncontrast CT images show stable small left pleural effusion versus pleural thickening, which shows no  hypermetabolic activity. A small hiatal hernia is again noted.  No suspicious pulmonary nodules are identified by CT.  IMPRESSION: Stable exam.  No evidence of recurrent lymphoma.  Original Report Authenticated By: Danae Orleans, M.D.    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 Chemotherapy was completed in  August of 2010. 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   Plan: She remains in clinical remission now close to 2 years out from her transplant. She is not "back to normal", however. I think she needs to exercise regularly, and today we talked about a diet that would be high in vegetables and low in carbohydrates. I think her goal should be losing a half a pound a month indefinitely until she gets closer to her ideal body weight.  Of course if she has other problems, asthma and osteoporosis in particular and these are being managed through Dr. Cliffton Asters. She'll see [place] again in June and then January, with physical exam and lab work at each visit. We will consider repeating a PET scan at the 5 year mark from her transplantation.  Lashawne Dura C 06/11/2011

## 2011-11-11 ENCOUNTER — Other Ambulatory Visit: Payer: 59 | Admitting: Lab

## 2011-11-18 ENCOUNTER — Ambulatory Visit: Payer: 59 | Admitting: Physician Assistant

## 2011-11-18 ENCOUNTER — Other Ambulatory Visit: Payer: 59 | Admitting: Lab

## 2011-11-18 NOTE — Progress Notes (Signed)
[facility] today.  Letter mailed to patient.  

## 2011-12-17 ENCOUNTER — Other Ambulatory Visit (HOSPITAL_BASED_OUTPATIENT_CLINIC_OR_DEPARTMENT_OTHER): Payer: Medicare Other | Admitting: Lab

## 2011-12-17 ENCOUNTER — Ambulatory Visit (HOSPITAL_BASED_OUTPATIENT_CLINIC_OR_DEPARTMENT_OTHER): Payer: Medicare Other | Admitting: Physician Assistant

## 2011-12-17 ENCOUNTER — Encounter: Payer: Self-pay | Admitting: Physician Assistant

## 2011-12-17 ENCOUNTER — Telehealth: Payer: Self-pay | Admitting: Oncology

## 2011-12-17 VITALS — BP 110/70 | HR 82 | Temp 98.0°F | Ht 61.0 in | Wt 196.8 lb

## 2011-12-17 DIAGNOSIS — C8119 Nodular sclerosis classical Hodgkin lymphoma, extranodal and solid organ sites: Secondary | ICD-10-CM

## 2011-12-17 DIAGNOSIS — C8110 Nodular sclerosis classical Hodgkin lymphoma, unspecified site: Secondary | ICD-10-CM

## 2011-12-17 LAB — CBC WITH DIFFERENTIAL/PLATELET
BASO%: 0.7 % (ref 0.0–2.0)
Eosinophils Absolute: 0.2 10*3/uL (ref 0.0–0.5)
MCV: 85.6 fL (ref 79.5–101.0)
MONO%: 5 % (ref 0.0–14.0)
NEUT#: 5.1 10*3/uL (ref 1.5–6.5)
RBC: 4.31 10*6/uL (ref 3.70–5.45)
RDW: 17 % — ABNORMAL HIGH (ref 11.2–14.5)
WBC: 7.3 10*3/uL (ref 3.9–10.3)

## 2011-12-17 LAB — LACTATE DEHYDROGENASE: LDH: 88 U/L — ABNORMAL LOW (ref 94–250)

## 2011-12-17 LAB — COMPREHENSIVE METABOLIC PANEL
ALT: 20 U/L (ref 0–35)
AST: 19 U/L (ref 0–37)
Albumin: 3.5 g/dL (ref 3.5–5.2)
Alkaline Phosphatase: 121 U/L — ABNORMAL HIGH (ref 39–117)
Glucose, Bld: 193 mg/dL — ABNORMAL HIGH (ref 70–99)
Potassium: 3.9 mEq/L (ref 3.5–5.3)
Sodium: 139 mEq/L (ref 135–145)
Total Protein: 6 g/dL (ref 6.0–8.3)

## 2011-12-17 NOTE — Telephone Encounter (Signed)
gve the pt her jan 2014 appt calendar °

## 2011-12-17 NOTE — Progress Notes (Signed)
ID: Erika [name]   DOB: Jan 28, 1946  MR#: [id number]  NUU#:[id number]  HISTORY OF PRESENT ILLNESS: Erika [name] had been feeling tired for a year or two, that being more tired than would be normal she feels.  She has also lost approximately 60 pounds in the last two years.  She interpreted that as "a gift from God", and was just delighted, but then more recently, in addition to the weight loss and fatigue, she started having nausea and vomiting.  She was also having drenching night sweats, which she interprets, possibly accurately, as hot flashes, but possibly they could be due to the tumor.  In any case, she saw Dr. Cliffton Asters for this, and Dr. Cliffton Asters thought she really deserved admission for further evaluation.  This was done on June 13, 2008.  Studies that admission included an admission CBC - white cells 20.2, hemoglobin 11.6, and platelets 123,000.  Coags were normal, and CMP was normal except for an alkaline phosphatase of 215, and an ALT of 37.  Calcium was 9.0, albumin 2.4, creatinine 0.60.  LDH was 125, and the ionized calcium was normal at 1.2.  Anemia panel showed a reticulocyte count of 65, a B12 of 434, a folate of 9 and a ferritin of 261.  ESR was 102, CA-125, CEA and CA19 were all normal, as was urine immunofixation and serum protein electrophoresis and immunofixation.  Her total IgA was 637, which is elevated. Her total IgG was 1280, and her total IgM was 284, which is minimally elevated.  Multiple cultures including blood x two, bronchial washings for AFB and urine were negative, or in the case of urine, non-informative.    On June 17, 2008 she underwent bronchoscopy with the pathology (C10-127, C10-126, and C10-119) showing a few atypical cells, but basically being nondiagnostic.  During this admission the patient had a CT of the chest, which showed scattered bilateral scarring and left lower lobe atelectasis, as well as some hilar and subcarinal adenopathy.  There was a 3 cm hepatic lobe lesion in  the right hepatic lobe, and a second smaller lesion in the same area.  The spleen appeared normal.  There were multiple retroperitoneal surgical clips from her prior surgery for ovarian cancer, and multiple mildly enlarged retroperitoneal lymph nodes.  The pelvis was status post hysterectomy, but no significant adenopathy, and the largest nodal mass measures 4.9 x 2.8 cm in the subcarinal area.  There were also multiple abnormal axillary lymph nodes noted, and there were multiple bilateral sub-centimeter pulmonary nodules predominant in the upper lobes, the largest measuring 7 mm.    On June 19, 2008, the patient had an upper endoscopy by Dr. Bosie Clos, which showed mild gastritis, small hiatal hernia and a mildly tortuous esophagus, but no stricture or malignancy.  On July 01, 2008, the patient has ultrasound-guided biopsy of a right supraclavicular lymph node, which showed (S10-662 and C10-212) predominantly blood and fibrous adipose tissue.    Finally, on July 11, 2008, the patient underwent excision of a deep left axillary lymph node under Dr. Claud Kelp, and the pathology there [id number] and FC10-60) showed classical Hodgkin's lymphoma, most likely nodular sclerosis type in cellular phase.  The flow cytometry showed no monoclonal B-cell population or abnormal T-cell phenotype, and the cells in question were CD30, CD15 positive, and negative for CD45, CD3 and other markers.    The patient was further staged with a PET scan obtained June 29, 2008.  This showed extensive hypermetabolic lymphadenopathy in the soft tissues of  the neck, axilla, mediastinum, retroperitoneum and small bowel mesentery.  There was a hypermetabolic left upper lobe nodule measuring 7.3 mm with an SUV of 6.3. There was a large focus of intense involvement in the thoracic spine, suggestive of bone involvement.  The right hepatic lobe 2.9 cm lesion had an SUV of 35.6.  There were at least two foci of mildly increased  FDG uptake in the spleen as well.    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 History of treatment is as noted below.   INTERVAL HISTORY: Erika [name] returns today for routine six-month followup of her Hodgkin's lymphoma. The family is doing well. Erika [name] herself is staying busy, and interval history is unremarkable.  REVIEW OF SYSTEMS: Physically, Erika [name] had few complaints. She continues to gain weight, but admits that she has not changed her way of eating, and is not exercising regularly. We did spend some time discussing this today. She's developed some tremors in her right hand, being followed by her primary care physician. Thus far, this is not affecting any of her day-to-day activities, and she has noted no additional neurologic symptoms. She has no abnormal headaches. No weakness in the extremities.  Erika [name] denies any fevers, chills, drenching night sweats, rashes, skin changes, or itching. She's had no signs of abnormal bleeding. She's eating and drinking well with no nausea or change in bowel habits. She continues to have urinary incontinence which is not new. She's had no increased cough and no increased shortness of breath. No chest pain or palpitations. No increased pain, although she continues to have some chronic back pain.  A detailed review of systems is otherwise [name] and noncontributory.   PAST MEDICAL HISTORY: Past Medical History  Diagnosis Date  . hodgkins lymphoma dx'd 07/14/09 & 05/2009    chemo comp; bone marrow transplant  . Asthma   Depression.  She has a history of migraines, although these are rare.  She has a history of asthma, which is treated by Dr. Cliffton Asters, a history of tobacco abuse, the patient quitting in 1992, smoking about one-half pack per day for perhaps 30 years.  There is a history of ovarian cancer, and she underwent TAH/BSO in 1999.  I do not have those records.  She also had at least  partial omentectomy by her account.  This was done under Dr. Arnell [name] and Dr. De Blanch.  The patient was told that she did not need any adjuvant treatment, and indeed she has been disease free now for more than ten years.  She has a small hiatal hernia.  She has significant osteoarthritis involving the knees, which makes it difficult for her to ambulate.  She has a history of hypercholesterolemia, and she has a history of stasis dermatitis involving the left ankle.    PAST SURGICAL HISTORY: History reviewed. No pertinent past surgical history.   FAMILY HISTORY History reviewed. No pertinent family history. The patient's father died at the age of 37 from congestive heart failure.  The patient's mother died at the age of 75 with Alzheimer's disease.  The patient has one brother and one sister, but there is no other cancer in the family.   GYNECOLOGIC HISTORY: She is GX P1, first pregnancy to term at age 51.  After her hysterectomy and salpingo oophorectomy she started estrogen replacement on which she continues.  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  Her husband, Erika [name],  present today, was a Production designer, theatre/television/film for [employer].  The patient has a son from an earlier marriage, Erika [name], who lives in [place], [place], and works in IT for a hospital there.  Erika [name] has four children of his own all in this area.  There are a total of 14 grandchildren.  The patient attends [employer], and is very active in that community.    ADVANCED DIRECTIVES:  HEALTH MAINTENANCE: History  Substance Use Topics  . Smoking status: Former Smoker    Quit date: 05/27/1992  . Smokeless tobacco: Never Used  . Alcohol Use: Yes     socially     Colonoscopy:  PAP:  Bone density:  Lipid panel:  Allergies  Allergen Reactions  . Cephalexin     Current Outpatient Prescriptions  Medication Sig Dispense Refill  . ARIPiprazole (ABILIFY) 10 MG tablet Take 5 mg by mouth  daily.       Marland Kitchen azelastine (ASTELIN) 137 MCG/SPRAY nasal spray Place 1 spray into the nose 2 (two) times daily. Use in each nostril as directed      . cetirizine (ZYRTEC) 10 MG tablet Take 10 mg by mouth 2 (two) times daily.       . clonazePAM (KLONOPIN) 0.5 MG tablet Take 0.5 mg by mouth.      . ENABLEX 7.5 MG 24 hr tablet       . escitalopram (LEXAPRO) 20 MG tablet Take 20 mg by mouth daily.      Marland Kitchen esomeprazole (NEXIUM) 40 MG capsule Take 40 mg by mouth daily before breakfast.      . fluticasone-salmeterol (ADVAIR HFA) 115-21 MCG/ACT inhaler Inhale 2 puffs into the lungs 2 (two) times daily.      . montelukast (SINGULAIR) 10 MG tablet Take 10 mg by mouth at bedtime.      . pirbuterol (MAXAIR) 200 MCG/INH inhaler Inhale 2 puffs into the lungs 4 (four) times daily.      . pramipexole (MIRAPEX) 0.5 MG tablet       . zolpidem (AMBIEN) 10 MG tablet Take 10 mg by mouth at bedtime as needed.        OBJECTIVE: Middle-aged white female who appears comfortable, in no acute distress. Filed Vitals:   12/17/11 1328  BP: 110/70  Pulse: 82  Temp: 98 F (36.7 C)     Body mass index is 37.19 kg/(m^2).    ECOG FS: 1 Filed Weights   12/17/11 1328  Weight: 196 lb 12.8 oz (89.268 kg)   Physical Exam: HEENT:  Sclerae anicteric, conjunctivae pink.  Oropharynx clear.    Nodes:  No cervical, supraclavicular, or axillary lymphadenopathy palpated.  Breast Exam: Benign, with no suspicious masses, skin changes, or nipple inversion.   Lungs:  Clear to auscultation bilaterally.  No crackles, rhonchi, or wheezes.   Heart:  Regular rate and rhythm.   Abdomen:  Soft, obese, nontender.  Positive bowel sounds.  No organomegaly or masses palpated.   Musculoskeletal:  No focal spinal tenderness to palpation.  Extremities:  Benign.  No peripheral edema or cyanosis.   Skin:  Benign.   Neuro:  Nonfocal. Alert and oriented x3.    LAB RESULTS: Lab Results  Component Value Date   WBC 7.3 12/17/2011   NEUTROABS 5.1  12/17/2011   HGB 12.0 12/17/2011   HCT 36.9 12/17/2011   MCV 85.6 12/17/2011   PLT 205 12/17/2011      Chemistry      Component Value Date/Time   NA 137  06/04/2011 0936   K 4.3 06/04/2011 0936   CL 105 06/04/2011 0936   CO2 24 06/04/2011 0936   BUN 14 06/04/2011 0936   CREATININE 0.64 06/04/2011 0936      Component Value Date/Time   CALCIUM 8.1* 06/04/2011 0936   ALKPHOS 126* 06/04/2011 0936   AST 19 06/04/2011 0936   ALT 19 06/04/2011 0936   BILITOT 0.3 06/04/2011 0936     CMET, LDH and Uric Acid have all been repeated today, 12/17/2011, with results pending.   STUDIES: No results found.  ASSESSMENT: 66 y.o. [place] woman   (1)  with a history of nodular sclerosing Hodgkin's disease, clinically stage IV at presentation, treated with 6 cycles of AVD (the bleomycin was held secondary to concerns regarding possible pulmonary toxicity). Chemotherapy was completed in August of 2010.   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   PLAN: Jobeth appears to be doing quite well with no clinical evidence of disease recurrence. She'll continue to be followed by her other physicians including Dr. Cliffton Asters. She return to see [place] in 6 months and we will repeat labs again at that time. I did suggest ordering a screening mammogram (her last was in October of 2010) but she declines.  Erika [name] will call us with any changes or problems prior to her next scheduled appointment.    Shabazz Mckey    12/17/2011

## 2012-06-15 ENCOUNTER — Other Ambulatory Visit (HOSPITAL_BASED_OUTPATIENT_CLINIC_OR_DEPARTMENT_OTHER): Payer: Medicare Other | Admitting: Lab

## 2012-06-15 DIAGNOSIS — C8119 Nodular sclerosis classical Hodgkin lymphoma, extranodal and solid organ sites: Secondary | ICD-10-CM

## 2012-06-15 DIAGNOSIS — C8110 Nodular sclerosis classical Hodgkin lymphoma, unspecified site: Secondary | ICD-10-CM

## 2012-06-15 LAB — CBC WITH DIFFERENTIAL/PLATELET
BASO%: 0.5 % (ref 0.0–2.0)
EOS%: 2.3 % (ref 0.0–7.0)
Eosinophils Absolute: 0.2 10*3/uL (ref 0.0–0.5)
LYMPH%: 14.1 % (ref 14.0–49.7)
MCH: 27.4 pg (ref 25.1–34.0)
MCHC: 33 g/dL (ref 31.5–36.0)
MCV: 82.9 fL (ref 79.5–101.0)
MONO%: 5.1 % (ref 0.0–14.0)
Platelets: 228 10*3/uL (ref 145–400)
RBC: 4.75 10*6/uL (ref 3.70–5.45)

## 2012-06-15 LAB — COMPREHENSIVE METABOLIC PANEL (CC13)
Albumin: 3.1 g/dL — ABNORMAL LOW (ref 3.5–5.0)
CO2: 22 mEq/L (ref 22–29)
Glucose: 129 mg/dl — ABNORMAL HIGH (ref 70–99)
Sodium: 136 mEq/L (ref 136–145)
Total Bilirubin: 0.26 mg/dL (ref 0.20–1.20)
Total Protein: 7.1 g/dL (ref 6.4–8.3)

## 2012-06-15 LAB — LACTATE DEHYDROGENASE (CC13): LDH: 118 U/L — ABNORMAL LOW (ref 125–245)

## 2012-06-15 LAB — URIC ACID (CC13): Uric Acid, Serum: 3.9 mg/dl (ref 2.6–7.4)

## 2012-06-22 ENCOUNTER — Ambulatory Visit (HOSPITAL_BASED_OUTPATIENT_CLINIC_OR_DEPARTMENT_OTHER): Payer: Medicare Other | Admitting: Oncology

## 2012-06-22 ENCOUNTER — Telehealth: Payer: Self-pay | Admitting: Oncology

## 2012-06-22 VITALS — BP 131/80 | HR 88 | Temp 98.3°F | Resp 20 | Ht 61.0 in | Wt 197.0 lb

## 2012-06-22 DIAGNOSIS — C8119 Nodular sclerosis classical Hodgkin lymphoma, extranodal and solid organ sites: Secondary | ICD-10-CM

## 2012-06-22 DIAGNOSIS — C8110 Nodular sclerosis classical Hodgkin lymphoma, unspecified site: Secondary | ICD-10-CM

## 2012-06-22 NOTE — Progress Notes (Signed)
ID: Erika [name]   DOB: March 30, 1946  MR#: [id number]  WUJ#:[id number]  PCP: Gifford Shave White GYN: SU: OTHER MD: Emerson Monte, Scott McDiarmid, Ollen Gross  HISTORY OF PRESENT ILLNESS: Erika [name] had been feeling tired for a year or two, that being more tired than would be normal she feels.  She has also lost approximately 60 pounds in the last two years.  She interpreted that as "a gift from God", and was just delighted, but then more recently, in addition to the weight loss and fatigue, she started having nausea and vomiting.  She was also having drenching night sweats, which she interprets, possibly accurately, as hot flashes, but possibly they could be due to the tumor.  In any case, she saw Dr. Cliffton Asters for this, and Dr. Cliffton Asters thought she really deserved admission for further evaluation.  This was done on June 13, 2008.  Studies that admission included an admission CBC - white cells 20.2, hemoglobin 11.6, and platelets 123,000.  Coags were normal, and CMP was normal except for an alkaline phosphatase of 215, and an ALT of 37.  Calcium was 9.0, albumin 2.4, creatinine 0.60.  LDH was 125, and the ionized calcium was normal at 1.2.  Anemia panel showed a reticulocyte count of 65, a B12 of 434, a folate of 9 and a ferritin of 261.  ESR was 102, CA-125, CEA and CA19 were all normal, as was urine immunofixation and serum protein electrophoresis and immunofixation.  Her total IgA was 637, which is elevated. Her total IgG was 1280, and her total IgM was 284, which is minimally elevated.  Multiple cultures including blood x two, bronchial washings for AFB and urine were negative, or in the case of urine, non-informative.    On June 17, 2008 she underwent bronchoscopy with the pathology (C10-127, C10-126, and C10-119) showing a few atypical cells, but basically being nondiagnostic.  During this admission the patient had a CT of the chest, which showed scattered bilateral scarring and left lower lobe atelectasis,  as well as some hilar and subcarinal adenopathy.  There was a 3 cm hepatic lobe lesion in the right hepatic lobe, and a second smaller lesion in the same area.  The spleen appeared normal.  There were multiple retroperitoneal surgical clips from her prior surgery for ovarian cancer, and multiple mildly enlarged retroperitoneal lymph nodes.  The pelvis was status post hysterectomy, but no significant adenopathy, and the largest nodal mass measures 4.9 x 2.8 cm in the subcarinal area.  There were also multiple abnormal axillary lymph nodes noted, and there were multiple bilateral sub-centimeter pulmonary nodules predominant in the upper lobes, the largest measuring 7 mm.    On June 19, 2008, the patient had an upper endoscopy by Dr. Bosie Clos, which showed mild gastritis, small hiatal hernia and a mildly tortuous esophagus, but no stricture or malignancy.  On July 01, 2008, the patient has ultrasound-guided biopsy of a right supraclavicular lymph node, which showed (S10-662 and C10-212) predominantly blood and fibrous adipose tissue.    Finally, on July 11, 2008, the patient underwent excision of a deep left axillary lymph node under Dr. 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  The flow cytometry showed no monoclonal B-cell population or abnormal T-cell phenotype, and the cells in question were CD30, CD15 positive, and negative for CD45, CD3 and other markers.    The patient was further staged with a PET scan obtained  June 29, 2008.  This showed extensive hypermetabolic lymphadenopathy in the soft tissues of the neck, axilla, mediastinum, retroperitoneum and small bowel mesentery.  There was a hypermetabolic left upper lobe nodule measuring 7.3 mm with an SUV of 6.3. There was a large focus of intense involvement in the thoracic spine, suggestive of bone involvement.  The right hepatic  lobe 2.9 cm lesion had an SUV of 35.6.  There were at least two foci of mildly increased FDG uptake in the spleen as well.    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 History of treatment is as noted below.   INTERVAL HISTORY: Erika [name] returns today for followup of her Hodgkin's lymphoma. Interval history is unremarkable. "I'm fine, and healthy".  REVIEW OF SYSTEMS: She does have a variety of complaints that are not related to her Hodgkin's diagnosis. She has knee pain, bladder incontinence, and some problems with depression. She is taking Fosamax for her bone density and tolerates that well. She has had no fevers, rash, unexplained fatigue or weight loss, drenching sweats, or adenopathy. A detailed review of systems was otherwise noncontributory  PAST MEDICAL HISTORY: Past Medical History  Diagnosis Date  . hodgkins lymphoma dx'd 07/14/09 & 05/2009    chemo comp; bone marrow transplant  . Asthma   Depression.  She has a history of migraines, although these are rare.  She has a history of asthma, which is treated by Dr. Cliffton Asters, a history of tobacco abuse, the patient quitting in 1992, smoking about one-half pack per day for perhaps 30 years.  There is a history of ovarian cancer, and she underwent TAH/BSO in 1999.  I do not have those records.  She also had at least partial omentectomy by her account.  This was done under Dr. Arnell [name] and Dr. De Blanch.  The patient was told that she did not need any adjuvant treatment, and indeed she has been disease free now for more than ten years.  She has a small hiatal hernia.  She has significant osteoarthritis involving the knees, which makes it difficult for her to ambulate.  She has a history of hypercholesterolemia, and she has a history of stasis dermatitis involving the left ankle.    PAST SURGICAL HISTORY: No past surgical history on file.   FAMILY HISTORY No  family history on file. The patient's father died at the age of 36 from congestive heart failure.  The patient's mother died at the age of 22 with Alzheimer's disease.  The patient has one brother and one sister, but there is no other cancer in the family.   GYNECOLOGIC HISTORY: She is GX P1, first pregnancy to term at age 80.  After her hysterectomy and salpingo oophorectomy she started estrogen replacement on which she continues.  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  Her husband, Annette Stable, present today, was a Production designer, theatre/television/film for [employer].  The patient has a son from an earlier marriage, Tinnie Gens, who lives in [place], [place], and works in IT for a hospital there.  Annette Stable has four children of his own all in this area.  There are a total of 14 grandchildren.  The patient attends [employer], and is very active in that community.    ADVANCED DIRECTIVES:  HEALTH MAINTENANCE: History  Substance Use Topics  . Smoking status: Former Smoker    Quit date: 05/27/1992  . Smokeless tobacco: Never Used  .  Alcohol Use: Yes     Comment: socially     Colonoscopy:  PAP:  Bone density:  Lipid panel:  Allergies  Allergen Reactions  . Cephalexin     Current Outpatient Prescriptions  Medication Sig Dispense Refill  . ARIPiprazole (ABILIFY) 10 MG tablet Take 5 mg by mouth daily.       Marland Kitchen azelastine (ASTELIN) 137 MCG/SPRAY nasal spray Place 1 spray into the nose 2 (two) times daily. Use in each nostril as directed      . cetirizine (ZYRTEC) 10 MG tablet Take 10 mg by mouth 2 (two) times daily.       . clonazePAM (KLONOPIN) 0.5 MG tablet Take 0.5 mg by mouth.      . ENABLEX 7.5 MG 24 hr tablet       . escitalopram (LEXAPRO) 20 MG tablet Take 20 mg by mouth daily.      Marland Kitchen esomeprazole (NEXIUM) 40 MG capsule Take 40 mg by mouth daily before breakfast.      . fluticasone-salmeterol (ADVAIR HFA) 115-21 MCG/ACT inhaler Inhale 2 puffs into the lungs 2 (two) times  daily.      . montelukast (SINGULAIR) 10 MG tablet Take 10 mg by mouth at bedtime.      . pirbuterol (MAXAIR) 200 MCG/INH inhaler Inhale 2 puffs into the lungs 4 (four) times daily.      . pramipexole (MIRAPEX) 0.5 MG tablet       . zolpidem (AMBIEN) 10 MG tablet Take 10 mg by mouth at bedtime as needed.        OBJECTIVE: Middle-aged white woman in no acute distress. Filed Vitals:   06/22/12 1114  BP: 131/80  Pulse: 88  Temp: 98.3 F (36.8 C)  Resp: 20     Body mass index is 37.22 kg/(m^2).    ECOG FS: 1 Filed Weights   06/22/12 1114  Weight: 197 lb (89.359 kg)   Sclerae unicteric Oropharynx clear No cervical or supraclavicular adenopathy Lungs no rales or rhonchi Heart regular rate and rhythm Abd benign MSK kyphosis but no focal spinal tenderness Neuro: nonfocal, well oriented, positive affect Breasts: Deferred     LAB RESULTS: Lab Results  Component Value Date   WBC 9.1 06/15/2012   NEUTROABS 7.1* 06/15/2012   HGB 13.0 06/15/2012   HCT 39.4 06/15/2012   MCV 82.9 06/15/2012   PLT 228 06/15/2012      Chemistry      Component Value Date/Time   NA 136 06/15/2012 1257   NA 139 12/17/2011 1314   K 4.0 06/15/2012 1257   K 3.9 12/17/2011 1314   CL 105 06/15/2012 1257   CL 106 12/17/2011 1314   CO2 22 06/15/2012 1257   CO2 25 12/17/2011 1314   BUN 10.0 06/15/2012 1257   BUN 13 12/17/2011 1314   CREATININE 0.8 06/15/2012 1257   CREATININE 0.67 12/17/2011 1314      Component Value Date/Time   CALCIUM 9.1 06/15/2012 1257   CALCIUM 8.6 12/17/2011 1314   ALKPHOS 147 06/15/2012 1257   ALKPHOS 121* 12/17/2011 1314   AST 19 06/15/2012 1257   AST 19 12/17/2011 1314   ALT 18 06/15/2012 1257   ALT 20 12/17/2011 1314   BILITOT 0.26 06/15/2012 1257   BILITOT 0.3 12/17/2011 1314      STUDIES: No results found.  ASSESSMENT: 67 y.o. [place] woman   (1)  with a history of nodular sclerosing Hodgkin's disease, clinically stage IV at presentation, treated with 6 cycles of AVD (  the  bleomycin was held secondary to concerns regarding possible pulmonary toxicity). Chemotherapy was completed in August of 2010.   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   PLAN: Erika [name] is doing fine as far as her Hodgkin's disease is concerned, with no evidence of disease recurrence or activity now nearly 3 years out from her autologous transplant. She's can see [place] again in June, and then she will followup with Dr. Cliffton Asters in December. She will continue to see [place] on a yearly basis, every June, until she completes her 5 years of followup. Both for that last visit we will obtain a repeat PET scan.   Annalynne Ibanez C    06/22/2012

## 2012-06-22 NOTE — Telephone Encounter (Signed)
gv pt appt schedule for June.  °

## 2012-11-10 ENCOUNTER — Telehealth: Payer: Self-pay | Admitting: *Deleted

## 2012-11-10 NOTE — Telephone Encounter (Signed)
Pt called to cancel her appt for 11/12/12 and to r/s. gv appt d/t for 11/18/12 with labs@1 :15pm and ov @ 1:45pm. Pt is aware...td

## 2012-11-12 ENCOUNTER — Ambulatory Visit: Payer: Medicare Other | Admitting: Physician Assistant

## 2012-11-12 ENCOUNTER — Other Ambulatory Visit: Payer: Medicare Other | Admitting: Lab

## 2012-11-16 ENCOUNTER — Telehealth: Payer: Self-pay | Admitting: *Deleted

## 2012-11-16 NOTE — Telephone Encounter (Signed)
Pt called to cancel her appts for 11/18/12 and r/s. gv appt d/t for 11/24/12 labs @ 8:45am and ov @ 9:15am.the patient is aware...td

## 2012-11-18 ENCOUNTER — Ambulatory Visit: Payer: Medicare Other | Admitting: Physician Assistant

## 2012-11-18 ENCOUNTER — Other Ambulatory Visit: Payer: Medicare Other | Admitting: Lab

## 2012-11-23 ENCOUNTER — Other Ambulatory Visit: Payer: Self-pay | Admitting: Physician Assistant

## 2012-11-23 DIAGNOSIS — C8110 Nodular sclerosis classical Hodgkin lymphoma, unspecified site: Secondary | ICD-10-CM

## 2012-11-24 ENCOUNTER — Telehealth: Payer: Self-pay | Admitting: *Deleted

## 2012-11-24 ENCOUNTER — Ambulatory Visit (HOSPITAL_BASED_OUTPATIENT_CLINIC_OR_DEPARTMENT_OTHER): Payer: Medicare Other | Admitting: Physician Assistant

## 2012-11-24 ENCOUNTER — Other Ambulatory Visit (HOSPITAL_BASED_OUTPATIENT_CLINIC_OR_DEPARTMENT_OTHER): Payer: Medicare Other | Admitting: Lab

## 2012-11-24 ENCOUNTER — Encounter: Payer: Self-pay | Admitting: Physician Assistant

## 2012-11-24 VITALS — BP 112/76 | HR 80 | Temp 98.0°F | Resp 20 | Ht 61.0 in | Wt 198.2 lb

## 2012-11-24 DIAGNOSIS — C8110 Nodular sclerosis classical Hodgkin lymphoma, unspecified site: Secondary | ICD-10-CM

## 2012-11-24 DIAGNOSIS — C8119 Nodular sclerosis classical Hodgkin lymphoma, extranodal and solid organ sites: Secondary | ICD-10-CM

## 2012-11-24 LAB — CBC WITH DIFFERENTIAL/PLATELET
BASO%: 0.6 % (ref 0.0–2.0)
EOS%: 3.3 % (ref 0.0–7.0)
HGB: 12.9 g/dL (ref 11.6–15.9)
MCH: 27.2 pg (ref 25.1–34.0)
MCHC: 33.1 g/dL (ref 31.5–36.0)
RBC: 4.74 10*6/uL (ref 3.70–5.45)
RDW: 17.3 % — ABNORMAL HIGH (ref 11.2–14.5)
lymph#: 1.2 10*3/uL (ref 0.9–3.3)

## 2012-11-24 LAB — COMPREHENSIVE METABOLIC PANEL (CC13)
ALT: 15 U/L (ref 0–55)
AST: 15 U/L (ref 5–34)
Alkaline Phosphatase: 124 U/L (ref 40–150)
Creatinine: 0.7 mg/dL (ref 0.6–1.1)
Sodium: 138 mEq/L (ref 136–145)
Total Bilirubin: 0.21 mg/dL (ref 0.20–1.20)
Total Protein: 6.6 g/dL (ref 6.4–8.3)

## 2012-11-24 NOTE — Progress Notes (Signed)
ID: Erika [name]   DOB: [birth year]  MR#: [id number]  ONG#:[id number]  PCP: Gifford Shave White GYN: SU: OTHER MD: Emerson Monte, Scott McDiarmid, Ollen Gross  HISTORY OF PRESENT ILLNESS: Erika [name] had been feeling tired for a year or two, that being more tired than would be normal she feels.  She has also lost approximately 60 pounds in the last two years.  She interpreted that as "a gift from God", and was just delighted, but then more recently, in addition to the weight loss and fatigue, she started having nausea and vomiting.  She was also having drenching night sweats, which she interprets, possibly accurately, as hot flashes, but possibly they could be due to the tumor.  In any case, she saw Dr. Cliffton Asters for this, and Dr. Cliffton Asters thought she really deserved admission for further evaluation.  This was done on June 13, 2008.  Studies that admission included an admission CBC - white cells 20.2, hemoglobin 11.6, and platelets 123,000.  Coags were normal, and CMP was normal except for an alkaline phosphatase of 215, and an ALT of 37.  Calcium was 9.0, albumin 2.4, creatinine 0.60.  LDH was 125, and the ionized calcium was normal at 1.2.  Anemia panel showed a reticulocyte count of 65, a B12 of 434, a folate of 9 and a ferritin of 261.  ESR was 102, CA-125, CEA and CA19 were all normal, as was urine immunofixation and serum protein electrophoresis and immunofixation.  Her total IgA was 637, which is elevated. Her total IgG was 1280, and her total IgM was 284, which is minimally elevated.  Multiple cultures including blood x two, bronchial washings for AFB and urine were negative, or in the case of urine, non-informative.    On June 17, 2008 she underwent bronchoscopy with the pathology (C10-127, C10-126, and C10-119) showing a few atypical cells, but basically being nondiagnostic.  During this admission the patient had a CT of the chest, which showed scattered bilateral scarring and left lower lobe atelectasis,  as well as some hilar and subcarinal adenopathy.  There was a 3 cm hepatic lobe lesion in the right hepatic lobe, and a second smaller lesion in the same area.  The spleen appeared normal.  There were multiple retroperitoneal surgical clips from her prior surgery for ovarian cancer, and multiple mildly enlarged retroperitoneal lymph nodes.  The pelvis was status post hysterectomy, but no significant adenopathy, and the largest nodal mass measures 4.9 x 2.8 cm in the subcarinal area.  There were also multiple abnormal axillary lymph nodes noted, and there were multiple bilateral sub-centimeter pulmonary nodules predominant in the upper lobes, the largest measuring 7 mm.    On June 19, 2008, the patient had an upper endoscopy by Dr. Bosie Clos, which showed mild gastritis, small hiatal hernia and a mildly tortuous esophagus, but no stricture or malignancy.  On July 01, 2008, the patient has ultrasound-guided biopsy of a right supraclavicular lymph node, which showed (S10-662 and C10-212) predominantly blood and fibrous adipose tissue.    Finally, on July 11, 2008, the patient underwent excision of a deep left axillary lymph node under Dr. 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  The flow cytometry showed no monoclonal B-cell population or abnormal T-cell phenotype, and the cells in question were CD30, CD15 positive, and negative for CD45, CD3 and other markers.    The patient was further staged with a PET scan obtained  June 29, 2008.  This showed extensive hypermetabolic lymphadenopathy in the soft tissues of the neck, axilla, mediastinum, retroperitoneum and small bowel mesentery.  There was a hypermetabolic left upper lobe nodule measuring 7.3 mm with an SUV of 6.3. There was a large focus of intense involvement in the thoracic spine, suggestive of bone involvement.  The right hepatic  lobe 2.9 cm lesion had an SUV of 35.6.  There were at least two foci of mildly increased FDG uptake in the spleen as well.    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 History of treatment is as noted below.   INTERVAL HISTORY: Erika [name] returns today for followup of her Hodgkin's lymphoma.  Overall, Erika [name] is feeling very well, and interval history is unremarkable. She does have a new great granddaughter, Erika [name], now 49 weeks old. Erika [name] is keeping the baby a couple of days each week for which she enjoys.  She is concerned about her weight, and is trying to "eat more salads". She knows she should be exercising, but finds it difficult to be motivated to do so.  REVIEW OF SYSTEMS: Erika [name] denies any recent illnesses and has had no fevers, chills, or drenching night sweats. She is occasional hot flashes, but nothing problematic. She has noted no "lumps or bumps", and no adenopathy. Her energy level is fair. Her appetite is good and she denies any problems with nausea or change in bowel or bladder habits. She does have a history of asthma with an occasional cough and shortness of breath with exertion, but this is chronic and has not worsened. She's had no chest pain or palpitations. She denies abnormal headaches, dizziness, or change in vision. She still has some chronic back pain which is stable, but denies any new or unusual myalgias, arthralgias, or bony pain. She has no signs of peripheral neuropathy, and no peripheral edema.   A detailed review of systems was otherwise noncontributory   PAST MEDICAL HISTORY: Past Medical History  Diagnosis Date  . hodgkins lymphoma dx'd 07/14/09 & 05/2009    chemo comp; bone marrow transplant  . Asthma   Depression.  She has a history of migraines, although these are rare.  She has a history of asthma, which is treated by Dr. Cliffton Asters, a history of tobacco abuse, the patient quitting  in 1992, smoking about one-half pack per day for perhaps 30 years.  There is a history of ovarian cancer, and she underwent TAH/BSO in 1999.  I do not have those records.  She also had at least partial omentectomy by her account.  This was done under Dr. Arnell [name] and Dr. De Blanch.  The patient was told that she did not need any adjuvant treatment, and indeed she has been disease free now for more than ten years.  She has a small hiatal hernia.  She has significant osteoarthritis involving the knees, which makes it difficult for her to ambulate.  She has a history of hypercholesterolemia, and she has a history of stasis dermatitis involving the left ankle.    PAST SURGICAL HISTORY: No past surgical history on file.   FAMILY HISTORY No family history on file. The patient's father died at the age of 72 from congestive heart failure.  The patient's mother died at the age of 67 with Alzheimer's disease.  The patient has one brother and one sister, but there is no other cancer in the family.   GYNECOLOGIC HISTORY:  She is GX P1, first pregnancy to term at age 9.  After her hysterectomy and salpingo oophorectomy she started estrogen replacement on which she continues.  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  Her husband, Annette Stable, present today, was a Production designer, theatre/television/film for [employer].  The patient has a son from an earlier marriage, Tinnie Gens, who lives in [place], [place], and works in IT for a hospital there.  Annette Stable has four children of his own all in this area.  There are a total of 14 grandchildren and 12 great-grandchildren.  The patient attends [employer], and is very active in that community.    ADVANCED DIRECTIVES:  HEALTH MAINTENANCE: History  Substance Use Topics  . Smoking status: Former Smoker    Quit date: 05/27/1992  . Smokeless tobacco: Never Used  . Alcohol Use: Yes     Comment: socially     Colonoscopy:  PAP:  Bone density:  Lipid  panel:  Allergies  Allergen Reactions  . Cephalexin     Current Outpatient Prescriptions  Medication Sig Dispense Refill  . Alendronate Sodium (FOSAMAX PO) Take 1 tablet by mouth.      . ARIPiprazole (ABILIFY) 10 MG tablet Take 5 mg by mouth daily.       Marland Kitchen azelastine (ASTELIN) 137 MCG/SPRAY nasal spray Place 1 spray into the nose 2 (two) times daily. Use in each nostril as directed      . Calcium Carbonate-Vit D-Min (CALTRATE PLUS PO) Take 1 tablet by mouth daily.      . cetirizine (ZYRTEC) 10 MG tablet Take 10 mg by mouth 2 (two) times daily.       . clonazePAM (KLONOPIN) 0.5 MG tablet Take 0.5 mg by mouth.      . ENABLEX 7.5 MG 24 hr tablet       . escitalopram (LEXAPRO) 20 MG tablet Take 20 mg by mouth daily.      Marland Kitchen esomeprazole (NEXIUM) 40 MG capsule Take 40 mg by mouth daily before breakfast.      . fish oil-omega-3 fatty acids 1000 MG capsule Take 2 g by mouth daily.      . fluticasone-salmeterol (ADVAIR HFA) 115-21 MCG/ACT inhaler Inhale 2 puffs into the lungs 2 (two) times daily.      . montelukast (SINGULAIR) 10 MG tablet Take 10 mg by mouth at bedtime.      . Multiple Vitamin (MULTIVITAMIN) tablet Take 1 tablet by mouth daily.      . pirbuterol (MAXAIR) 200 MCG/INH inhaler Inhale 2 puffs into the lungs 4 (four) times daily.      . pramipexole (MIRAPEX) 0.5 MG tablet       . zolpidem (AMBIEN) 10 MG tablet Take 10 mg by mouth at bedtime as needed.       No current facility-administered medications for this visit.    OBJECTIVE: Middle-aged white woman in no acute distress. Filed Vitals:   11/24/12 0908  BP: 112/76  Pulse: 80  Temp: 98 F (36.7 C)  Resp: 20     Body mass index is 37.47 kg/(m^2).    ECOG FS: 1 Filed Weights   11/24/12 0908  Weight: 198 lb 3.2 oz (89.903 kg)   Sclerae unicteric Oropharynx clear No cervical or supraclavicular adenopathy. No axillary adenopathy Lungs clear to auscultation bilaterally, no wheezes, no rales or rhonchi Heart regular rate  and rhythm, no murmurs appreciated Abdomen soft, obese, nontender to palpation, positive bowel sounds MSK kyphosis noted on  exam, but no focal spinal tenderness to palpation No peripheral edema Neuro: nonfocal, well oriented, positive affect Breasts: Deferred     LAB RESULTS: Lab Results  Component Value Date   WBC 9.1 11/24/2012   NEUTROABS 6.7* 11/24/2012   HGB 12.9 11/24/2012   HCT 38.9 11/24/2012   MCV 82.1 11/24/2012   PLT 252 11/24/2012      Chemistry      Component Value Date/Time   NA 138 11/24/2012 0847   NA 139 12/17/2011 1314   K 4.3 11/24/2012 0847   K 3.9 12/17/2011 1314   CL 105 06/15/2012 1257   CL 106 12/17/2011 1314   CO2 25 11/24/2012 0847   CO2 25 12/17/2011 1314   BUN 11.6 11/24/2012 0847   BUN 13 12/17/2011 1314   CREATININE 0.7 11/24/2012 0847   CREATININE 0.67 12/17/2011 1314      Component Value Date/Time   CALCIUM 8.5 11/24/2012 0847   CALCIUM 8.6 12/17/2011 1314   ALKPHOS 124 11/24/2012 0847   ALKPHOS 121* 12/17/2011 1314   AST 15 11/24/2012 0847   AST 19 12/17/2011 1314   ALT 15 11/24/2012 0847   ALT 20 12/17/2011 1314   BILITOT 0.21 11/24/2012 0847   BILITOT 0.3 12/17/2011 1314     Uric Acid  4.2  11/24/2012    3.9  06/15/2012    5.3  12/17/2011  LDH    Pending 11/24/2012    118  06/15/2012     88  12/17/2011    STUDIES: No results found.    ASSESSMENT: 67 y.o. [place] woman   (1)  with a history of nodular sclerosing Hodgkin's disease, clinically stage IV at presentation, treated with 6 cycles of AVD (the bleomycin was held secondary to concerns regarding possible pulmonary toxicity). Chemotherapy was completed in August of 2010.   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   PLAN: Erika [name] continues to do very well with regards to her lymphoma, with no clinical evidence of disease recurrence at this time. I have  recommended that she make an appointment with Dr. Cliffton Asters to review her elevated glucose and Erika [name] voices agreement. Otherwise, we will plan on repeating a final PET scan next year, after which she will see Dr. Darnelle Catalan for labs and physical exam. She is almost 5 years out, and will likely be allowed to "graduate" from followup next year.  Erika [name] voices understanding and agreement with this plan, and as always, knows to call with any problems prior to her next scheduled appointment.   Erika [name]    11/24/2012

## 2012-11-24 NOTE — Telephone Encounter (Signed)
sw pt gv appts for a lab on 09/03/13@ 9am, and ov on 09/09/13@ 11:30am. Pt is aware that i will mail a letter/avs as well.Erika [name]

## 2013-03-30 ENCOUNTER — Institutional Professional Consult (permissible substitution): Payer: Medicare Other | Admitting: Internal Medicine

## 2013-04-02 ENCOUNTER — Ambulatory Visit (INDEPENDENT_AMBULATORY_CARE_PROVIDER_SITE_OTHER): Payer: Medicare Other | Admitting: Internal Medicine

## 2013-04-02 ENCOUNTER — Encounter: Payer: Self-pay | Admitting: Internal Medicine

## 2013-04-02 ENCOUNTER — Ambulatory Visit (INDEPENDENT_AMBULATORY_CARE_PROVIDER_SITE_OTHER)
Admission: RE | Admit: 2013-04-02 | Discharge: 2013-04-02 | Disposition: A | Discharge status: Home / Self Care | Payer: Medicare Other | Source: Ambulatory Visit | Attending: Internal Medicine | Admitting: Internal Medicine

## 2013-04-02 VITALS — BP 122/78 | HR 73 | Temp 97.8°F | Ht 60.0 in | Wt 198.0 lb

## 2013-04-02 DIAGNOSIS — R0600 Dyspnea, unspecified: Secondary | ICD-10-CM

## 2013-04-02 DIAGNOSIS — R0609 Other forms of dyspnea: Secondary | ICD-10-CM

## 2013-04-02 DIAGNOSIS — J45909 Unspecified asthma, uncomplicated: Secondary | ICD-10-CM | POA: Insufficient documentation

## 2013-04-02 MED ORDER — MOMETASONE FURO-FORMOTEROL FUM 100-5 MCG/ACT IN AERO: INHALATION_SPRAY | RESPIRATORY_TRACT | Status: DC

## 2013-04-02 MED ORDER — FAMOTIDINE 20 MG PO TABS: ORAL_TABLET | ORAL | Status: AC

## 2013-04-02 NOTE — Assessment & Plan Note (Addendum)
Symptoms are markedly disproportionate to objective findings and not clear this is a lung problem but pt does appear to have difficult airway management issues. DDX of  difficult airways managment all start with A and  include Adherence, Ace Inhibitors, Acid Reflux, Active Sinus Disease, Alpha 1 Antitripsin deficiency, Anxiety masquerading as Airways dz,  ABPA,  allergy(esp in young), Aspiration (esp in elderly), Adverse effects of DPI,  Active smokers, plus two Bs  = Bronchiectasis and Beta blocker use..and one C= CHF   Adherence is always the initial "prime suspect" and is a multilayered concern that requires a "trust but verify" approach in every patient - starting with knowing how to use medications, especially inhalers, correctly, keeping up with refills and understanding the fundamental difference between maintenance and prns vs those medications only taken for a very short course and then stopped and not refilled.  - The proper method of use, as well as anticipated side effects, of a metered-dose inhaler are discussed and demonstrated to the patient. Improved effectiveness after extensive coaching during this visit to a level of approximately  75% so try dulera 100 2bid  ? Adverse effect of dpi, esp advair > try low dose dulera instead reasoning that if the problem is upper airway in nature but she still needs a medication that gets to the lower airway without irritating the upper one then hfa dulera /symbicort best choice  ? Acid (or non-acid) GERD > always difficult to exclude as up to 75% of pts in some series report no assoc GI/ Heartburn symptoms> rec max (24h)  acid suppression and diet restrictions/ reviewed and instructions given in writting   Regroup in 2 weeks

## 2013-04-02 NOTE — Progress Notes (Signed)
  Subjective:    Patient ID: Erika [name], female    DOB: Oct 09, 1945  MRN: [id number]  HPI  43 yowf quit smoking 1994  At about 89 with h/o sinus problems and asthma and better after quit but still needed daily medications and much worse with dx of HD 2010 and better p autologous BMT 2011 @ wt 120 to point where did daily therapy in late summer and winter  referred by Dr Laurann Montana and worse since Summer 2014.   04/02/2013 1st [facility] office visit/ Airon Sahni  Chief Complaint  Patient presents with  . Pulmonary Consult    Referred per Dr. Laurann Montana. Pt c/o DOE for the past 3 months. She states that she gets SOB with walking approx 1/4 of a mile and also when walking up stairs.    since summer of 2014 constant sense of pnds and sometimes has to sleep in reclliner > clear mucus Was on advair 250 and doubled to 500 no benefit. maxair helps a lot but rarely uses "I thought I was supposed to use advair instead"  No obvious day to day or daytime variabilty or assoc cp or chest tightness, subjective wheeze overt   hb symptoms. No unusual exp hx or h/o childhood pna/ asthma or knowledge of premature birth.  Sleeping ok without nocturnal  or early am exacerbation  of respiratory  c/o's or need for noct saba. Also denies any obvious fluctuation of symptoms with weather or environmental changes or other aggravating or alleviating factors except as outlined above   Current Medications, Allergies, Complete Past Medical History, Past Surgical History, Family History, and Social History were reviewed in [facility] record.         Review of Systems  Constitutional: Negative for fever, chills and unexpected weight change.  HENT: Positive for postnasal drip. Negative for congestion, dental problem, ear pain, nosebleeds, rhinorrhea, sinus pressure, sneezing, sore throat, trouble swallowing and voice change.   Eyes: Negative for visual disturbance.  Respiratory:  Positive for cough and shortness of breath. Negative for choking.   Cardiovascular: Positive for leg swelling. Negative for chest pain.  Gastrointestinal: Negative for vomiting, abdominal pain and diarrhea.  Genitourinary: Negative for difficulty urinating.  Musculoskeletal: Positive for arthralgias.  Skin: Negative for rash.  Neurological: Negative for tremors, syncope and headaches.  Hematological: Bruises/bleeds easily.       Objective:   Physical Exam  Hoarse amb wf with voice fatigue  Wt Readings from Last 3 Encounters:  04/02/13 198 lb (89.812 kg)  11/24/12 198 lb 3.2 oz (89.903 kg)  06/22/12 197 lb (89.359 kg)      HEENT: nl dentition, turbinates, and orophanx. Nl external ear canals without cough reflex   NECK :  without JVD/Nodes/TM/ nl carotid upstrokes bilaterally   LUNGS: no acc muscle use, clear to A and P bilaterally without cough on insp or exp maneuvers   CV:  RRR  no s3 or murmur or increase in P2, no edema   ABD:  soft and nontender with nl excursion in the supine position. No bruits or organomegaly, bowel sounds nl  MS:  warm without deformities, calf tenderness, cyanosis or clubbing  SKIN: warm and dry without lesions    NEURO:  alert, approp, no deficits    CXR  04/02/2013 :  No acute abnormalities.      Assessment & Plan:

## 2013-04-02 NOTE — Progress Notes (Signed)
Quick Note:  Advised pt of cxr results per MW. Pt verbalized understanding and has no further questions at this time ______ 

## 2013-04-02 NOTE — Patient Instructions (Addendum)
Nexium 40 mg Take 30-60 min before first meal of the day and Pepcid 20 mg one at bedtime   Dulera 100 Take 2 puffs first thing in am and then another 2 puffs about 12 hours later.   Work on inhaler technique:  relax and gently blow all the way out then take a nice smooth deep breath back in, triggering the inhaler at same time you start breathing in.  Hold for up to 5 seconds if you can.  Rinse and gargle with water when done     Only use your albuterol(maxair)  as a rescue medication to be used if you can't catch your breath by resting or doing a relaxed purse lip breathing pattern.  - The less you use it, the better it will work when you need it. - Ok to use up to every 4 hours if you must but call for immediate appointment if use goes up over your usual need - Don't leave home without it !!  (think of it like your spare tire for your car)   GERD (REFLUX)  is an extremely common cause of respiratory symptoms, many times with no significant heartburn at all.    It can be treated with medication, but also with lifestyle changes including avoidance of late meals, excessive alcohol, smoking cessation, and avoid fatty foods, chocolate, peppermint, colas, red wine, and acidic juices such as orange juice.  NO MINT OR MENTHOL PRODUCTS SO NO COUGH DROPS  USE SUGARLESS CANDY INSTEAD (jolley ranchers or Stover's)  NO OIL BASED VITAMINS - use powdered substitutes.  Please remember to go to the  x-ray department downstairs for your tests - we will call you with the results when they are available.     Please schedule a follow up office visit in 2 weeks, sooner if needed

## 2013-04-16 ENCOUNTER — Ambulatory Visit (INDEPENDENT_AMBULATORY_CARE_PROVIDER_SITE_OTHER): Payer: Medicare Other | Admitting: Internal Medicine

## 2013-04-16 ENCOUNTER — Encounter: Payer: Self-pay | Admitting: Internal Medicine

## 2013-04-16 VITALS — BP 130/80 | HR 70 | Temp 97.9°F | Ht 60.0 in | Wt 195.4 lb

## 2013-04-16 DIAGNOSIS — R0609 Other forms of dyspnea: Secondary | ICD-10-CM

## 2013-04-16 DIAGNOSIS — R0600 Dyspnea, unspecified: Secondary | ICD-10-CM

## 2013-04-16 MED ORDER — MOMETASONE FURO-FORMOTEROL FUM 100-5 MCG/ACT IN AERO: INHALATION_SPRAY | RESPIRATORY_TRACT | Status: DC

## 2013-04-16 NOTE — Patient Instructions (Addendum)
Work on inhaler technique:  relax and gently blow all the way out then take a nice smooth deep breath back in, triggering the inhaler at same time you start breathing in.  Hold for up to 5 seconds if you can.  Rinse and gargle with water when done  Try chlortrimeton 8 mg in evening / before bed to see if helps drainage and take zyrtec during the day only if needed for itching/ sneezing/ runny nose  Please schedule a follow up visit in 3 months but call sooner if needed with pfts

## 2013-04-16 NOTE — Progress Notes (Signed)
Subjective:    Patient ID: Erika [name], female    DOB: Dec 01, 1945  MRN: [id number]    Brief patient profile:  44 yowf quit smoking 1994  At about 89 with h/o sinus problems and asthma and better after quit but still needed daily medications and much worse with dx of HD 2010 and better p autologous BMT 2011 @ wt 120 to but still needed daily resp meds   referred by Dr Laurann Montana  With worse  Cough and sob since Summer 2014.   04/02/2013 1st [facility] office visit/ Erika [name]  Chief Complaint  Patient presents with  . Pulmonary Consult    Referred per Dr. Laurann Montana. Pt c/o DOE for the past 3 months. She states that she gets SOB with walking approx 1/4 of a mile and also when walking up stairs.   Since summer of 2014 constant sense of pnds and sometimes has to sleep in reclliner > clear mucus Was on advair 250 and doubled to 500 no benefit. maxair helps a lot but rarely uses "I thought I was supposed to use advair instead"    04/16/2013 f/u ov/Erika [name] re: cough/ sob Chief Complaint  Patient presents with  . Follow-up    Pt states that her breathing has improved since last visit. No new co's today. Has not had to use maxair since her last visit.  still has sensation of pnds when head hits the pillow x years x years on zyrtec - otherwise doing better to her satisfaction - able to lie flat in bed now.   No obvious day to day or daytime variabilty or assoc  cp or chest tightness, subjective wheeze overt sinus or hb symptoms. No unusual exp hx or h/o childhood pna/ asthma or knowledge of premature birth.  Sleeping ok without nocturnal  or early am exacerbation  of respiratory  c/o's or need for noct saba. Also denies any obvious fluctuation of symptoms with weather or environmental changes or other aggravating or alleviating factors except as outlined above   Current Medications, Allergies, Complete Past Medical History, Past Surgical History, Family History, and Social History were  reviewed in [employer] [facility] record.  ROS  The following are not active complaints unless bolded sore throat, dysphagia, dental problems, itching, sneezing,  nasal congestion or excess/ purulent secretions, ear ache,   fever, chills, sweats, unintended wt loss, pleuritic or exertional cp, hemoptysis,  orthopnea pnd or leg swelling, presyncope, palpitations, heartburn, abdominal pain, anorexia, nausea, vomiting, diarrhea  or change in bowel or urinary habits, change in stools or urine, dysuria,hematuria,  rash, arthralgias, visual complaints, headache, numbness weakness or ataxia or problems with walking or coordination,  change in mood/affect or memory.             .       Objective:   Physical Exam   amb wf no longer with hoarseness or voice fatigue   Wt Readings from Last 3 Encounters:  04/16/13 195 lb 6.4 oz (88.633 kg)  04/02/13 198 lb (89.812 kg)  11/24/12 198 lb 3.2 oz (89.903 kg)         HEENT: nl dentition, turbinates, and orophanx. Nl external ear canals without cough reflex   NECK :  without JVD/Nodes/TM/ nl carotid upstrokes bilaterally   LUNGS: no acc muscle use, clear to A and P bilaterally with min pseudowheeze  CV:  RRR  no s3 or murmur or increase in P2, no edema   ABD:  soft and nontender  with nl excursion in the supine position. No bruits or organomegaly, bowel sounds nl  MS:  warm without deformities, calf tenderness, cyanosis or clubbing  SKIN: warm and dry without lesions    NEURO:  alert, approp, no deficits    CXR  04/02/2013 :  No acute abnormalities.      Assessment & Plan:

## 2013-04-16 NOTE — Assessment & Plan Note (Signed)
DDX of  difficult airways managment all start with A and  include Adherence, Ace Inhibitors, Acid Reflux, Active Sinus Disease, Alpha 1 Antitripsin deficiency, Anxiety masquerading as Airways dz,  ABPA,  allergy(esp in young), Aspiration (esp in elderly), Adverse effects of DPI,  Active smokers, plus two Bs  = Bronchiectasis and Beta blocker use..and one C= CHF   Adherence is always the initial "prime suspect" and is a multilayered concern that requires a "trust but verify" approach in every patient - starting with knowing how to use medications, especially inhalers, correctly, keeping up with refills and understanding the fundamental difference between maintenance and prns vs those medications only taken for a very short course and then stopped and not refilled.  - The proper method of use, as well as anticipated side effects, of a metered-dose inhaler are discussed and demonstrated to the patient. Improved effectiveness after extensive coaching during this visit to a level of approximately  75% so continue dulera 100  2bid   ? Acid (or non-acid) GERD > always difficult to exclude as up to 75% of pts in some series report no assoc GI/ Heartburn symptoms> rec continue  max (24h)  acid suppression and diet restrictions/ reviewed and instructions given in writing   ? Allergy/ pnds > try change zyrtec to prn in am and use 1st gen h1 per guidelines for pnds at hs to see what difference if any this makes - consider allergy w/u next ov and in meantime continue singulair as well but low threshold to stop if better     F/u in 3 months with cxr

## 2013-08-24 ENCOUNTER — Telehealth: Payer: Self-pay | Admitting: Oncology

## 2013-08-24 NOTE — Telephone Encounter (Signed)
, °

## 2013-09-02 ENCOUNTER — Other Ambulatory Visit: Payer: Medicare Other | Admitting: Lab

## 2013-09-03 ENCOUNTER — Other Ambulatory Visit: Payer: Medicare Other

## 2013-09-03 ENCOUNTER — Encounter (HOSPITAL_COMMUNITY): Payer: Medicare Other

## 2013-09-09 ENCOUNTER — Ambulatory Visit: Payer: Medicare Other | Admitting: Oncology

## 2013-10-19 ENCOUNTER — Other Ambulatory Visit (HOSPITAL_BASED_OUTPATIENT_CLINIC_OR_DEPARTMENT_OTHER): Payer: Medicare Other

## 2013-10-19 ENCOUNTER — Encounter (HOSPITAL_COMMUNITY): Payer: Self-pay

## 2013-10-19 ENCOUNTER — Ambulatory Visit (HOSPITAL_COMMUNITY)
Admission: RE | Admit: 2013-10-19 | Discharge: 2013-10-19 | Disposition: A | Discharge status: Home / Self Care | Payer: Medicare Other | Source: Ambulatory Visit | Attending: Physician Assistant | Admitting: Physician Assistant

## 2013-10-19 DIAGNOSIS — C8110 Nodular sclerosis classical Hodgkin lymphoma, unspecified site: Secondary | ICD-10-CM

## 2013-10-19 DIAGNOSIS — C8190 Hodgkin lymphoma, unspecified, unspecified site: Secondary | ICD-10-CM | POA: Insufficient documentation

## 2013-10-19 DIAGNOSIS — C8119 Nodular sclerosis classical Hodgkin lymphoma, extranodal and solid organ sites: Secondary | ICD-10-CM

## 2013-10-19 DIAGNOSIS — J984 Other disorders of lung: Secondary | ICD-10-CM | POA: Insufficient documentation

## 2013-10-19 LAB — CBC WITH DIFFERENTIAL/PLATELET
BASO%: 0.5 % (ref 0.0–2.0)
BASOS ABS: 0.1 10*3/uL (ref 0.0–0.1)
EOS ABS: 0.3 10*3/uL (ref 0.0–0.5)
EOS%: 1.3 % (ref 0.0–7.0)
HEMATOCRIT: 40.8 % (ref 34.8–46.6)
HEMOGLOBIN: 12.8 g/dL (ref 11.6–15.9)
LYMPH#: 1.2 10*3/uL (ref 0.9–3.3)
LYMPH%: 6 % — AB (ref 14.0–49.7)
MCH: 26.4 pg (ref 25.1–34.0)
MCHC: 31.4 g/dL — ABNORMAL LOW (ref 31.5–36.0)
MCV: 84 fL (ref 79.5–101.0)
MONO#: 1.1 10*3/uL — AB (ref 0.1–0.9)
MONO%: 5.9 % (ref 0.0–14.0)
NEUT%: 86.3 % — AB (ref 38.4–76.8)
NEUTROS ABS: 16.7 10*3/uL — AB (ref 1.5–6.5)
PLATELETS: 264 10*3/uL (ref 145–400)
RBC: 4.86 10*6/uL (ref 3.70–5.45)
RDW: 17.1 % — ABNORMAL HIGH (ref 11.2–14.5)
WBC: 19.4 10*3/uL — AB (ref 3.9–10.3)

## 2013-10-19 LAB — GLUCOSE, CAPILLARY: GLUCOSE-CAPILLARY: 102 mg/dL — AB (ref 70–99)

## 2013-10-19 LAB — COMPREHENSIVE METABOLIC PANEL (CC13)
ALT: 24 U/L (ref 0–55)
AST: 19 U/L (ref 5–34)
Albumin: 3.6 g/dL (ref 3.5–5.0)
Alkaline Phosphatase: 119 U/L (ref 40–150)
Anion Gap: 10 mEq/L (ref 3–11)
BUN: 17.1 mg/dL (ref 7.0–26.0)
CO2: 24 mEq/L (ref 22–29)
Calcium: 9.2 mg/dL (ref 8.4–10.4)
Chloride: 106 mEq/L (ref 98–109)
Creatinine: 0.7 mg/dL (ref 0.6–1.1)
Glucose: 107 mg/dl (ref 70–140)
Potassium: 4.3 mEq/L (ref 3.5–5.1)
Sodium: 140 mEq/L (ref 136–145)
Total Bilirubin: 0.26 mg/dL (ref 0.20–1.20)
Total Protein: 7 g/dL (ref 6.4–8.3)

## 2013-10-19 LAB — LACTATE DEHYDROGENASE (CC13): LDH: 124 U/L — ABNORMAL LOW (ref 125–245)

## 2013-10-19 LAB — URIC ACID (CC13): Uric Acid, Serum: 4.4 mg/dl (ref 2.6–7.4)

## 2013-10-19 MED ORDER — FLUDEOXYGLUCOSE F - 18 (FDG) INJECTION
11.0000 | Freq: Once | INTRAVENOUS | Status: AC | PRN
  Administered 2013-10-19: 11 via INTRAVENOUS

## 2013-10-20 ENCOUNTER — Other Ambulatory Visit: Payer: Self-pay | Admitting: Oncology

## 2013-10-21 ENCOUNTER — Other Ambulatory Visit: Payer: Self-pay | Admitting: Oncology

## 2013-10-26 ENCOUNTER — Ambulatory Visit (HOSPITAL_BASED_OUTPATIENT_CLINIC_OR_DEPARTMENT_OTHER): Payer: Medicare Other | Admitting: Oncology

## 2013-10-26 ENCOUNTER — Ambulatory Visit (HOSPITAL_COMMUNITY)
Admission: RE | Admit: 2013-10-26 | Discharge: 2013-10-26 | Disposition: A | Discharge status: Home / Self Care | Payer: Medicare Other | Source: Ambulatory Visit | Attending: Oncology | Admitting: Oncology

## 2013-10-26 VITALS — BP 127/69 | HR 82 | Temp 98.0°F | Resp 18 | Ht 60.0 in | Wt 200.0 lb

## 2013-10-26 DIAGNOSIS — R0602 Shortness of breath: Secondary | ICD-10-CM | POA: Insufficient documentation

## 2013-10-26 DIAGNOSIS — C8119 Nodular sclerosis classical Hodgkin lymphoma, extranodal and solid organ sites: Secondary | ICD-10-CM

## 2013-10-26 DIAGNOSIS — C8110 Nodular sclerosis classical Hodgkin lymphoma, unspecified site: Secondary | ICD-10-CM

## 2013-10-26 DIAGNOSIS — Z23 Encounter for immunization: Secondary | ICD-10-CM

## 2013-10-26 DIAGNOSIS — Z8571 Personal history of Hodgkin lymphoma: Secondary | ICD-10-CM | POA: Insufficient documentation

## 2013-10-26 MED ORDER — PNEUMOCOCCAL VAC POLYVALENT 25 MCG/0.5ML IJ INJ
0.5000 mL | INJECTION | Freq: Once | INTRAMUSCULAR | Status: AC
  Administered 2013-10-26: 0.5 mL via INTRAMUSCULAR
  Filled 2013-10-26: qty 0.5

## 2013-10-26 MED ORDER — HAEMOPHILUS B POLYSAC CONJ VAC IM SOLR
0.5000 mL | Freq: Once | INTRAMUSCULAR | Status: AC
  Administered 2013-10-26: 0.5 mL via INTRAMUSCULAR
  Filled 2013-10-26: qty 0.5

## 2013-10-26 MED ORDER — MENINGOCOCCAL VAC A,C,Y,W-135 ~~LOC~~ INJ
0.5000 mL | INJECTION | Freq: Once | SUBCUTANEOUS | Status: AC
  Administered 2013-10-26: 1 mL via SUBCUTANEOUS
  Filled 2013-10-26: qty 0.5

## 2013-10-26 NOTE — Progress Notes (Signed)
ID: Erika [name]   DOB: [birth year]  MR#: [id number]  XNA#:[id number]  PCP: Particia Jasper White GYN: SU: OTHER MD: Letta Moynahan, Scott McDiarmid, Gaynelle Arabian, Ward Givens  HISTORY OF PRESENT ILLNESS: From the original 2010 intake note:  Erika [name] had been feeling tired for a year or two, that being more tired than would be normal she feels.  She has also lost approximately 60 pounds in the last two years.  She interpreted that as "a gift from God", and was just delighted, but then more recently, in addition to the weight loss and fatigue, she started having nausea and vomiting.  She was also having drenching night sweats, which she interprets, possibly accurately, as hot flashes, but possibly they could be due to the tumor.  In any case, she saw Dr. Dema Severin for this, and Dr. Dema Severin thought she really deserved admission for further evaluation.  This was done on June 13, 2008.  Studies that admission included an admission CBC - white cells 20.2, hemoglobin 11.6, and platelets 123,000.  Coags were normal, and CMP was normal except for an alkaline phosphatase of 215, and an ALT of 37.  Calcium was 9.0, albumin 2.4, creatinine 0.60.  LDH was 125, and the ionized calcium was normal at 1.2.  Anemia panel showed a reticulocyte count of 65, a B12 of 434, a folate of 9 and a ferritin of 261.  ESR was 102, CA-125, CEA and CA19 were all normal, as was urine immunofixation and serum protein electrophoresis and immunofixation.  Her total IgA was 637, which is elevated. Her total IgG was 1280, and her total IgM was 284, which is minimally elevated.  Multiple cultures including blood x two, bronchial washings for AFB and urine were negative, or in the case of urine, non-informative.    On June 17, 2008 she underwent bronchoscopy with the pathology (C10-127, C10-126, and C10-119) showing a few atypical cells, but basically being nondiagnostic.  During this admission the patient had a CT of the chest,  which showed scattered bilateral scarring and left lower lobe atelectasis, as well as some hilar and subcarinal adenopathy.  There was a 3 cm hepatic lobe lesion in the right hepatic lobe, and a second smaller lesion in the same area.  The spleen appeared normal.  There were multiple retroperitoneal surgical clips from her prior surgery for ovarian cancer, and multiple mildly enlarged retroperitoneal lymph nodes.  The pelvis was status post hysterectomy, but no significant adenopathy, and the largest nodal mass measures 4.9 x 2.8 cm in the subcarinal area.  There were also multiple abnormal axillary lymph nodes noted, and there were multiple bilateral sub-centimeter pulmonary nodules predominant in the upper lobes, the largest measuring 7 mm.    On June 19, 2008, the patient had an upper endoscopy by Dr. Michail Sermon, which showed mild gastritis, small hiatal hernia and a mildly tortuous esophagus, but no stricture or malignancy.  On July 01, 2008, the patient has ultrasound-guided biopsy of a right supraclavicular lymph node, which showed (S10-662 and C10-212) predominantly blood and fibrous adipose tissue.    Finally, on July 11, 2008, the patient underwent excision of a deep left axillary lymph node under Dr. 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  The flow cytometry showed no monoclonal B-cell population or abnormal T-cell phenotype, and the cells in question were CD30, CD15 positive, and negative for CD45, CD3 and other markers.  The patient was further staged with a PET scan obtained June 29, 2008.  This showed extensive hypermetabolic lymphadenopathy in the soft tissues of the neck, axilla, mediastinum, retroperitoneum and small bowel mesentery.  There was a hypermetabolic left upper lobe nodule measuring 7.3 mm with an SUV of 6.3. There was a large focus of intense involvement  in the thoracic spine, suggestive of bone involvement.  The right hepatic lobe 2.9 cm lesion had an SUV of 35.6.  There were at least two foci of mildly increased FDG uptake in the spleen as well.    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  her subsequent treatment is as detailed below.   INTERVAL HISTORY: [name] returns today for followup of her Hodgkin's lymphoma Accompanied by her husband [name].  From a Hodgkin's disease point of view she is doing "wonderful", and asked for problems she has she says "absolutely nothing".   REVIEW OF SYSTEMS: Nevaya does have significant lung problems, followed by Dr. Melvyn Novas. She is not currently on steroids. She is on antibiotics on and off. She admits to shortness of breath with walking particularly when walking up stairs. She sleeps on 2 pillows. She denies fever however, hemoptysis or pleurisy. She has stress urinary incontinence. She has chronic low back pain and significant osteoporosis. There have been no unexplained fatigue or unexplained weight loss and she has not noted any adenopathy. A detailed review of systems today was otherwise stable  PAST MEDICAL HISTORY: Past Medical History  Diagnosis Date  . hodgkins lymphoma dx'd 07/14/09 & 05/2009    chemo comp; bone marrow transplant  . Asthma   . OSA (obstructive sleep apnea)     not using CPAP- unable to tolerate  Depression.  She has a history of migraines, although these are rare.  She has a history of asthma, which is treated by Dr. Dema Severin, a history of tobacco abuse, the patient quitting in 1992, smoking about one-half pack per day for perhaps 30 years.  There is a history of ovarian cancer, and she underwent TAH/BSO in 1999.  I do not have those records.  She also had at least partial omentectomy by her account.  This was done under Dr. Lemont Fillers and Dr. Marti Sleigh.  The patient was told that she did not need any  adjuvant treatment, and indeed she has been disease free now for more than ten years.  She has a small hiatal hernia.  She has significant osteoarthritis involving the knees, which makes it difficult for her to ambulate.  She has a history of hypercholesterolemia, and she has a history of stasis dermatitis involving the left ankle.    PAST SURGICAL HISTORY: Past Surgical History  Procedure Laterality Date  . Cholecystectomy  1966  . Vesicovaginal fistula closure w/ tah  1999  . Bone marrow transplant  2011   FAMILY HISTORY Family History  Problem Relation Age of Onset  . Heart disease Father   The patient's father died at the age of 53 from congestive heart failure.  The patient's mother died at the age of 47 with Alzheimer's disease.  The patient has one brother and one sister, but there is no other cancer in the family.   GYNECOLOGIC HISTORY: She is GX P1, first pregnancy to term at age 25.  After her hysterectomy and salpingo oophorectomy she started estrogen replacement on which she continues.  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  Her husband, [name], present today, was a Freight forwarder for [employer].  The patient has a son from an earlier marriage, Dellis Filbert, who lives in [place], [place], and works in IT for a hospital there.  [name] has four children of his own all in this area.  There are a total of 14 grandchildren and 12 great-grandchildren.  The patient attends [employer], and is very active in that community.    ADVANCED DIRECTIVES:  HEALTH MAINTENANCE: History  Substance Use Topics  . Smoking status: Former Smoker -- 0.25 packs/day for 10 years    Types: Cigarettes    Quit date: 05/27/1992  . Smokeless tobacco: Never Used  . Alcohol Use: Yes     Comment: socially     Colonoscopy:  PAP:  Bone density:  Lipid panel:  Allergies  Allergen Reactions  . Cephalexin   . Sudafed [Pseudoephedrine Hcl] Itching and Swelling     Current Outpatient Prescriptions  Medication Sig Dispense Refill  . ARIPiprazole (ABILIFY) 10 MG tablet Take 10 mg by mouth daily.       . Calcium Carbonate-Vit D-Min (CALTRATE PLUS PO) Take 1 tablet by mouth daily.      . cetirizine (ZYRTEC) 10 MG tablet Take 10 mg by mouth daily.       Marland Kitchen escitalopram (LEXAPRO) 20 MG tablet Take 20 mg by mouth daily.      Marland Kitchen esomeprazole (NEXIUM) 40 MG capsule Take 40 mg by mouth daily before breakfast.      . eszopiclone (LUNESTA) 2 MG TABS tablet Take 2 mg by mouth at bedtime as needed for sleep. Take immediately before bedtime      . famotidine (PEPCID) 20 MG tablet One at bedtime  30 tablet  2  . fluticasone (FLONASE) 50 MCG/ACT nasal spray Place 1 spray into both nostrils daily.      . metFORMIN (GLUCOPHAGE) 500 MG tablet Take 500 mg by mouth daily.      . mirabegron ER (MYRBETRIQ) 50 MG TB24 tablet Take 50 mg by mouth daily.      . mometasone-formoterol (DULERA) 100-5 MCG/ACT AERO Take 2 puffs first thing in am and then another 2 puffs about 12 hours later.  1 Inhaler  11  . montelukast (SINGULAIR) 10 MG tablet Take 10 mg by mouth at bedtime.      . pirbuterol (MAXAIR) 200 MCG/INH inhaler Inhale 2 puffs into the lungs 4 (four) times daily as needed for wheezing or shortness of breath.      . pramipexole (MIRAPEX) 0.5 MG tablet Take 1 mg by mouth at bedtime.       Marland Kitchen UNABLE TO FIND Med Name: 962 (nyst/taccr,aqua) cream as needed       No current facility-administered medications for this visit.    OBJECTIVE: Middle-aged white woman who appears stated age  68 Vitals:   10/26/13 1128  BP: 127/69  Pulse: 82  Temp: 98 F (36.7 C)  Resp: 18     Body mass index is 39.06 kg/(m^2).    ECOG FS: 1 Filed Weights   10/26/13 1128  Weight: 200 lb (90.719 kg)   Sclerae unicteric, pupils round and reactive  Oropharynx clearAnd moist, no thrush or other lesions  No cervical or supraclavicular adenopathy. No axillary adenopathy Lungs no wheezes, no  rales or rhonchi Heart regular rate and rhythm, no murmurs appreciated Abdomen soft, obese, nontender to palpation, positive bowel sounds; no inguinal adenopathy MSK  significant kyphosis kyphosis but  no focal spinal tenderness  No peripheral edema Neuro: nonfocal, well oriented, positive affect Breasts: Deferred  LAB RESULTS: Lab Results  Component Value Date   WBC 19.4* 10/19/2013   NEUTROABS 16.7* 10/19/2013   HGB 12.8 10/19/2013   HCT 40.8 10/19/2013   MCV 84.0 10/19/2013   PLT 264 10/19/2013      Chemistry      Component Value Date/Time   NA 140 10/19/2013 0917   NA 139 12/17/2011 1314   K 4.3 10/19/2013 0917   K 3.9 12/17/2011 1314   CL 105 06/15/2012 1257   CL 106 12/17/2011 1314   CO2 24 10/19/2013 0917   CO2 25 12/17/2011 1314   BUN 17.1 10/19/2013 0917   BUN 13 12/17/2011 1314   CREATININE 0.7 10/19/2013 0917   CREATININE 0.67 12/17/2011 1314      Component Value Date/Time   CALCIUM 9.2 10/19/2013 0917   CALCIUM 8.6 12/17/2011 1314   ALKPHOS 119 10/19/2013 0917   ALKPHOS 121* 12/17/2011 1314   AST 19 10/19/2013 0917   AST 19 12/17/2011 1314   ALT 24 10/19/2013 0917   ALT 20 12/17/2011 1314   BILITOT 0.26 10/19/2013 0917   BILITOT 0.3 12/17/2011 1314    Results for BETHANN, QUALLEY (MRN [id number]) as of 10/31/2013 10:50  Ref. Range 06/04/2011 09:36 12/17/2011 13:14 06/15/2012 12:57 11/24/2012 08:47 10/19/2013 09:17  LDH Latest Range: 125-245 U/L 90 (L) 88 (L) 118 (L) 104 (L) 124 (L)    STUDIES: Dg Chest 2 View  10/26/2013   CLINICAL DATA:  Shortness of Breath ; history of Hodgkin's bowel  EXAM: CHEST  2 VIEW  COMPARISON:  April 02, 2013 chest radiograph and PET-CT Oct 19, 2013  FINDINGS: The hazy opacity in the right lung seen on recent CT is not appreciable on this study. On this study, the lungs appear clear. The heart size and pulmonary vascularity are normal. There are surgical clips in the left axilla region. Patient has undergone kyphoplasty at multiple levels. Currently there is  compression of the T12 vertebral body as well and is vertebral bodies which of undergone kyphoplasty procedures.  IMPRESSION: No edema or consolidation apparent.   Electronically Signed   By: Lowella Grip M.D.   On: 10/26/2013 14:08   Nm Pet Image Restag (ps) Skull Base To Thigh  10/19/2013   CLINICAL DATA:  Subsequent treatment strategy for Hodgkin's lymphoma.  EXAM: NUCLEAR MEDICINE PET SKULL BASE TO THIGH  TECHNIQUE: 11.0 mCi F-18 FDG was injected intravenously. Full-ring PET imaging was performed from the skull base to thigh after the radiotracer. CT data was obtained and used for attenuation correction and anatomic localization.  FASTING BLOOD GLUCOSE:  Value: 102 mg/dl  COMPARISON:  PET-CT 06/04/2011  FINDINGS: NECK  No hypermetabolic lymph nodes in the neck.  CHEST  There is ill-defined hypermetabolic ground-glass opacities in the right upper lobe (image 20, series 8) and the right lower lobe (image 34, series 8). Moderate metabolic activity with the right upper lobe lesion (SUV max 4.4). Potential hypermetabolic right infrahilar lymph node. No hypermetabolic mediastinal lymph nodes.  There small hypermetabolic right axillary lymph node which measures only 8 mm (image 53, series 4) but does have associated metabolic activity with SUV max 1.7. Similar small hypermetabolic left axillary lymph nodes.  ABDOMEN/PELVIS  There is a discrete focus of hypermetabolic activity within the spleen measuring approximately 2 cm with SUV max equal 9.5.  No hypermetabolic retroperitoneal lymph nodes. No abnormal activity in liver. There is  mild metabolic activity associated with small inguinal lymph nodes (SUV max equaled 2.5).  SKELETON  No focal hypermetabolic activity to suggest skeletal metastasis. There is metabolic activity associated with the vertebroplasty augmentation within the lumbar spine which is felt to be inflammatory.  IMPRESSION: 1. Evidence of lymphoma recurrence with hypermetabolic lesion within the  spleen. 2. Metabolic activity associated with ground-glass opacities in the right upper lobe and right lower lobe potential right hilar hypermetabolic noted. Differential includes pulmonary infection, drug reaction, or less likely lymphoma involvement of the lung parenchyma. . 3. Small hypermetabolic axillary and inguinal lymph nodes are indeterminate but concerning for lymphoma recurrence.   Electronically Signed   By: Suzy Bouchard M.D.   On: 10/19/2013 11:50    ASSESSMENT: 68 y.o. [place] woman   (1)  with a history of nodular sclerosing Hodgkin's disease, clinically stage IV at presentation, treated with 6 cycles of AVD (the bleomycin was held secondary to concerns regarding possible pulmonary toxicity). Chemotherapy was completed August of 2010.   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  (3) hypermetabolic lesion in the spleen noted by PET scan 10/19/2013  (a) "triple vaccine" given 10/26/2013  PLAN: We spent approximately 45 minutes going over Dannon situation today.  she is 4 years out from her transplant. She has some nonspecific findings in her lungs, and some mild adenopathy, which I would say is generally nonspecific, particularly given her history of multiple chronic pulmonary problems. The lesion in the spleen cannot be dismissed  so easily. It strongly suggests either recurrence of her non-Hodgkin's lymphoma or possibly a new malignancy.  She understands biopsy of the spleen is not generally done. It may be that she will require splenectomy at some point and for that reason we went ahead and gave her the usual vaccines (against Haemophilus, pneumococcus and meningococcus) today.  I discussed her situation with Dr. Minna Antis at [facility]. His feeling is that if she is not a candidate for allogenic transplantation (which given her other medical  problems she clearly is not) treatment would be palliative, and if she is currently asymptomatic then there is really nothing to palliate at this point. I went over all this with Adylene and her husband and we decided to obtain an MRI of the abdomen early July, which will allow [place] to more exactly measure the splenic lesion and to followup without exposing Fredericka to excessive radiation from repeated scans. We are also obtaining a chest x-ray for baseline.  If she develops systemic disease where it would be amenable to biopsy, then we can proceed to that. If there is apparent progression in the spleen alone over the next several months, we can consider splenectomy.  Dalis has a good understanding of the overall plan. She agrees with it. She knows the goal of treatment in her case is control. She will call with any problems that may develop before her next visit here.   Virgie Dad Magrinat    10/26/2013

## 2013-10-29 ENCOUNTER — Telehealth: Payer: Self-pay | Admitting: Oncology

## 2013-10-29 NOTE — Telephone Encounter (Signed)
, °

## 2013-11-01 ENCOUNTER — Telehealth: Payer: Self-pay | Admitting: *Deleted

## 2013-11-01 NOTE — Telephone Encounter (Signed)
Returning voicemail call to patient, she is not home so I spoke with spouse. Patient questions if she needs another CXR in with 11/2013 visit. According to plan on 10/26/13, the CXR was to be a baseline for future, she does not need one in July, but we are going to further evaluate the spleen for any changes. Instructed spouse to have patient call for any questions.

## 2013-11-03 ENCOUNTER — Encounter: Payer: Self-pay | Admitting: Internal Medicine

## 2013-11-03 ENCOUNTER — Ambulatory Visit (INDEPENDENT_AMBULATORY_CARE_PROVIDER_SITE_OTHER): Payer: Medicare Other | Admitting: Internal Medicine

## 2013-11-03 VITALS — BP 106/60 | HR 80 | Temp 98.1°F | Ht <= 58 in | Wt 200.8 lb

## 2013-11-03 DIAGNOSIS — J45909 Unspecified asthma, uncomplicated: Secondary | ICD-10-CM

## 2013-11-03 MED ORDER — MOMETASONE FURO-FORMOTEROL FUM 100-5 MCG/ACT IN AERO: INHALATION_SPRAY | RESPIRATORY_TRACT | Status: DC

## 2013-11-03 NOTE — Progress Notes (Signed)
Subjective:    Patient ID: Erika [name], female    DOB: Aug 19, 1945  MRN: [id number]    Brief patient profile:  45 yowf quit smoking 1994  At about 89 with h/o sinus problems and asthma and better after quit but still needed daily medications and much worse with dx of HD 2010 and better p autologous BMT 2011 @ wt 120 to but still needed daily resp meds   referred by Dr Harlan Stains  With worse  Cough and sob since Summer 2014.   04/02/2013 1st [facility] office visit/ [facility]  Chief Complaint  Patient presents with  . Pulmonary Consult    Referred per Dr. Harlan Stains. Pt c/o DOE for the past 3 months. She states that she gets SOB with walking approx 1/4 of a mile and also when walking up stairs.   Since summer of 2014 constant sense of pnds and sometimes has to sleep in reclliner > clear mucus Was on advair 250 and doubled to 500 no benefit. maxair helps a lot but rarely uses "I thought I was supposed to use advair instead" rec Nexium 40 mg Take 30-60 min before first meal of the day and Pepcid 20 mg one at bedtime  Dulera 100 Take 2 puffs first thing in am and then another 2 puffs about 12 hours later.  Work on inhaler technique:   Only use your albuterol(maxair)       04/16/2013 f/u ov/[facility]ert re: cough/ sob Chief Complaint  Patient presents with  . Follow-up    Pt states that her breathing has improved since last visit. No new co's today. Has not had to use maxair since her last visit.  still has sensation of pnds when head hits the pillow x years x years on zyrtec - otherwise doing better to her satisfaction - able to lie flat in bed now. rec Work on inhaler technique:    Try chlortrimeton 8 mg in evening / before bed to see if helps drainage and take zyrtec during the day only if needed for itching/ sneezing/ runny nose Please schedule a follow up visit in 3 months but call sooner if needed with pfts     11/03/2013 f/u ov/[facility]ert re: cough/ sob  Chief Complaint  Patient  presents with  . Follow-up    Pt states that her breathing is some worse since last visit. She has been needing rescue inhaler approx 3 x per wk.   on advair 500 p dulera ran out  And not doing as well on this. Rescue inhaler is variably effective. No noct symptoms   No obvious day to day or daytime variabilty or assoc  cp or chest tightness, subjective wheeze overt sinus or hb symptoms. No unusual exp hx or h/o childhood pna/ asthma or knowledge of premature birth.  Sleeping ok without nocturnal  or early am exacerbation  of respiratory  c/o's or need for noct saba. Also denies any obvious fluctuation of symptoms with weather or environmental changes or other aggravating or alleviating factors except as outlined above   Current Medications, Allergies, Complete Past Medical History, Past Surgical History, Family History, and Social History were reviewed in [employer] record.  ROS  The following are not active complaints unless bolded sore throat, dysphagia, dental problems, itching, sneezing,  nasal congestion or excess/ purulent secretions, ear ache,   fever, chills, sweats, unintended wt loss, pleuritic or exertional cp, hemoptysis,  orthopnea pnd or leg swelling, presyncope, palpitations, heartburn, abdominal pain,  anorexia, nausea, vomiting, diarrhea  or change in bowel or urinary habits, change in stools or urine, dysuria,hematuria,  rash, arthralgias, visual complaints, headache, numbness weakness or ataxia or problems with walking or coordination,  change in mood/affect or memory.             .       Objective:   Physical Exam   amb wf no longer with hoarseness or voice fatigue   11/03/2013        201  Wt Readings from Last 3 Encounters:  04/16/13 195 lb 6.4 oz (88.633 kg)  04/02/13 198 lb (89.812 kg)  11/24/12 198 lb 3.2 oz (89.903 kg)         HEENT: nl dentition, turbinates, and orophanx. Nl external ear canals without cough reflex   NECK :   without JVD/Nodes/TM/ nl carotid upstrokes bilaterally   LUNGS: no acc muscle use, clear to A and P bilaterally with min pseudowheeze  CV:  RRR  no s3 or murmur or increase in P2, no edema   ABD:  soft and nontender with nl excursion in the supine position. No bruits or organomegaly, bowel sounds nl  MS:  warm without deformities, calf tenderness, cyanosis or clubbing  SKIN: warm and dry without lesions    NEURO:  alert, approp, no deficits    CXR  04/02/2013 :  No acute abnormalities.      Assessment & Plan:

## 2013-11-03 NOTE — Patient Instructions (Signed)
Restart dulera 100 Take 2 puffs first thing in am and then another 2 puffs about 12 hours later.   Work on inhaler technique:  relax and gently blow all the way out then take a nice smooth deep breath back in, triggering the inhaler at same time you start breathing in.  Hold for up to 5 seconds if you can.  Rinse and gargle with water when done  Continue pepcid 20 mg at bedtime and chloretrimeton 8 mg at bedtime stop advair.  Please schedule a follow up office visit in 4 weeks, sooner if needed with pfts

## 2013-11-05 NOTE — Assessment & Plan Note (Addendum)
DDX of  difficult airways managment all start with A and  include Adherence, Ace Inhibitors, Acid Reflux, Active Sinus Disease, Alpha 1 Antitripsin deficiency, Anxiety masquerading as Airways dz,  ABPA,  allergy(esp in young), Aspiration (esp in elderly), Adverse effects of DPI,  Active smokers, plus two Bs  = Bronchiectasis and Beta blocker use..and one C= CHF  Adherence is always the initial "prime suspect" and is a multilayered concern that requires a "trust but verify" approach in every patient - starting with knowing how to use medications, especially inhalers, correctly, keeping up with refills and understanding the fundamental difference between maintenance and prns vs those medications only taken for a very short course and then stopped and not refilled.  The proper method of use, as well as anticipated side effects, of a metered-dose inhaler are discussed and demonstrated to the patient. Improved effectiveness after extensive coaching during this visit to a level of approximately  75% so resume dulera 100 2bid pending pfts  ? Acid (or non-acid) GERD > always difficult to exclude as up to 75% of pts in some series report no assoc GI/ Heartburn symptoms> rec continue max (24h)  acid suppression    ? Allergies > continue singulair though not really clear this has helped and after returns for pfts low threshold to d/c   ? Adverse effects of dpi> not doing as well on dpi advair at 500 as was doing on dulera 100 so resume the latter and d/c the former    Each maintenance medication was reviewed in detail including most importantly the difference between maintenance and as needed and under what circumstances the prns are to be used.  Please see instructions for details which were reviewed in writing and the patient given a copy.

## 2013-11-07 ENCOUNTER — Other Ambulatory Visit: Payer: Self-pay | Admitting: Oncology

## 2013-11-16 ENCOUNTER — Other Ambulatory Visit: Payer: Self-pay | Admitting: Physician Assistant

## 2013-11-16 DIAGNOSIS — C8110 Nodular sclerosis classical Hodgkin lymphoma, unspecified site: Secondary | ICD-10-CM

## 2013-11-25 ENCOUNTER — Ambulatory Visit (HOSPITAL_COMMUNITY): Admission: RE | Admit: 2013-11-25 | Payer: Medicare Other | Source: Ambulatory Visit

## 2013-11-25 ENCOUNTER — Other Ambulatory Visit (HOSPITAL_BASED_OUTPATIENT_CLINIC_OR_DEPARTMENT_OTHER): Payer: Medicare Other

## 2013-11-25 ENCOUNTER — Other Ambulatory Visit: Payer: Self-pay | Admitting: Oncology

## 2013-11-25 ENCOUNTER — Ambulatory Visit (HOSPITAL_COMMUNITY)
Admission: RE | Admit: 2013-11-25 | Discharge: 2013-11-25 | Disposition: A | Discharge status: Home / Self Care | Payer: Medicare Other | Source: Ambulatory Visit | Attending: Oncology | Admitting: Oncology

## 2013-11-25 DIAGNOSIS — C8110 Nodular sclerosis classical Hodgkin lymphoma, unspecified site: Secondary | ICD-10-CM

## 2013-11-25 DIAGNOSIS — K838 Other specified diseases of biliary tract: Secondary | ICD-10-CM | POA: Insufficient documentation

## 2013-11-25 DIAGNOSIS — I517 Cardiomegaly: Secondary | ICD-10-CM | POA: Insufficient documentation

## 2013-11-25 DIAGNOSIS — K7689 Other specified diseases of liver: Secondary | ICD-10-CM | POA: Insufficient documentation

## 2013-11-25 DIAGNOSIS — R109 Unspecified abdominal pain: Secondary | ICD-10-CM | POA: Insufficient documentation

## 2013-11-25 DIAGNOSIS — C8119 Nodular sclerosis classical Hodgkin lymphoma, extranodal and solid organ sites: Secondary | ICD-10-CM

## 2013-11-25 DIAGNOSIS — C8190 Hodgkin lymphoma, unspecified, unspecified site: Secondary | ICD-10-CM | POA: Insufficient documentation

## 2013-11-25 DIAGNOSIS — K449 Diaphragmatic hernia without obstruction or gangrene: Secondary | ICD-10-CM | POA: Insufficient documentation

## 2013-11-25 LAB — COMPREHENSIVE METABOLIC PANEL (CC13)
ALT: 22 U/L (ref 0–55)
AST: 16 U/L (ref 5–34)
Albumin: 3.6 g/dL (ref 3.5–5.0)
Alkaline Phosphatase: 122 U/L (ref 40–150)
Anion Gap: 10 mEq/L (ref 3–11)
BILIRUBIN TOTAL: 0.24 mg/dL (ref 0.20–1.20)
BUN: 14.4 mg/dL (ref 7.0–26.0)
CO2: 25 mEq/L (ref 22–29)
Calcium: 9.4 mg/dL (ref 8.4–10.4)
Chloride: 105 mEq/L (ref 98–109)
Creatinine: 0.8 mg/dL (ref 0.6–1.1)
Glucose: 103 mg/dl (ref 70–140)
Potassium: 4.4 mEq/L (ref 3.5–5.1)
Sodium: 140 mEq/L (ref 136–145)
Total Protein: 7.1 g/dL (ref 6.4–8.3)

## 2013-11-25 LAB — LACTATE DEHYDROGENASE (CC13): LDH: 107 U/L — ABNORMAL LOW (ref 125–245)

## 2013-11-25 LAB — CBC WITH DIFFERENTIAL/PLATELET
BASO%: 0.9 % (ref 0.0–2.0)
BASOS ABS: 0.1 10*3/uL (ref 0.0–0.1)
EOS ABS: 0.3 10*3/uL (ref 0.0–0.5)
EOS%: 3.5 % (ref 0.0–7.0)
HEMATOCRIT: 40.5 % (ref 34.8–46.6)
HEMOGLOBIN: 12.9 g/dL (ref 11.6–15.9)
LYMPH%: 16.3 % (ref 14.0–49.7)
MCH: 26.5 pg (ref 25.1–34.0)
MCHC: 31.8 g/dL (ref 31.5–36.0)
MCV: 83.2 fL (ref 79.5–101.0)
MONO#: 0.8 10*3/uL (ref 0.1–0.9)
MONO%: 8.5 % (ref 0.0–14.0)
NEUT%: 70.8 % (ref 38.4–76.8)
NEUTROS ABS: 7 10*3/uL — AB (ref 1.5–6.5)
Platelets: 258 10*3/uL (ref 145–400)
RBC: 4.86 10*6/uL (ref 3.70–5.45)
RDW: 16.3 % — AB (ref 11.2–14.5)
WBC: 9.9 10*3/uL (ref 3.9–10.3)
lymph#: 1.6 10*3/uL (ref 0.9–3.3)

## 2013-11-25 LAB — URIC ACID (CC13): URIC ACID, SERUM: 4.5 mg/dL (ref 2.6–7.4)

## 2013-11-25 MED ORDER — GADOBENATE DIMEGLUMINE 529 MG/ML IV SOLN
19.0000 mL | Freq: Once | INTRAVENOUS | Status: AC | PRN
  Administered 2013-11-25: 19 mL via INTRAVENOUS

## 2013-12-03 ENCOUNTER — Telehealth: Payer: Self-pay | Admitting: Oncology

## 2013-12-03 ENCOUNTER — Ambulatory Visit (HOSPITAL_BASED_OUTPATIENT_CLINIC_OR_DEPARTMENT_OTHER): Payer: Medicare Other | Admitting: Oncology

## 2013-12-03 VITALS — BP 137/83 | HR 88 | Temp 97.9°F | Resp 18 | Ht <= 58 in | Wt 200.4 lb

## 2013-12-03 DIAGNOSIS — D7389 Other diseases of spleen: Secondary | ICD-10-CM

## 2013-12-03 DIAGNOSIS — R928 Other abnormal and inconclusive findings on diagnostic imaging of breast: Secondary | ICD-10-CM

## 2013-12-03 DIAGNOSIS — J4550 Severe persistent asthma, uncomplicated: Secondary | ICD-10-CM

## 2013-12-03 DIAGNOSIS — C8119 Nodular sclerosis classical Hodgkin lymphoma, extranodal and solid organ sites: Secondary | ICD-10-CM

## 2013-12-03 DIAGNOSIS — C8110 Nodular sclerosis classical Hodgkin lymphoma, unspecified site: Secondary | ICD-10-CM

## 2013-12-03 DIAGNOSIS — F341 Dysthymic disorder: Secondary | ICD-10-CM

## 2013-12-03 DIAGNOSIS — R5381 Other malaise: Secondary | ICD-10-CM

## 2013-12-03 DIAGNOSIS — R5383 Other fatigue: Secondary | ICD-10-CM

## 2013-12-03 NOTE — Telephone Encounter (Signed)
per pof to sch pt appt-adv pt that MRI, [place] will be sch by [facility] & they will call her. Pt understood-gave pt copy of sch-per GM ist pof wrong for this pt use 2nd pof

## 2013-12-03 NOTE — Progress Notes (Signed)
ID: Erika [name]   DOB: [birth year]  MR#: [id number]  JKK#:[id number]  PCP: Erika [name] GYN: SU: OTHER MD: Erika [name], Erika [name], Erika [name], Erika [name], Erika [name]  CHIEF COMPLAINT: Possibly recurrent Hodgkin's disease  CURRENT THERAPY: Observation  HISTORY OF PRESENT ILLNESS: From the original 2010 intake note:  Erika [name] had been feeling tired for a year or two, that being more tired than would be normal she feels.  She has also lost approximately 60 pounds in the last two years.  She interpreted that as "a gift from God", and was just delighted, but then more recently, in addition to the weight loss and fatigue, she started having nausea and vomiting.  She was also having drenching night sweats, which she interprets, possibly accurately, as hot flashes, but possibly they could be due to the tumor.  In any case, she saw Erika [name] for this, and Erika [name] thought she really deserved admission for further evaluation.  This was done on June 13, 2008.  Studies that admission included an admission CBC - [name] cells 20.2, hemoglobin 11.6, and platelets 123,000.  Coags were normal, and CMP was normal except for an alkaline phosphatase of 215, and an ALT of 37.  Calcium was 9.0, albumin 2.4, creatinine 0.60.  LDH was 125, and the ionized calcium was normal at 1.2.  Anemia panel showed a reticulocyte count of 65, a B12 of 434, a folate of 9 and a ferritin of 261.  ESR was 102, CA-125, CEA and CA19 were all normal, as was urine immunofixation and serum protein electrophoresis and immunofixation.  Her total IgA was 637, which is elevated. Her total IgG was 1280, and her total IgM was 284, which is minimally elevated.  Multiple cultures including blood x two, bronchial washings for AFB and urine were negative, or in the case of urine, non-informative.    On June 17, 2008 she underwent bronchoscopy with the pathology (C10-127, C10-126, and C10-119) showing a few  atypical cells, but basically being nondiagnostic.  During this admission the patient had a CT of the chest, which showed scattered bilateral scarring and left lower lobe atelectasis, as well as some hilar and subcarinal adenopathy.  There was a 3 cm hepatic lobe lesion in the right hepatic lobe, and a second smaller lesion in the same area.  The spleen appeared normal.  There were multiple retroperitoneal surgical clips from her prior surgery for ovarian cancer, and multiple mildly enlarged retroperitoneal lymph nodes.  The pelvis was status post hysterectomy, but no significant adenopathy, and the largest nodal mass measures 4.9 x 2.8 cm in the subcarinal area.  There were also multiple abnormal axillary lymph nodes noted, and there were multiple bilateral sub-centimeter pulmonary nodules predominant in the upper lobes, the largest measuring 7 mm.    On June 19, 2008, the patient had an upper endoscopy by Erika [name], which showed mild gastritis, small hiatal hernia and a mildly tortuous esophagus, but no stricture or malignancy.  On July 01, 2008, the patient has ultrasound-guided biopsy of a right supraclavicular lymph node, which showed (S10-662 and C10-212) predominantly blood and fibrous adipose tissue.    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  The flow cytometry showed no monoclonal B-cell population or abnormal T-cell phenotype, and the cells in question were  CD30, CD15 positive, and negative for CD45, CD3 and other markers.    The patient was further staged with a PET scan obtained June 29, 2008.  This showed extensive hypermetabolic lymphadenopathy in the soft tissues of the neck, axilla, mediastinum, retroperitoneum and small bowel mesentery.  There was a  hypermetabolic left upper lobe nodule measuring 7.3 mm with an SUV of 6.3. There was a large focus of intense involvement in the thoracic spine, suggestive of bone involvement.  The right hepatic lobe 2.9 cm lesion had an SUV of 35.6.  There were at least two foci of mildly increased FDG uptake in the spleen as well.    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   INTERVAL HISTORY: Erika [name] returns today for followup of her Hodgkin's lymphoma accompanied by her husband Erika [name] and by their daughter. Since her last visit here she had bilateral mammography at [facility] 11/05/2013. This showed "a benign intramammary node in both breasts. There are also benign lymph nodes in the right breast." Ultrasound of both axillae on the same day showed a 1 cm mass in the left axillary tail, possibly a deep lymph node, "but difficult to characterize". MRI of the abdomen 11/25/2013 shows no splenomegaly. It confirms a 1.9 cm superior splenic lesion and there are possibly smaller foci of hypoenhancement throughout the remainder of the spleen. There was no abdominal adenopathy.  REVIEW OF SYSTEMS: Erika [name] is having some night sweats which may be occasionally "drenching". There has not been any weight loss and no change in her functional status, although she is chronically fatigued. She thinks she could walk 15 minutes although her husband is doubtful. She sleeps poorly. She continues to have significant pulmonary problems and she is followed closely by Erika [name]. She denies a significant cough denies fevers rash or bleeding problems. She has some arthritis in her back and feels anxious and depressed. She feels forgetful. Otherwise a detailed review of systems was noncontributory  PAST MEDICAL HISTORY: Past Medical History  Diagnosis Date  . hodgkins lymphoma dx'd 07/14/09 & 05/2009    chemo comp; bone  marrow transplant  . Asthma   . OSA (obstructive sleep apnea)     not using CPAP- unable to tolerate  Depression.  She has a history of migraines, although these are rare.  She has a history of asthma, which is treated by Erika [name], a history of tobacco abuse, the patient quitting in 1992, smoking about one-half pack per day for perhaps 30 years.  There is a history of ovarian cancer, and she underwent TAH/BSO in 1999.  I do not have those records.  She also had at least partial omentectomy by her account.  This was done under Dr. Lemont Fillers and Dr. Marti Sleigh.  The patient was told that she did not need any adjuvant treatment, and indeed she has been disease free now for more than ten years.  She has a small hiatal hernia.  She has significant osteoarthritis involving the knees, which makes it difficult for her to ambulate.  She has a history of hypercholesterolemia, and she has a history of stasis dermatitis involving the left ankle.    PAST SURGICAL HISTORY: Past Surgical History  Procedure Laterality Date  . Cholecystectomy  1966  . Vesicovaginal fistula closure w/ tah  1999  . Bone marrow transplant  2011   FAMILY HISTORY Family History  Problem  Relation Age of Onset  . Heart disease Father   The patient's father died at the age of 75 from congestive heart failure.  The patient's mother died at the age of 64 with Alzheimer's disease.  The patient has one brother and one sister, but there is no other cancer in the family.   GYNECOLOGIC HISTORY: She is GX P1, first pregnancy to term at age 13.  After her hysterectomy and salpingo oophorectomy she started estrogen replacement on which she continues.  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  Her husband, Erika [name], present today, was a Freight forwarder for [employer].  The patient has a son from an earlier marriage, Dellis Filbert, who lives in [place], [place], and works in IT for a hospital there.  Erika [name] has four children of  his own all in this area.  There are a total of 14 grandchildren and 12 great-grandchildren.  The patient attends [employer], and is very active in that community.    ADVANCED DIRECTIVES:  HEALTH MAINTENANCE: History  Substance Use Topics  . Smoking status: Former Smoker -- 0.25 packs/day for 10 years    Types: Cigarettes    Quit date: 05/27/1992  . Smokeless tobacco: Never Used  . Alcohol Use: Yes     Comment: socially     Colonoscopy:  PAP:  Bone density:  Lipid panel:  Allergies  Allergen Reactions  . Cephalexin   . Sudafed [Pseudoephedrine Hcl] Itching and Swelling    Current Outpatient Prescriptions  Medication Sig Dispense Refill  . ARIPiprazole (ABILIFY) 5 MG tablet Take 5 mg by mouth daily.      . Calcium Carbonate-Vit D-Min (CALTRATE PLUS PO) Take 1 tablet by mouth daily.      . cetirizine (ZYRTEC) 10 MG tablet Take 10 mg by mouth daily.       Marland Kitchen escitalopram (LEXAPRO) 20 MG tablet Take 20 mg by mouth daily.      Marland Kitchen esomeprazole (NEXIUM) 40 MG capsule Take 40 mg by mouth at bedtime.       . famotidine (PEPCID) 20 MG tablet One at bedtime  30 tablet  2  . fluticasone (FLONASE) 50 MCG/ACT nasal spray Place 1 spray into both nostrils daily.      . metFORMIN (GLUCOPHAGE) 500 MG tablet Take 500 mg by mouth daily.      . mirabegron ER (MYRBETRIQ) 50 MG TB24 tablet Take 50 mg by mouth daily.      . mometasone-formoterol (DULERA) 100-5 MCG/ACT AERO Take 2 puffs first thing in am and then another 2 puffs about 12 hours later.  1 Inhaler  11  . montelukast (SINGULAIR) 10 MG tablet Take 10 mg by mouth at bedtime.      . pirbuterol (MAXAIR) 200 MCG/INH inhaler Inhale 2 puffs into the lungs 4 (four) times daily as needed for wheezing or shortness of breath.      . pramipexole (MIRAPEX) 0.5 MG tablet Take 1 mg by mouth at bedtime.       Marland Kitchen UNABLE TO FIND Med Name: 161 (nyst/taccr,aqua) cream as needed       No current facility-administered medications for this  visit.    OBJECTIVE: Middle-aged [name] woman who appears stated age  68 Vitals:   12/03/13 1129  BP: 137/83  Pulse: 88  Temp: 97.9 F (36.6 C)  Resp: 18     Body mass index is 41.9 kg/(m^2).    ECOG FS: 1 Filed Weights   12/03/13  1129  Weight: 200 lb 6.4 oz (90.901 kg)   Sclerae unicteric, EOMs intact  Oropharynx clear and moist No cervical or supraclavicular adenopathy. No axillary or inguinal adenopathy Lungs no rales or rhonchi, poor excursion bilaterally Heart regular rate and rhythm  Abdomen soft, obese, nontender to palpation, positive bowel sounds MSK significant kyphosis kyphosis but no focal spinal tenderness  No upper extremity lymphedema Neuro: nonfocal, well oriented, positive affect Breasts: Deferred  LAB RESULTS: Lab Results  Component Value Date   WBC 9.9 11/25/2013   NEUTROABS 7.0* 11/25/2013   HGB 12.9 11/25/2013   HCT 40.5 11/25/2013   MCV 83.2 11/25/2013   PLT 258 11/25/2013      Chemistry      Component Value Date/Time   NA 140 11/25/2013 0857   NA 139 12/17/2011 1314   K 4.4 11/25/2013 0857   K 3.9 12/17/2011 1314   CL 105 06/15/2012 1257   CL 106 12/17/2011 1314   CO2 25 11/25/2013 0857   CO2 25 12/17/2011 1314   BUN 14.4 11/25/2013 0857   BUN 13 12/17/2011 1314   CREATININE 0.8 11/25/2013 0857   CREATININE 0.67 12/17/2011 1314      Component Value Date/Time   CALCIUM 9.4 11/25/2013 0857   CALCIUM 8.6 12/17/2011 1314   ALKPHOS 122 11/25/2013 0857   ALKPHOS 121* 12/17/2011 1314   AST 16 11/25/2013 0857   AST 19 12/17/2011 1314   ALT 22 11/25/2013 0857   ALT 20 12/17/2011 1314   BILITOT 0.24 11/25/2013 0857   BILITOT 0.3 12/17/2011 1314      STUDIES: Mr Abdomen W Wo Contrast  11/25/2013   CLINICAL DATA:  History of Hodgkin's lymphoma. Splenic lesion on recent PET. Abdominal pain for 6 months.  EXAM: MRI ABDOMEN WITHOUT AND WITH CONTRAST  TECHNIQUE: Multiplanar multisequence MR imaging of the abdomen was performed both before and after the administration of intravenous  contrast.  CONTRAST:  61m MULTIHANCE GADOBENATE DIMEGLUMINE 529 MG/ML IV SOLN  COMPARISON:  10/19/2013 PET. 06/04/2011 PET. Most recent contrast enhanced abdominal CT of 09/21/2010.  FINDINGS: Mild cardiomegaly. A small hiatal hernia. Moderate hepatic steatosis. No suspicious liver lesion. No evidence of splenomegaly. 1.9 cm anterior superior splenic lesion. This is minimally T2 hypo intense on image 20/series 4. Precontrast T1 isointense and post contrast hypoenhancing. Example image 23/series 13002. Possible smaller foci of hypoenhancement throughout the remainder of the spleen. Example image 36/series 1302. No perisplenic adenopathy.  Normal stomach and pancreas, without pancreatic ductal dilatation. Prior cholecystectomy. Mild to moderate intrahepatic biliary ductal dilatation. The left intrahepatic duct measures 11 mm versus 8 mm on 09/21/2010. The common duct measures 1.3 cm on image 33/series 4. At the same level on 09/21/2010, this measured 1.3 cm as well. No obstructive stone or mass.  Normal adrenal glands and kidneys. Prior retroperitoneal node dissection. No abdominal adenopathy or ascites. Suboptimally imaged thoracolumbar vertebroplasty and bilateral sacroplasty. Normal abdominal bowel loops.  IMPRESSION: 1. Dominant splenic lesion which has indeterminate MRI characteristics. Given its hypermetabolism at PET, remains suspicious for lymphoma recurrence. Cannot exclude smaller foci of lymphomatous involvement enhancement more inferiorly within the spleen. 2. No evidence of extrasplenic lymphoma within the abdomen. 3. Chronic biliary ductal dilatation after cholecystectomy. Favor within normal variation, given chronicity. 4. Moderate hepatic steatosis.   Electronically Signed   By: KAbigail MiyamotoM.D.   On: 11/25/2013 13:35   ASSESSMENT: 68y.o. Erika [name] woman   (1)  with a history of nodular sclerosing Hodgkin's disease, clinically stage IV  at presentation, treated with 6 cycles of AVD (the  bleomycin was held secondary to concerns regarding possible pulmonary toxicity). Chemotherapy was completed August of 2010.   (2)  She had biopsy confirmed recurrence December of 2010, treated with 2 cycles of ICE followed by BEAM conditioning at [facility], followed by autologous stem cell rescue April of 2011.  (3) hypermetabolic lesion in the spleen noted by PET scan 10/19/2013  (a) "triple vaccine" given 10/26/2013  PLAN: We spent approximately 45 minutes going over [place] situation today. She could well be having recurrence of her Hodgkin's disease. On the other hand she could have a different lymphoma developing in the spleen. Finally there could be a nonmalignant explanation of her spleen findings. The lymph nodes in her breasts appear benign by ultrasound, but will bear watching. They might prove to be the easiest to biopsy and therefore possibly give [place] a definitive diagnosis at some point.  As previously discussed, and as she understands well, if she does have recurrent Hodgkin's disease treatment would be palliative. In the absence of significant symptoms to palliate, continuing close followup is appropriate.  We are going to repeat an MRI of the liver and ultrasound and both axillae late December. If there has been a significant change in the axillary lymph nodes we will try to obtain a core biopsy of one of them. She will see me again in January. Erika [name] has a good understanding of this plan and agrees with it. She knows to call for any problems that may develop before her next visit here.  MAGRINAT,GUSTAV C    12/05/2013

## 2013-12-06 NOTE — Addendum Note (Signed)
Addended by: Amelia Jo I on: 12/06/2013 04:29 PM   Modules accepted: Orders, Medications

## 2013-12-10 ENCOUNTER — Ambulatory Visit (INDEPENDENT_AMBULATORY_CARE_PROVIDER_SITE_OTHER): Payer: Medicare Other | Admitting: Internal Medicine

## 2013-12-10 ENCOUNTER — Ambulatory Visit: Payer: Medicare Other | Admitting: Internal Medicine

## 2013-12-10 DIAGNOSIS — J45909 Unspecified asthma, uncomplicated: Secondary | ICD-10-CM

## 2013-12-10 DIAGNOSIS — J4540 Moderate persistent asthma, uncomplicated: Secondary | ICD-10-CM

## 2013-12-10 NOTE — Progress Notes (Signed)
PFT done today. Tywanna Seifer,CMA  

## 2013-12-12 ENCOUNTER — Encounter: Payer: Self-pay | Admitting: Internal Medicine

## 2013-12-12 LAB — PULMONARY FUNCTION TEST
DL/VA % pred: 123 %
DL/VA: 4.83 ml/min/mmHg/L
DLCO UNC % PRED: 80 %
DLCO unc: 13.03 ml/min/mmHg
FEF 25-75 POST: 1.74 L/s
FEF 25-75 PRE: 1.51 L/s
FEF2575-%Change-Post: 15 %
FEF2575-%PRED-POST: 108 %
FEF2575-%Pred-Pre: 93 %
FEV1-%Change-Post: 4 %
FEV1-%Pred-Post: 78 %
FEV1-%Pred-Pre: 75 %
FEV1-POST: 1.37 L
FEV1-Pre: 1.32 L
FEV1FVC-%Change-Post: 2 %
FEV1FVC-%Pred-Pre: 112 %
FEV6-%CHANGE-POST: 2 %
FEV6-%PRED-PRE: 69 %
FEV6-%Pred-Post: 70 %
FEV6-POST: 1.57 L
FEV6-Pre: 1.54 L
FEV6FVC-%PRED-POST: 105 %
FEV6FVC-%Pred-Pre: 105 %
FVC-%CHANGE-POST: 2 %
FVC-%Pred-Post: 67 %
FVC-%Pred-Pre: 66 %
FVC-POST: 1.57 L
FVC-PRE: 1.54 L
POST FEV6/FVC RATIO: 100 %
PRE FEV1/FVC RATIO: 85 %
Post FEV1/FVC ratio: 87 %
Pre FEV6/FVC Ratio: 100 %
RV % pred: 59 %
RV: 1.12 L
TLC % pred: 67 %
TLC: 2.8 L

## 2013-12-13 ENCOUNTER — Encounter: Payer: Self-pay | Admitting: Internal Medicine

## 2013-12-13 ENCOUNTER — Ambulatory Visit (INDEPENDENT_AMBULATORY_CARE_PROVIDER_SITE_OTHER): Payer: Medicare Other | Admitting: Internal Medicine

## 2013-12-13 VITALS — BP 124/84 | HR 77 | Temp 97.9°F | Ht <= 58 in | Wt 202.0 lb

## 2013-12-13 DIAGNOSIS — J45909 Unspecified asthma, uncomplicated: Secondary | ICD-10-CM

## 2013-12-13 DIAGNOSIS — J4520 Mild intermittent asthma, uncomplicated: Secondary | ICD-10-CM

## 2013-12-13 DIAGNOSIS — J310 Chronic rhinitis: Secondary | ICD-10-CM

## 2013-12-13 MED ORDER — ESOMEPRAZOLE MAGNESIUM 40 MG PO CPDR: DELAYED_RELEASE_CAPSULE | ORAL | Status: AC

## 2013-12-13 NOTE — Assessment & Plan Note (Signed)
-  11/03/2013 p extensive coaching HFA effectiveness =      75%  - PFTs 12/10/13  No airflow obst including fef 25-75%  p dulera 100  X 2 puffs same am   All goals of chronic asthma control met including optimal function and elimination of symptoms with minimal need for rescue therapy.  Contingencies discussed in full including contacting this office immediately if not controlling the symptoms using the rule of two's.

## 2013-12-13 NOTE — Progress Notes (Signed)
Quick Note:  Spoke with pt and notified of results per Dr. Wert. Pt verbalized understanding and denied any questions.  ______ 

## 2013-12-13 NOTE — Progress Notes (Signed)
Subjective:    Patient ID: Erika [name], female    DOB: 04/22/1946  MRN: [id number]    Brief patient profile:  73 yowf quit smoking 1994  At about 89 with h/o sinus problems and asthma and better after quit but still needed daily medications and much worse with dx of HD 2010 and better p autologous BMT 2011 @ wt 120 to but still needed daily resp meds   referred by Dr Harlan Stains  With worse  Cough and sob since Summer 2014.    History of Present Illness  04/02/2013 1st [facility] office visit/ Shanessa Hodak  Chief Complaint  Patient presents with  . Pulmonary Consult    Referred per Dr. Harlan Stains. Pt c/o DOE for the past 3 months. She states that she gets SOB with walking approx 1/4 of a mile and also when walking up stairs.   Since summer of 2014 constant sense of pnds and sometimes has to sleep in reclliner > clear mucus Was on advair 250 and doubled to 500 no benefit. maxair helps a lot but rarely uses "I thought I was supposed to use advair instead" rec Nexium 40 mg Take 30-60 min before first meal of the day and Pepcid 20 mg one at bedtime  Dulera 100 Take 2 puffs first thing in am and then another 2 puffs about 12 hours later.  Work on inhaler technique:   Only use your albuterol(maxair)       04/16/2013 f/u ov/Zadia Uhde re: cough/ sob Chief Complaint  Patient presents with  . Follow-up    Pt states that her breathing has improved since last visit. No new co's today. Has not had to use maxair since her last visit.  still has sensation of pnds when head hits the pillow x years x years on zyrtec - otherwise doing better to her satisfaction - able to lie flat in bed now. rec Work on inhaler technique:    Try chlortrimeton 8 mg in evening / before bed to see if helps drainage and take zyrtec during the day only if needed for itching/ sneezing/ runny nose Please schedule a follow up visit in 3 months but call sooner if needed with pfts     11/03/2013 f/u ov/Cash Duce re: cough/  sob  Chief Complaint  Patient presents with  . Follow-up    Pt states that her breathing is some worse since last visit. She has been needing rescue inhaler approx 3 x per wk.   on advair 500 p dulera ran out  And not doing as well on this. Rescue inhaler is variably effective. No noct symptoms rec Restart dulera 100 Take 2 puffs first thing in am and then another 2 puffs about 12 hours later.  Work on inhaler technique:      12/13/2013 f/u ov/Fernando Torry re: asthma/ obesity  Chief Complaint  Patient presents with  . Follow-up    Pt states that her breathing has improved back to her normal baseline. No new co's today. Has not needed rescue inhaler in the past wk.   sensation of drainage at bedtime but also during the day - not using h1 or nexiuem as rec, no excess mucus No need for saba at all Doe x 10 m walking flat indoors      No obvious day to day or daytime variabilty or assoc  cp or chest tightness, subjective wheeze overt sinus or hb symptoms. No unusual exp hx or h/o childhood pna/ asthma or knowledge of premature  birth.  Sleeping ok without nocturnal  or early am exacerbation  of respiratory  c/o's or need for noct saba. Also denies any obvious fluctuation of symptoms with weather or environmental changes or other aggravating or alleviating factors except as outlined above   Current Medications, Allergies, Complete Past Medical History, Past Surgical History, Family History, and Social History were reviewed in [employer] record.  ROS  The following are not active complaints unless bolded sore throat, dysphagia, dental problems, itching, sneezing,  nasal congestion or excess/ purulent secretions, ear ache,   fever, chills, sweats, unintended wt loss, pleuritic or exertional cp, hemoptysis,  orthopnea pnd or leg swelling, presyncope, palpitations, heartburn, abdominal pain, anorexia, nausea, vomiting, diarrhea  or change in bowel or urinary habits, change in stools  or urine, dysuria,hematuria,  rash, arthralgias, visual complaints, headache, numbness weakness or ataxia or problems with walking or coordination,  change in mood/affect or memory.             .       Objective:   Physical Exam   amb wf no longer with hoarseness or voice fatigue   11/03/2013        201 >    12/13/2013  202  Wt Readings from Last 3 Encounters:  04/16/13 195 lb 6.4 oz (88.633 kg)  04/02/13 198 lb (89.812 kg)  11/24/12 198 lb 3.2 oz (89.903 kg)         HEENT: nl dentition, turbinates, and orophanx. Nl external ear canals without cough reflex   NECK :  without JVD/Nodes/TM/ nl carotid upstrokes bilaterally   LUNGS: no acc muscle use, clear to A and P bilaterally with  No wheeze at all   CV:  RRR  no s3 or murmur or increase in P2, no edema   ABD:  soft and nontender with nl excursion in the supine position. No bruits or organomegaly, bowel sounds nl  MS:  warm without deformities, calf tenderness, cyanosis or clubbing  SKIN: warm and dry without lesions    NEURO:  alert, approp, no deficits    CXR  04/02/2013 :  No acute abnormalities.      Assessment & Plan:   Outpatient Encounter Prescriptions as of 12/13/2013  Medication Sig  . ARIPiprazole (ABILIFY) 5 MG tablet Take 5 mg by mouth daily.  . Calcium Carbonate-Vit D-Min (CALTRATE PLUS PO) Take 1 tablet by mouth daily.  Marland Kitchen escitalopram (LEXAPRO) 20 MG tablet Take 20 mg by mouth daily.  Marland Kitchen esomeprazole (NEXIUM) 40 MG capsule Take 30-60 min before first meal of the day  . famotidine (PEPCID) 20 MG tablet One at bedtime  . fluticasone (FLONASE) 50 MCG/ACT nasal spray Place 1 spray into both nostrils daily.  . metFORMIN (GLUCOPHAGE) 500 MG tablet Take 500 mg by mouth daily.  . mirabegron ER (MYRBETRIQ) 50 MG TB24 tablet Take 50 mg by mouth daily.  . mometasone-formoterol (DULERA) 100-5 MCG/ACT AERO Take 2 puffs first thing in am and then another 2 puffs about 12 hours later.  . montelukast  (SINGULAIR) 10 MG tablet Take 10 mg by mouth at bedtime.  . pirbuterol (MAXAIR) 200 MCG/INH inhaler Inhale 2 puffs into the lungs 4 (four) times daily as needed for wheezing or shortness of breath.  . pramipexole (MIRAPEX) 0.5 MG tablet Take 1 mg by mouth at bedtime.   Marland Kitchen UNABLE TO FIND Med Name: 109 (nyst/taccr,aqua) cream as needed  . [DISCONTINUED] esomeprazole (NEXIUM) 40 MG capsule Take 40 mg by mouth at  bedtime.

## 2013-12-13 NOTE — Assessment & Plan Note (Signed)
Very difficult to tease out gerd from pnds> try max gerd /add h1 prn per guidelines and f/u prn

## 2013-12-13 NOTE — Assessment & Plan Note (Signed)
PFT's 12/10/13 mod restriction with disproportionate reduction in ERV c/w body habitus   Calorie balance issues reviewed.

## 2013-12-13 NOTE — Patient Instructions (Signed)
Weight control is simply a matter of calorie balance which needs to be tilted in your favor by eating less and exercising more.  To get the most out of exercise, you need to be continuously aware that you are short of breath, but never out of breath, for 30 minutes daily. As you improve, it will actually be easier for you to do the same amount of exercise  in  30 minutes so always push to the level where you are short of breath.  If this does not result in gradual weight reduction then I strongly recommend you see a nutritionist with a food diary x 2 weeks so that we can work out a negative calorie balance which is universally effective in steady weight loss programs.  Think of your calorie balance like you do your bank account where in this case you want the balance to go down so you must take in less calories than you burn up.  It's just that simple:  Hard to do, but easy to understand.  Good luck!   Change nexium 40  Mg Take 30-60 min before first meal of the day   For drainage take chlortrimeton (chlorpheniramine) 4 mg every 4 hours available over the counter (may cause drowsiness)   Ok to try dulera 100 2 puffs each am if doing as well - if not increase back to 2 every 12 hours   If you are satisfied with your treatment plan,  let your doctor know and he/she can either refill your medications or you can return here when your prescription runs out.     If in any way you are not 100% satisfied,  please tell us.  If 100% better, tell your friends!  Pulmonary follow up is as needed

## 2014-02-02 ENCOUNTER — Other Ambulatory Visit: Payer: Self-pay | Admitting: Family Medicine

## 2014-02-11 ENCOUNTER — Encounter: Payer: Self-pay | Admitting: *Deleted

## 2014-02-22 ENCOUNTER — Encounter (HOSPITAL_COMMUNITY): Payer: Self-pay

## 2014-02-22 ENCOUNTER — Other Ambulatory Visit (HOSPITAL_COMMUNITY): Payer: Self-pay | Admitting: Family Medicine

## 2014-02-22 ENCOUNTER — Ambulatory Visit (HOSPITAL_COMMUNITY)
Admission: RE | Admit: 2014-02-22 | Discharge: 2014-02-22 | Disposition: A | Discharge status: Home / Self Care | Payer: Medicare Other | Source: Ambulatory Visit | Attending: Family Medicine | Admitting: Family Medicine

## 2014-02-22 DIAGNOSIS — M810 Age-related osteoporosis without current pathological fracture: Secondary | ICD-10-CM | POA: Insufficient documentation

## 2014-02-22 MED ORDER — SODIUM CHLORIDE 0.9 % IV SOLN
Freq: Once | INTRAVENOUS | Status: AC
  Administered 2014-02-22: 08:00:00 via INTRAVENOUS

## 2014-02-22 MED ORDER — ZOLEDRONIC ACID 5 MG/100ML IV SOLN
5.0000 mg | Freq: Once | INTRAVENOUS | Status: AC
  Administered 2014-02-22: 5 mg via INTRAVENOUS
  Filled 2014-02-22: qty 100

## 2014-02-22 NOTE — Progress Notes (Signed)
Patient tolerated Reclast w/o immediate reaction

## 2014-02-22 NOTE — Discharge Instructions (Signed)

## 2014-03-14 ENCOUNTER — Other Ambulatory Visit: Payer: Medicare Other

## 2014-03-16 ENCOUNTER — Other Ambulatory Visit: Payer: Self-pay | Admitting: *Deleted

## 2014-03-16 DIAGNOSIS — C8110 Nodular sclerosis classical Hodgkin lymphoma, unspecified site: Secondary | ICD-10-CM

## 2014-03-18 ENCOUNTER — Other Ambulatory Visit (HOSPITAL_BASED_OUTPATIENT_CLINIC_OR_DEPARTMENT_OTHER): Payer: Medicare Other

## 2014-03-18 DIAGNOSIS — C8110 Nodular sclerosis classical Hodgkin lymphoma, unspecified site: Secondary | ICD-10-CM

## 2014-03-18 LAB — CBC WITH DIFFERENTIAL/PLATELET
BASO%: 0.3 % (ref 0.0–2.0)
Basophils Absolute: 0 10*3/uL (ref 0.0–0.1)
EOS ABS: 0.2 10*3/uL (ref 0.0–0.5)
EOS%: 3 % (ref 0.0–7.0)
HCT: 38 % (ref 34.8–46.6)
HGB: 12.3 g/dL (ref 11.6–15.9)
LYMPH%: 22 % (ref 14.0–49.7)
MCH: 26.3 pg (ref 25.1–34.0)
MCHC: 32.4 g/dL (ref 31.5–36.0)
MCV: 81.4 fL (ref 79.5–101.0)
MONO#: 0.7 10*3/uL (ref 0.1–0.9)
MONO%: 8.8 % (ref 0.0–14.0)
NEUT%: 65.9 % (ref 38.4–76.8)
NEUTROS ABS: 4.9 10*3/uL (ref 1.5–6.5)
Platelets: 257 10*3/uL (ref 145–400)
RBC: 4.67 10*6/uL (ref 3.70–5.45)
RDW: 16.4 % — AB (ref 11.2–14.5)
WBC: 7.4 10*3/uL (ref 3.9–10.3)
lymph#: 1.6 10*3/uL (ref 0.9–3.3)

## 2014-03-18 LAB — COMPREHENSIVE METABOLIC PANEL (CC13)
ALBUMIN: 3.3 g/dL — AB (ref 3.5–5.0)
ALT: 24 U/L (ref 0–55)
AST: 17 U/L (ref 5–34)
Alkaline Phosphatase: 113 U/L (ref 40–150)
Anion Gap: 9 mEq/L (ref 3–11)
BUN: 9.8 mg/dL (ref 7.0–26.0)
CO2: 23 mEq/L (ref 22–29)
Calcium: 9.4 mg/dL (ref 8.4–10.4)
Chloride: 107 mEq/L (ref 98–109)
Creatinine: 0.8 mg/dL (ref 0.6–1.1)
GLUCOSE: 111 mg/dL (ref 70–140)
POTASSIUM: 3.8 meq/L (ref 3.5–5.1)
SODIUM: 139 meq/L (ref 136–145)
Total Bilirubin: 0.34 mg/dL (ref 0.20–1.20)
Total Protein: 6.7 g/dL (ref 6.4–8.3)

## 2014-03-18 LAB — URIC ACID (CC13): Uric Acid, Serum: 4.9 mg/dl (ref 2.6–7.4)

## 2014-03-18 LAB — LACTATE DEHYDROGENASE (CC13): LDH: 141 U/L (ref 125–245)

## 2014-03-21 ENCOUNTER — Encounter: Payer: Self-pay | Admitting: Family Medicine

## 2014-03-21 ENCOUNTER — Ambulatory Visit (INDEPENDENT_AMBULATORY_CARE_PROVIDER_SITE_OTHER): Payer: Medicare Other | Admitting: Family Medicine

## 2014-03-21 DIAGNOSIS — R7309 Other abnormal glucose: Secondary | ICD-10-CM

## 2014-03-21 NOTE — Progress Notes (Signed)
Medical Nutrition Therapy:  Appt start time: 1000 end time:  1100.  Assessment:  Primary concerns today: Blood sugar control.  Sahvanna was referred by Dr. Carita Pian for pre-DM and obesity.  She has Hodgkins lymphoma, and chronic asthma.  About 8 weeks ago, she started exercising with a personal trainer, and she made a few dietary changes at that time (eliminated ice cream and diet Cokes).  She is frustrated that she has not seen any change in weight, however.  She lost a good amt of weight doing Weight Watchers years ago, but she has gained it back plus more.  She does little cooking; only her husband and self at home now.  Admits that they seldom eat veg's b/c her husband doesn't like many and she just doesn't bother to prepare them.  Seldom drinks water (again mentioned that her husband "hates" water); main beverage now is diet lemonade.    Learning Readiness: Ready  Barriers to learning/adherence to lifestyle change: confusion as to "what to eat, when to eat, how to eat"...  Usual eating pattern includes 2 meals (usually skips breakfast) and 2 snacks per day. Frequent foods and beverages include diet lemonade, Crystal Light, water.  Avoided foods include diet Cokes, ice cream (recently stopped both), most veg's (b/c "lazy").  She avoids orange juice per Dr. Gustavus Bryant recommendation b/c of her medication Dulera. Usual physical activity includes an exercise program at [facility], "[employer] by Dedra Skeens":  30 min personal trainer 5 X wk.    24-hr recall: (Up at 6 AM) B (6:30 AM)-   2 c coffee, 1.25 tsp Splenda, 1 tbsp half+half  Snk (11 AM)-   1 donut L (12:30 PM)-  16 oz diet lemonade Snk (1 PM)-  2 fried eggs, potato patty, 1 English muffin, more diet lemonade D (6 PM)-  8 oz fish fillet, 1 c rice, diet lemonade, 1 slc pumpkin pie, Coolwhip Snk ( PM)-  Diet lemonade Typical day? No. Usually does not have breakfast (or at most, an Ensure); lemonade at 12:30 after exercise; 4-6 Saltines w/ 1 oz cheese  ~3/4 PM; dinner is usually meat and starch.    Progress Towards Goal(s):  In progress.   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  As evidenced by no veg's yesterday, and usual intake of veg's only a couple times a week.    Intervention:  Nutrition education.  Handouts given during visit include:  AVS  Goals Sheet  Demonstrated degree of understanding via:  Teach Back   Monitoring/Evaluation:  Dietary intake, exercise, and body weight in 4 week(s).

## 2014-03-21 NOTE — Patient Instructions (Addendum)
-   What you did when you lost weight before:  - Much attn to measuring and portion control; MINDFULNESS about food choices and preparation.   - Advance planning.  - A lot of diet drinks.   - A lot of vegetables.    Goals: 1. Eat at least 3 REAL meals and 1-2 snacks per day.  Aim for no more than 5 hours between eating.  Eat breakfast within one hour of getting up.   A real meal includes a protein, a starch, and a vegetable and/or fruit.     - If you eat bananas, limit to 1/2 at a time.  (Other fruits that are high-glycemic [raise blood sugars well] include grapes, oranges, watermelon, and many       tropical fruits.)   Most other fruits will be lower in calories and will not raise blood sugar a lot.     - Breakfast could be cottage cheese or yogurt with berries OR high-fiber cereals (at least 5 g fiber per serving) w/ 1% milk.   - Lunch might be a sandwich, celery sticks, carrots, sliced tomatoes OR tomato bisque with added frozen veg's and some crackers.    - Dinner might be hamburger on bun with broccoli OR any bean soup with 5 or 6 crackers, salad OR any meat/fish with baked potato, any veg's.       (Studies indicate that people are more satisfied with meals of at least 3 components vs. 1 or 2.)  2. Obtain twice as many veg's as protein or carbohydrate foods for both lunch and dinner. 3. Continue your exercise routine (which is GREAT!).  4. Set aside planning time each Monday for the week's meals and snacks.    COMPLETE YOUR GOALS SHEET, AND BRING TO FOLLOW-UP.    Please send me your lab results:  [email or web address].

## 2014-04-19 ENCOUNTER — Ambulatory Visit (INDEPENDENT_AMBULATORY_CARE_PROVIDER_SITE_OTHER): Payer: Medicare Other | Admitting: Family Medicine

## 2014-04-19 ENCOUNTER — Encounter: Payer: Self-pay | Admitting: Family Medicine

## 2014-04-19 VITALS — Ht 59.0 in | Wt 194.3 lb

## 2014-04-19 DIAGNOSIS — R7303 Prediabetes: Secondary | ICD-10-CM

## 2014-04-19 DIAGNOSIS — R7309 Other abnormal glucose: Secondary | ICD-10-CM

## 2014-04-19 NOTE — Patient Instructions (Addendum)
Goals: 1. Exercise 4 X wk with personal trainer AND walk at least 30 minutes once a week.    - Walking will be on Saturday morning in the neighborhood or at the gym.   2. Vegetables with both lunch and dinner.   3. Weekly meal and snack planning on Mondays.    - Email Jeannie a list of 7-10 meals that fit the following criteria ([email or web address]):  - Relative quick & easy to prepare  - Fit your nutritional needs (protein, starch, veg's)  - Taste good  - When you email, let me know also if you have purchased a notebook for your meal planning and food record.    - Daily food record - Recommended book:   Legrand Como Pollen's book, "Cooked" (including chapter on microbes in food)

## 2014-04-19 NOTE — Progress Notes (Signed)
Medical Nutrition Therapy:  Appt start time: 1330 end time:  1430.  Assessment:  Primary concerns today: Blood sugar control.  Evanie completed her Goals Sheet, but said she doesn't think this tracking system is enough for her.  She wonders if keeping a food record will be helpful.  Still doing 4 X wk w/ personal trainer, but not yet doing a 5th workout per week.  Abeera has been pretty consistent in getting three meals a day, but has been less successful in getting vegetables twice daily.    24-hr recall suggests intake of 920 kcal:  (Up at 8 AM) B (8:30 AM)-  1 c f-f cot chs, 1/2 c blueberries, 2 c coffee, 2 tbsp cream, 3 tsp Splenda  240 Snk ( AM)-  --- L (1:30 PM)-  1 Eng muffin, 2 slc Amer chs, 20 oz Crystal Light     280 Snk ( PM)-  --- D (5:30 PM)-  4 oz flounder, salad, 1 tbsp dressing, 1/2 c fried okra, water   400 Snk ( PM)-  --- Typical day? No.  Usually has evening snack.  Progress Towards Goal(s):  In progress.   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  As evidenced by no veg's yesterday, and usual intake of veg's only a couple times a week.    Intervention:  Nutrition education.  Handouts given during visit include:  AVS  Thanksgiving handout  Holidays handout  Demonstrated degree of understanding via:  Teach Back   Monitoring/Evaluation:  Dietary intake, exercise, and body weight in 4 week(s).

## 2014-05-25 ENCOUNTER — Other Ambulatory Visit (HOSPITAL_BASED_OUTPATIENT_CLINIC_OR_DEPARTMENT_OTHER): Payer: Medicare Other

## 2014-05-25 ENCOUNTER — Ambulatory Visit (HOSPITAL_COMMUNITY)
Admission: RE | Admit: 2014-05-25 | Discharge: 2014-05-25 | Disposition: A | Discharge status: Home / Self Care | Payer: Medicare Other | Source: Ambulatory Visit | Attending: Oncology | Admitting: Oncology

## 2014-05-25 ENCOUNTER — Other Ambulatory Visit: Payer: Self-pay | Admitting: Oncology

## 2014-05-25 ENCOUNTER — Other Ambulatory Visit: Payer: Self-pay

## 2014-05-25 ENCOUNTER — Other Ambulatory Visit: Payer: Self-pay | Admitting: *Deleted

## 2014-05-25 DIAGNOSIS — K760 Fatty (change of) liver, not elsewhere classified: Secondary | ICD-10-CM | POA: Insufficient documentation

## 2014-05-25 DIAGNOSIS — C8110 Nodular sclerosis classical Hodgkin lymphoma, unspecified site: Secondary | ICD-10-CM

## 2014-05-25 DIAGNOSIS — J4550 Severe persistent asthma, uncomplicated: Secondary | ICD-10-CM

## 2014-05-25 LAB — CBC WITH DIFFERENTIAL/PLATELET
BASO%: 0.3 % (ref 0.0–2.0)
BASOS ABS: 0 10*3/uL (ref 0.0–0.1)
EOS ABS: 0.3 10*3/uL (ref 0.0–0.5)
EOS%: 3.3 % (ref 0.0–7.0)
HCT: 38.1 % (ref 34.8–46.6)
HGB: 12.1 g/dL (ref 11.6–15.9)
LYMPH#: 1.5 10*3/uL (ref 0.9–3.3)
LYMPH%: 15.7 % (ref 14.0–49.7)
MCH: 25.8 pg (ref 25.1–34.0)
MCHC: 31.8 g/dL (ref 31.5–36.0)
MCV: 81.2 fL (ref 79.5–101.0)
MONO#: 1 10*3/uL — ABNORMAL HIGH (ref 0.1–0.9)
MONO%: 10.1 % (ref 0.0–14.0)
NEUT%: 70.6 % (ref 38.4–76.8)
NEUTROS ABS: 6.7 10*3/uL — AB (ref 1.5–6.5)
Platelets: 272 10*3/uL (ref 145–400)
RBC: 4.69 10*6/uL (ref 3.70–5.45)
RDW: 17.4 % — AB (ref 11.2–14.5)
WBC: 9.4 10*3/uL (ref 3.9–10.3)

## 2014-05-25 LAB — COMPREHENSIVE METABOLIC PANEL (CC13)
ALT: 26 U/L (ref 0–55)
ANION GAP: 8 meq/L (ref 3–11)
AST: 23 U/L (ref 5–34)
Albumin: 3.3 g/dL — ABNORMAL LOW (ref 3.5–5.0)
Alkaline Phosphatase: 95 U/L (ref 40–150)
BUN: 10.7 mg/dL (ref 7.0–26.0)
CO2: 24 meq/L (ref 22–29)
CREATININE: 0.7 mg/dL (ref 0.6–1.1)
Calcium: 8.5 mg/dL (ref 8.4–10.4)
Chloride: 105 mEq/L (ref 98–109)
EGFR: 85 mL/min/{1.73_m2} — AB (ref >90)
Glucose: 101 mg/dl (ref 70–140)
Potassium: 4 mEq/L (ref 3.5–5.1)
Sodium: 138 mEq/L (ref 136–145)
Total Bilirubin: 0.32 mg/dL (ref 0.20–1.20)
Total Protein: 6.7 g/dL (ref 6.4–8.3)

## 2014-05-25 LAB — URIC ACID (CC13): URIC ACID, SERUM: 4.3 mg/dL (ref 2.6–7.4)

## 2014-05-25 LAB — HEMOGLOBIN A1C
Hgb A1c MFr Bld: 6.6 % — ABNORMAL HIGH (ref <5.7)
Mean Plasma Glucose: 143 mg/dL — ABNORMAL HIGH (ref <117)

## 2014-05-25 LAB — POCT I-STAT CREATININE: CREATININE: 0.7 mg/dL (ref 0.50–1.10)

## 2014-05-25 LAB — LACTATE DEHYDROGENASE (CC13): LDH: 113 U/L — AB (ref 125–245)

## 2014-05-25 MED ORDER — GADOBENATE DIMEGLUMINE 529 MG/ML IV SOLN
18.0000 mL | Freq: Once | INTRAVENOUS | Status: AC | PRN
  Administered 2014-05-25: 18 mL via INTRAVENOUS

## 2014-06-02 ENCOUNTER — Other Ambulatory Visit: Payer: Self-pay | Admitting: Oncology

## 2014-06-02 ENCOUNTER — Telehealth: Payer: Self-pay | Admitting: Oncology

## 2014-06-02 ENCOUNTER — Ambulatory Visit (HOSPITAL_BASED_OUTPATIENT_CLINIC_OR_DEPARTMENT_OTHER): Payer: 59 | Admitting: Oncology

## 2014-06-02 VITALS — BP 140/53 | HR 78 | Temp 98.4°F | Resp 18 | Ht <= 58 in | Wt 192.5 lb

## 2014-06-02 DIAGNOSIS — C8110 Nodular sclerosis classical Hodgkin lymphoma, unspecified site: Secondary | ICD-10-CM

## 2014-06-02 DIAGNOSIS — J4521 Mild intermittent asthma with (acute) exacerbation: Secondary | ICD-10-CM

## 2014-06-02 NOTE — Progress Notes (Unsigned)
ID: Erika [name]   DOB: 16-Sep-1945  MR#: [id number]  NWG#:[id number]  PCP: Particia Jasper White GYN: SU: OTHER MD: Letta Moynahan, Scott McDiarmid, Gaynelle Arabian, Vara Guardian, Bronson Ing  CHIEF COMPLAINT: Possibly recurrent Hodgkin's disease  CURRENT THERAPY: Observation  HISTORY OF PRESENT ILLNESS: From the original 2010 intake note:  Erika [name] had been feeling tired for a year or two, that being more tired than would be normal she feels.  She has also lost approximately 60 pounds in the last two years.  She interpreted that as "a gift from God", and was just delighted, but then more recently, in addition to the weight loss and fatigue, she started having nausea and vomiting.  She was also having drenching night sweats, which she interprets, possibly accurately, as hot flashes, but possibly they could be due to the tumor.  In any case, she saw Dr. Dema Severin for this, and Dr. Dema Severin thought she really deserved admission for further evaluation.  This was done on June 13, 2008.  Studies that admission included an admission CBC - white cells 20.2, hemoglobin 11.6, and platelets 123,000.  Coags were normal, and CMP was normal except for an alkaline phosphatase of 215, and an ALT of 37.  Calcium was 9.0, albumin 2.4, creatinine 0.60.  LDH was 125, and the ionized calcium was normal at 1.2.  Anemia panel showed a reticulocyte count of 65, a B12 of 434, a folate of 9 and a ferritin of 261.  ESR was 102, CA-125, CEA and CA19 were all normal, as was urine immunofixation and serum protein electrophoresis and immunofixation.  Her total IgA was 637, which is elevated. Her total IgG was 1280, and her total IgM was 284, which is minimally elevated.  Multiple cultures including blood x two, bronchial washings for AFB and urine were negative, or in the case of urine, non-informative.    On June 17, 2008 she underwent bronchoscopy with the pathology (C10-127, C10-126, and C10-119) showing a few  atypical cells, but basically being nondiagnostic.  During this admission the patient had a CT of the chest, which showed scattered bilateral scarring and left lower lobe atelectasis, as well as some hilar and subcarinal adenopathy.  There was a 3 cm hepatic lobe lesion in the right hepatic lobe, and a second smaller lesion in the same area.  The spleen appeared normal.  There were multiple retroperitoneal surgical clips from her prior surgery for ovarian cancer, and multiple mildly enlarged retroperitoneal lymph nodes.  The pelvis was status post hysterectomy, but no significant adenopathy, and the largest nodal mass measures 4.9 x 2.8 cm in the subcarinal area.  There were also multiple abnormal axillary lymph nodes noted, and there were multiple bilateral sub-centimeter pulmonary nodules predominant in the upper lobes, the largest measuring 7 mm.    On June 19, 2008, the patient had an upper endoscopy by Dr. Michail Sermon, which showed mild gastritis, small hiatal hernia and a mildly tortuous esophagus, but no stricture or malignancy.  On July 01, 2008, the patient has ultrasound-guided biopsy of a right supraclavicular lymph node, which showed (S10-662 and C10-212) predominantly blood and fibrous adipose tissue.    Finally, on July 11, 2008, the patient underwent excision of a deep left axillary lymph node under Dr. 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  The flow cytometry showed no monoclonal B-cell population or abnormal T-cell phenotype, and the cells in question were  CD30, CD15 positive, and negative for CD45, CD3 and other markers.    The patient was further staged with a PET scan obtained June 29, 2008.  This showed extensive hypermetabolic lymphadenopathy in the soft tissues of the neck, axilla, mediastinum, retroperitoneum and small bowel mesentery.  There was a  hypermetabolic left upper lobe nodule measuring 7.3 mm with an SUV of 6.3. There was a large focus of intense involvement in the thoracic spine, suggestive of bone involvement.  The right hepatic lobe 2.9 cm lesion had an SUV of 35.6.  There were at least two foci of mildly increased FDG uptake in the spleen as well.    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   INTERVAL HISTORY: Erika [name] returns today for followup of her Hodgkin's lymphoma accompanied by her husband Erika [name]. The interval history is unremarkable. She enjoyed the holidays and generally feels "great". She has started an exercise program with a trainer and goes 5 days a week for 30 minutes. She is hoping to be able to increase that to 45 minutes within the next few weeks. She has also met with a nutritionist and is making some changes in her diet. In addition, of course, she just had her restaging MRI of the liver which to our mutual delight shows the spleen lesion we have been following to have shrunk by half (from 1.9 cm to 1.0 cm.) Note that Erika [name] has not received any steroids for her asthma in the last 6 months.  REVIEW OF SYSTEMS: Erika [name] is still having problems with insomnia, stress urinary incontinence, and some anxiety and depression issues. She tells me her blood sugars are better control. A detailed review of systems was otherwise stable.  PAST MEDICAL HISTORY: Past Medical History  Diagnosis Date  . hodgkins lymphoma dx'd 07/14/09 & 05/2009    chemo comp; bone marrow transplant  . Asthma   . OSA (obstructive sleep apnea)     not using CPAP- unable to tolerate  . Diabetes mellitus without complication     " watching sugars"  Depression.  She has a history of migraines, although these are rare.  She has a history of asthma, which is treated by Dr. Dema Severin, a history of tobacco abuse, the patient quitting  in 1992, smoking about one-half pack per day for perhaps 30 years.  There is a history of ovarian cancer, and she underwent TAH/BSO in 1999.  I do not have those records.  She also had at least partial omentectomy by her account.  This was done under Dr. Lemont Fillers and Dr. Marti Sleigh.  The patient was told that she did not need any adjuvant treatment, and indeed she has been disease free now for more than ten years.  She has a small hiatal hernia.  She has significant osteoarthritis involving the knees, which makes it difficult for her to ambulate.  She has a history of hypercholesterolemia, and she has a history of stasis dermatitis involving the left ankle.    PAST SURGICAL HISTORY: Past Surgical History  Procedure Laterality Date  . Cholecystectomy  1966  . Vesicovaginal fistula closure w/ tah  1999  . Bone marrow transplant  2011   FAMILY HISTORY Family History  Problem Relation Age of Onset  . Heart disease Father   The patient's father died at the age of 24 from congestive heart failure.  The patient's mother died  at the age of 3 with Alzheimer's disease.  The patient has one brother and one sister, but there is no other cancer in the family.   GYNECOLOGIC HISTORY: She is GX P1, first pregnancy to term at age 66.  After her hysterectomy and salpingo oophorectomy she started estrogen replacement on which she continues.  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  Her husband, Erika [name], present today, was a Freight forwarder for [employer].  The patient has a son from an earlier marriage, Erika [name], who lives in [place], [place], and works in IT for a hospital there.  Erika [name] has four children of his own all in this area.  There are a total of 14 grandchildren and 12 great-grandchildren.  The patient attends [employer], and is very active in that community.    ADVANCED DIRECTIVES:  HEALTH MAINTENANCE: History  Substance Use Topics  . Smoking status:  Former Smoker -- 0.25 packs/day for 10 years    Types: Cigarettes    Quit date: 05/27/1992  . Smokeless tobacco: Never Used  . Alcohol Use: Yes     Comment: socially     Colonoscopy:  PAP:  Bone density:  Lipid panel:  Allergies  Allergen Reactions  . Cephalexin   . Sudafed [Pseudoephedrine Hcl] Itching and Swelling    Current Outpatient Prescriptions  Medication Sig Dispense Refill  . ARIPiprazole (ABILIFY) 5 MG tablet Take 5 mg by mouth daily.    . Calcium Carbonate-Vit D-Min (CALTRATE PLUS PO) Take 1 tablet by mouth daily.    Marland Kitchen escitalopram (LEXAPRO) 20 MG tablet Take 10 mg by mouth daily.     Marland Kitchen esomeprazole (NEXIUM) 40 MG capsule Take 30-60 min before first meal of the day    . eszopiclone (LUNESTA) 2 MG TABS tablet Take 2 mg by mouth at bedtime as needed for sleep. Take immediately before bedtime    . famotidine (PEPCID) 20 MG tablet One at bedtime 30 tablet 2  . fluticasone (FLONASE) 50 MCG/ACT nasal spray Place 1 spray into both nostrils daily.    . mirabegron ER (MYRBETRIQ) 50 MG TB24 tablet Take 50 mg by mouth daily.    . mometasone-formoterol (DULERA) 100-5 MCG/ACT AERO Take 2 puffs first thing in am and then another 2 puffs about 12 hours later. 1 Inhaler 11  . montelukast (SINGULAIR) 10 MG tablet Take 10 mg by mouth at bedtime.    . pirbuterol (MAXAIR) 200 MCG/INH inhaler Inhale 2 puffs into the lungs 4 (four) times daily as needed for wheezing or shortness of breath.    . pramipexole (MIRAPEX) 0.5 MG tablet Take 1 mg by mouth at bedtime.     Marland Kitchen UNABLE TO FIND Med Name: 749 (nyst/taccr,aqua) cream as needed     No current facility-administered medications for this visit.    OBJECTIVE: Middle-aged white woman who appears stated age  There were no vitals filed for this visit.   There is no weight on file to calculate BMI.    ECOG FS: 1 There were no vitals filed for this visit. Sclerae unicteric, EOMs intact  Oropharynx clear and moist No cervical or  supraclavicular adenopathy. No axillary or inguinal adenopathy Lungs no rales or rhonchi, poor excursion bilaterally Heart regular rate and rhythm  Abdomen soft, obese, nontender to palpation, positive bowel sounds MSK significant kyphosis kyphosis but no focal spinal tenderness  No upper extremity lymphedema Neuro: nonfocal, well oriented, positive affect Breasts: Deferred  LAB RESULTS: Lab Results  Component Value Date   WBC 9.4 05/25/2014   NEUTROABS 6.7* 05/25/2014   HGB 12.1 05/25/2014   HCT 38.1 05/25/2014   MCV 81.2 05/25/2014   PLT 272 05/25/2014      Chemistry      Component Value Date/Time   NA 138 05/25/2014 1109   NA 139 12/17/2011 1314   K 4.0 05/25/2014 1109   K 3.9 12/17/2011 1314   CL 105 06/15/2012 1257   CL 106 12/17/2011 1314   CO2 24 05/25/2014 1109   CO2 25 12/17/2011 1314   BUN 10.7 05/25/2014 1109   BUN 13 12/17/2011 1314   CREATININE 0.70 05/25/2014 1228   CREATININE 0.7 05/25/2014 1109      Component Value Date/Time   CALCIUM 8.5 05/25/2014 1109   CALCIUM 8.6 12/17/2011 1314   ALKPHOS 95 05/25/2014 1109   ALKPHOS 121* 12/17/2011 1314   AST 23 05/25/2014 1109   AST 19 12/17/2011 1314   ALT 26 05/25/2014 1109   ALT 20 12/17/2011 1314   BILITOT 0.32 05/25/2014 1109   BILITOT 0.3 12/17/2011 1314      STUDIES: Mr Abdomen W Wo Contrast  05/25/2014   CLINICAL DATA:  History of Hodgkin's lymphoma. Followup splenic lesions.  EXAM: MRI ABDOMEN WITHOUT AND WITH CONTRAST  TECHNIQUE: Multiplanar multisequence MR imaging of the abdomen was performed both before and after the administration of intravenous contrast.  CONTRAST:  68m MULTIHANCE GADOBENATE DIMEGLUMINE 529 MG/ML IV SOLN  COMPARISON:  11/25/2013  FINDINGS: Lower chest: No pleural effusion identified. No pericardial effusion.  Hepatobiliary: No focal liver abnormality identified. There is diffuse hepatic steatosis noted. Previous cholecystectomy. The common bile duct measures 1.3 cm in maximum  diameter. No obstructing stone or mass.  Pancreas: Normal appearance of the pancreas. No pancreatic duct dilatation.  Spleen: Splenic lesion measures 1 cm, image 33 of series 1202 . Previously 1.9 cm. No new splenic lesions identified.  Adrenals/Urinary Tract: The adrenal glands are both normal. Normal appearance of both kidneys.  Stomach/Bowel: The stomach and upper abdominal bowel loops are unremarkable.  Vascular/Lymphatic: Normal caliber of the abdominal aorta. Lymph node dissection clips are again noted within the retroperitoneum. No upper abdominal adenopathy identified.  Other: No free fluid or fluid collections identified.  Musculoskeletal: Normal signal from within the bone marrow identified  IMPRESSION: 1. The previously described splenic lesion has decreased in size from previous exam. No new or progressive disease identified within the spleen. 2. Hepatic steatosis. 3. Increase caliber of the common bile duct status post cholecystectomy. There is no obstructing stone or mass noted.   Electronically Signed   By: TKerby MoorsM.D.   On: 05/25/2014 14:35     ASSESSMENT: 69y.o. [place] woman   (1)  with a history of nodular sclerosing Hodgkin's disease, clinically stage IV at presentation, treated with 6 cycles of AVD (the bleomycin was held secondary to concerns regarding possible pulmonary toxicity). Chemotherapy was completed August of 2010.   (2)  She had biopsy confirmed recurrence December of 2010, [facility] 2 [facility] stem cell rescue April of 2011.  (3) hypermetabolic lesion in the spleen noted by PET scan 10/19/2013  (a) "triple vaccine" given 10/26/2013  PLAN: I am not sure what the lesion in Linell's spleen is, but it has shrunk by half without any intervention. In particular she received no steroids in the last 6 months. This is of course very favorable. As far as  that is concerned she will see me again in  July and we will obtain a PET scan prior to that visit.  She was supposed to have had bilateral axillary ultrasonography to evaluate for bilateral axillary adenopathy, but for some reason that was not done last week. It is being rescheduled for the coming week. If there are any significant findings we can always proceed to biopsy of one of those notes.  Otherwise I am delighted that she is exercising regularly and dieting. I encouraged her to continue what she is doing and intensify her exercise program as soon as she feels capable of it. Of course she needs to work closely with her nutritionist to maintain an adequate weight loss diet  Liridona knows to call for any problems that may develop before her next visit here. MAGRINAT,GUSTAV C    06/02/2014

## 2014-06-02 NOTE — Progress Notes (Signed)
ID: Erika [name]   DOB: 05-21-1946  MR#: [id number]  CHY#:[id number]  PCP: Particia Jasper White GYN: SU: OTHER MD: Letta Moynahan, Scott McDiarmid, Gaynelle Arabian, Vara Guardian, Bronson Ing  CHIEF COMPLAINT: Possibly recurrent Hodgkin's disease  CURRENT THERAPY: Observation  HISTORY OF PRESENT ILLNESS: From the original 2010 intake note:  Erika [name] had been feeling tired for a year or two, that being more tired than would be normal she feels.  She has also lost approximately 60 pounds in the last two years.  She interpreted that as "a gift from God", and was just delighted, but then more recently, in addition to the weight loss and fatigue, she started having nausea and vomiting.  She was also having drenching night sweats, which she interprets, possibly accurately, as hot flashes, but possibly they could be due to the tumor.  In any case, she saw Dr. Dema Severin for this, and Dr. Dema Severin thought she really deserved admission for further evaluation.  This was done on June 13, 2008.  Studies that admission included an admission CBC - white cells 20.2, hemoglobin 11.6, and platelets 123,000.  Coags were normal, and CMP was normal except for an alkaline phosphatase of 215, and an ALT of 37.  Calcium was 9.0, albumin 2.4, creatinine 0.60.  LDH was 125, and the ionized calcium was normal at 1.2.  Anemia panel showed a reticulocyte count of 65, a B12 of 434, a folate of 9 and a ferritin of 261.  ESR was 102, CA-125, CEA and CA19 were all normal, as was urine immunofixation and serum protein electrophoresis and immunofixation.  Her total IgA was 637, which is elevated. Her total IgG was 1280, and her total IgM was 284, which is minimally elevated.  Multiple cultures including blood x two, bronchial washings for AFB and urine were negative, or in the case of urine, non-informative.    On June 17, 2008 she underwent bronchoscopy with the pathology (C10-127, C10-126, and C10-119) showing a few  atypical cells, but basically being nondiagnostic.  During this admission the patient had a CT of the chest, which showed scattered bilateral scarring and left lower lobe atelectasis, as well as some hilar and subcarinal adenopathy.  There was a 3 cm hepatic lobe lesion in the right hepatic lobe, and a second smaller lesion in the same area.  The spleen appeared normal.  There were multiple retroperitoneal surgical clips from her prior surgery for ovarian cancer, and multiple mildly enlarged retroperitoneal lymph nodes.  The pelvis was status post hysterectomy, but no significant adenopathy, and the largest nodal mass measures 4.9 x 2.8 cm in the subcarinal area.  There were also multiple abnormal axillary lymph nodes noted, and there were multiple bilateral sub-centimeter pulmonary nodules predominant in the upper lobes, the largest measuring 7 mm.    On June 19, 2008, the patient had an upper endoscopy by Dr. Michail Sermon, which showed mild gastritis, small hiatal hernia and a mildly tortuous esophagus, but no stricture or malignancy.  On July 01, 2008, the patient has ultrasound-guided biopsy of a right supraclavicular lymph node, which showed (S10-662 and C10-212) predominantly blood and fibrous adipose tissue.    Finally, on July 11, 2008, the patient underwent excision of a deep left axillary lymph node under Dr. 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  The flow cytometry showed no monoclonal B-cell population or abnormal T-cell phenotype, and the cells in question were  CD30, CD15 positive, and negative for CD45, CD3 and other markers.    The patient was further staged with a PET scan obtained June 29, 2008.  This showed extensive hypermetabolic lymphadenopathy in the soft tissues of the neck, axilla, mediastinum, retroperitoneum and small bowel mesentery.  There was a  hypermetabolic left upper lobe nodule measuring 7.3 mm with an SUV of 6.3. There was a large focus of intense involvement in the thoracic spine, suggestive of bone involvement.  The right hepatic lobe 2.9 cm lesion had an SUV of 35.6.  There were at least two foci of mildly increased FDG uptake in the spleen as well.    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   INTERVAL HISTORY: Erika [name] returns today for followup of her Hodgkin's lymphoma accompanied by her husband Erika [name]. She tells me she is doing "great". She enjoyed the holidays. She has started an exercise program with a trainer and goes to the gym 30 minutes 5 days a week. She's also met with a nutritionist and dietitian and is changing her diet appropriately. As a result her weight is currently 10 pounds down as compared to last July.  REVIEW OF SYSTEMS: Erika [name] denies drenching sweats, fevers, rash, pruritus, or unexplained fatigue. She has problems with insomnia. She has stress urinary incontinence. She describes herself as anxious and depressed. Otherwise a detailed review of systems today was stable.  PAST MEDICAL HISTORY: Past Medical History  Diagnosis Date  . hodgkins lymphoma dx'd 07/14/09 & 05/2009    chemo comp; bone marrow transplant  . Asthma   . OSA (obstructive sleep apnea)     not using CPAP- unable to tolerate  . Diabetes mellitus without complication     " watching sugars"  Depression.  She has a history of migraines, although these are rare.  She has a history of asthma, which is treated by Dr. Dema Severin, a history of tobacco abuse, the patient quitting in 1992, smoking about one-half pack per day for perhaps 30 years.  There is a history of ovarian cancer, and she underwent TAH/BSO in 1999.  I do not have those records.  She also had at least partial omentectomy by her account.  This was done under Dr.  Lemont Fillers and Dr. Marti Sleigh.  The patient was told that she did not need any adjuvant treatment, and indeed she has been disease free now for more than ten years.  She has a small hiatal hernia.  She has significant osteoarthritis involving the knees, which makes it difficult for her to ambulate.  She has a history of hypercholesterolemia, and she has a history of stasis dermatitis involving the left ankle.    PAST SURGICAL HISTORY: Past Surgical History  Procedure Laterality Date  . Cholecystectomy  1966  . Vesicovaginal fistula closure w/ tah  1999  . Bone marrow transplant  2011   FAMILY HISTORY Family History  Problem Relation Age of Onset  . Heart disease Father   The patient's father died at the age of 20 from congestive heart failure.  The patient's mother died at the age of 67 with Alzheimer's disease.  The patient has one brother and one sister, but there is no other cancer in the family.   GYNECOLOGIC HISTORY: She is GX P1, first pregnancy to term at age 47.  After her hysterectomy and salpingo oophorectomy she started estrogen replacement on  which she continues.  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  Her husband, Erika [name], present today, was a Freight forwarder for [employer].  The patient has a son from an earlier marriage, Dellis Filbert, who lives in [place], [place], and works in IT for a hospital there.  Erika [name] has four children of his own all in this area.  There are a total of 14 grandchildren and 12 great-grandchildren.  The patient attends [employer], and is very active in that community.    ADVANCED DIRECTIVES:  HEALTH MAINTENANCE: History  Substance Use Topics  . Smoking status: Former Smoker -- 0.25 packs/day for 10 years    Types: Cigarettes    Quit date: 05/27/1992  . Smokeless tobacco: Never Used  . Alcohol Use: Yes     Comment: socially     Colonoscopy:  PAP:  Bone density:  Lipid panel:  Allergies  Allergen  Reactions  . Cephalexin   . Sudafed [Pseudoephedrine Hcl] Itching and Swelling    Current Outpatient Prescriptions  Medication Sig Dispense Refill  . ARIPiprazole (ABILIFY) 5 MG tablet Take 5 mg by mouth daily.    . Calcium Carbonate-Vit D-Min (CALTRATE PLUS PO) Take 1 tablet by mouth daily.    Marland Kitchen escitalopram (LEXAPRO) 20 MG tablet Take 10 mg by mouth daily.     Marland Kitchen esomeprazole (NEXIUM) 40 MG capsule Take 30-60 min before first meal of the day    . eszopiclone (LUNESTA) 2 MG TABS tablet Take 2 mg by mouth at bedtime as needed for sleep. Take immediately before bedtime    . famotidine (PEPCID) 20 MG tablet One at bedtime 30 tablet 2  . fluticasone (FLONASE) 50 MCG/ACT nasal spray Place 1 spray into both nostrils daily.    . mirabegron ER (MYRBETRIQ) 50 MG TB24 tablet Take 50 mg by mouth daily.    . mometasone-formoterol (DULERA) 100-5 MCG/ACT AERO Take 2 puffs first thing in am and then another 2 puffs about 12 hours later. 1 Inhaler 11  . montelukast (SINGULAIR) 10 MG tablet Take 10 mg by mouth at bedtime.    . pirbuterol (MAXAIR) 200 MCG/INH inhaler Inhale 2 puffs into the lungs 4 (four) times daily as needed for wheezing or shortness of breath.    . pramipexole (MIRAPEX) 0.5 MG tablet Take 1 mg by mouth at bedtime.     Marland Kitchen UNABLE TO FIND Med Name: 161 (nyst/taccr,aqua) cream as needed     No current facility-administered medications for this visit.    OBJECTIVE: Middle-aged white woman in no acute distress Filed Vitals:   06/02/14 1337  BP: 140/53  Pulse: 78  Temp: 98.4 F (36.9 C)  Resp: 18     Body mass index is 40.24 kg/(m^2).    ECOG FS: 1 Filed Weights   06/02/14 1337  Weight: 192 lb 8 oz (87.317 kg)   Sclerae unicteric, pupils round and equal Oropharynx clear, dentition in good repair, no thrush or other lesions No cervical or supraclavicular adenopathy. No axillary adenopathy though the right axilla felt slightly fuller than the left. No inguinal adenopathy Lungs no  rales or rhonchi Heart regular rate and rhythm  Abdomen soft, obese, nontender, positive bowel sounds MSK t kyphosis but no focal spinal tenderness  No upper extremity lymphedema Neuro: nonfocal, well oriented, positive affect Breasts: Deferred  LAB RESULTS: Lab Results  Component Value Date   WBC 9.4 05/25/2014   NEUTROABS 6.7* 05/25/2014   HGB 12.1 05/25/2014   HCT  38.1 05/25/2014   MCV 81.2 05/25/2014   PLT 272 05/25/2014      Chemistry      Component Value Date/Time   NA 138 05/25/2014 1109   NA 139 12/17/2011 1314   K 4.0 05/25/2014 1109   K 3.9 12/17/2011 1314   CL 105 06/15/2012 1257   CL 106 12/17/2011 1314   CO2 24 05/25/2014 1109   CO2 25 12/17/2011 1314   BUN 10.7 05/25/2014 1109   BUN 13 12/17/2011 1314   CREATININE 0.70 05/25/2014 1228   CREATININE 0.7 05/25/2014 1109      Component Value Date/Time   CALCIUM 8.5 05/25/2014 1109   CALCIUM 8.6 12/17/2011 1314   ALKPHOS 95 05/25/2014 1109   ALKPHOS 121* 12/17/2011 1314   AST 23 05/25/2014 1109   AST 19 12/17/2011 1314   ALT 26 05/25/2014 1109   ALT 20 12/17/2011 1314   BILITOT 0.32 05/25/2014 1109   BILITOT 0.3 12/17/2011 1314      STUDIES: Mr Abdomen W Wo Contrast  05/25/2014   CLINICAL DATA:  History of Hodgkin's lymphoma. Followup splenic lesions.  EXAM: MRI ABDOMEN WITHOUT AND WITH CONTRAST  TECHNIQUE: Multiplanar multisequence MR imaging of the abdomen was performed both before and after the administration of intravenous contrast.  CONTRAST:  24m MULTIHANCE GADOBENATE DIMEGLUMINE 529 MG/ML IV SOLN  COMPARISON:  11/25/2013  FINDINGS: Lower chest: No pleural effusion identified. No pericardial effusion.  Hepatobiliary: No focal liver abnormality identified. There is diffuse hepatic steatosis noted. Previous cholecystectomy. The common bile duct measures 1.3 cm in maximum diameter. No obstructing stone or mass.  Pancreas: Normal appearance of the pancreas. No pancreatic duct dilatation.  Spleen:  Splenic lesion measures 1 cm, image 33 of series 1202 . Previously 1.9 cm. No new splenic lesions identified.  Adrenals/Urinary Tract: The adrenal glands are both normal. Normal appearance of both kidneys.  Stomach/Bowel: The stomach and upper abdominal bowel loops are unremarkable.  Vascular/Lymphatic: Normal caliber of the abdominal aorta. Lymph node dissection clips are again noted within the retroperitoneum. No upper abdominal adenopathy identified.  Other: No free fluid or fluid collections identified.  Musculoskeletal: Normal signal from within the bone marrow identified  IMPRESSION: 1. The previously described splenic lesion has decreased in size from previous exam. No new or progressive disease identified within the spleen. 2. Hepatic steatosis. 3. Increase caliber of the common bile duct status post cholecystectomy. There is no obstructing stone or mass noted.   Electronically Signed   By: TKerby MoorsM.D.   On: 05/25/2014 14:35     ASSESSMENT: 69y.o. [place] woman   (1)  with a history of nodular sclerosing Hodgkin's disease, clinically stage IV at presentation, treated with 6 cycles of AVD (the bleomycin was held secondary to concerns regarding possible pulmonary toxicity). Chemotherapy was completed August of 2010.   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  (3) hypermetabolic lesion in the spleen noted by PET scan 10/19/2013  (a) "triple vaccine" given 10/26/2013  PLAN: I am delighted that SRashansplenic lesion, wherever it may be represent, has shrunk by 50% with no intervening treatment. This makes it unlikely that this would represent recurrent Hodgkin's disease. Note that she has received no steroids over the last 6 months as far as she remembers. I have asked her to note whenever she does take steroids for her  asthma or for any other  reason.  She was supposed to have had bilateral axillary ultrasound to assess the axillary adenopathy we noted previously. For some reason this was not done. Those orders are being placed again and hopefully those studies can be obtained in the next week or so. Assuming no surprises there, she is can return to see me in July and before that visit she will have a repeat PET scan.  In the meantime I affirmed and encouraged her exercise program, as well as her diet. As far as her insomnia is concerned I urged her to always get up at the same time every day, whether it's week and/or holiday or normal weekday, and also to keep the room cold at night. If she can get in the habit of doing those 2 things, she has some chance of being able to sleep through the night  Vinnie Level has a good understanding of the overall plan. She agrees with it. She knows to call for any problems that may develop before her next visit here.  MAGRINAT,GUSTAV C    06/02/2014

## 2014-06-02 NOTE — Telephone Encounter (Signed)
pt stated that she will call [facility] and sch [place]

## 2014-06-02 NOTE — Telephone Encounter (Signed)
per pof to sch pt appt-gave pt copy of sch -adv CS will call to sch PET

## 2014-06-03 NOTE — Addendum Note (Signed)
Addended by: Laureen Abrahams on: 06/03/2014 06:19 PM   Modules accepted: Medications

## 2014-06-14 ENCOUNTER — Ambulatory Visit (INDEPENDENT_AMBULATORY_CARE_PROVIDER_SITE_OTHER): Payer: 59 | Admitting: Family Medicine

## 2014-06-14 ENCOUNTER — Encounter: Payer: Self-pay | Admitting: Family Medicine

## 2014-06-14 VITALS — Ht 59.0 in | Wt 193.3 lb

## 2014-06-14 DIAGNOSIS — E119 Type 2 diabetes mellitus without complications: Secondary | ICD-10-CM

## 2014-06-14 NOTE — Progress Notes (Signed)
Medical Nutrition Therapy:  Appt start time: 1330 end time:  1430.  Assessment:  Primary concerns today: Blood sugar control.  Erika [name] is doing well with respect to sleep, taking a sleep aid as well as med for her RLS.  This week she increased exercise to 45 min 5 X wk; 30 min w/ personal trainer and aiming for 15 min bike.  So far she has only been able to last for 7 min on the bike.  As discussed, I believe adding an aerobic component to her exercise will be helpful for weight loss.    A positive change Erika [name] has made is that she is consistently eating at least 3 X day now.  She is seldom getting veg's or fruit, however.  She cites her husband's dislike of F&V as the main reason she does not eat them herself; usually 2 fruits per week and veg's 4 X wk.  We talked about this at length, and she agreed that she can certainly microwave some frozen veg's for herself for any meal at which they have not prepared veg's.    Erika [name] kept a food record for a couple of weeks, but stopped in Dec.  She has also not been tracking goals with her goals sheet, but is ready to get back at it.    A1C on 05-25-14 was 6.6.    24-hr recall:  (Up at 8 AM) B (8:30 AM)-  1 c coffee, 2 tsp Splenda, 1 T half&half, 1 banana Snk ( AM)-  water L (2 PM)-  6 saltines, cheddar chs, 2 c diet lemonade Snk ( PM)-  --- D (5 PM)-  1 pc Stouffer's lasagne, 16 oz diet lemonade Snk ( PM)-  --- Typical day? Yes.       Demonstrated degree of understanding via:  Teach Back   Monitoring/Evaluation:  Dietary intake, exercise, and body weight in 4 week(s).

## 2014-06-14 NOTE — Patient Instructions (Addendum)
-   Stationary bike:  Work your way up to 15 minutes, then keep track of your speed and total distance, and try to increase this.  - Email [email or web address] a list of 7-10 meals that you like, that meet your nutritional needs (include a veg, a protein, and a starch), and they should be relatively easy to prepare.   - GOALS:  1. Eat at least one veg serving and one fruit per day.     2. Weekly planning:  Set aside time each Wed AM to plan meals for the next 7 days.  From this plan, you can make your shopping list and determine when you will shop.    3. End-of week review:  Which meal plans did you follow-up on, or what were your alternatives?  - Bring your completed goals sheet to follow-up.

## 2014-07-12 ENCOUNTER — Ambulatory Visit: Payer: 59 | Admitting: Family Medicine

## 2014-07-25 ENCOUNTER — Ambulatory Visit: Payer: 59 | Admitting: Family Medicine

## 2014-08-25 ENCOUNTER — Ambulatory Visit (INDEPENDENT_AMBULATORY_CARE_PROVIDER_SITE_OTHER): Payer: Medicare Other | Admitting: Family Medicine

## 2014-08-25 ENCOUNTER — Encounter: Payer: Self-pay | Admitting: Family Medicine

## 2014-08-25 VITALS — Ht <= 58 in | Wt 188.4 lb

## 2014-08-25 DIAGNOSIS — E1310 Other specified diabetes mellitus with ketoacidosis without coma: Secondary | ICD-10-CM

## 2014-08-25 DIAGNOSIS — E1110 Type 2 diabetes mellitus with ketoacidosis without coma: Secondary | ICD-10-CM

## 2014-08-25 NOTE — Progress Notes (Signed)
Medical Nutrition Therapy:  Appt start time: 1430 end time:  3903.  Assessment:  Primary concerns today: Blood sugar control.  Erika [name] exercises with a Physiological scientist for 30 min Tue thru Fri, followed by 15 min stat bike.  She occasionally walks the dog.  She has significantly increased her veg intake in the past couple of months.  They have been keeping a jar of home-made 3-bean salad in the refrigerator that they use at least several times a week, often as a snack.  They are also microwaving frozen veg's.  Have not yet tried roasted veg's.  She is making a big effort to avoid sweets such as ice cream.  Weight is down ~5 lb in ~10 weeks.    Most challenging at this time for her is to continue to get adequate vegetables.    24-hr recall:  (Up at 9 AM) B (9:30 AM)-  1 Yoplait, s-f lemonade, coffee, 1 tbsp cream, swtnr Snk ( AM)-   L (12 PM)-  McD Egg McMuffin (on bagel), coffee, 1 tbsp cream, swtnr Snk (4 PM)-  Handful of Cheezits D (5 PM)-  1 fish sandwich on English muffin, tossed salad, 1 1/2 tbsp ranch drsng, s-f lemonade Snk (9 PM)-  s-f lemonade Typical day? No.  Usually up ~6 AM; has coffee, yogurt, then exercises.  Lunch is usually egg sal sandw w/ carrots; dinner sometimes has more veg's and/or fruit.    Demonstrated degree of understanding via:  Teach Back   Monitoring/Evaluation:  Dietary intake, exercise, and body weight prn.  She will follow up with Dr. Harlan Stains, and knows how to reach me if she decides to schedule a nutr appt.

## 2014-08-25 NOTE — Patient Instructions (Signed)
-   Try some roasted veg's:  Spray pan with olive oil; add bite-size veg's (unless doing asparagus by itself; keep stalks whole).  Include onion wedges and halved garlic cloves, and any other veg's you want.  Roast at 400 degrees roughly 35-45 min or till pierced with a fork easily.    - Variation:  Pour over veg's a mixture of 2:1 ratio of balsamic vinegar to wine.   Goals:  Veg's twice daily CONSISTENTLY.    Continue to try new veg recipes and ideas, including trying some local veg's from the farmer's market.    Physical activity:  Make an effort to not sit or lie down for more than 90 min continuously; move your body for at least 5-10 min after sitting this length of time.  This helps to keep  the sugars and fats lower in your blood stream.   - Fruits:  Usually fresh are better than canned.  (Of course, avoid the syrup in canned fruits.)  A few exceptions:  Watermelon, bananas, grapes, and sometimes oranges raise blood sugar more than other fruits.  If using those higher-glycemic fruits, limit portion size, i.e., 1/2 banana.  Low-glycemic (fresh) fruits include apples, pears, peaches, plums, cantaloupe, all the berries.     - Call Dr. Orest Dikes office to see when you should be coming in next.  I would suggest you get an A1C measured no later than June.

## 2014-11-25 ENCOUNTER — Other Ambulatory Visit: Payer: Self-pay

## 2014-11-25 DIAGNOSIS — C8110 Nodular sclerosis classical Hodgkin lymphoma, unspecified site: Secondary | ICD-10-CM

## 2014-11-29 ENCOUNTER — Other Ambulatory Visit: Payer: 59

## 2014-11-29 ENCOUNTER — Other Ambulatory Visit: Payer: Self-pay | Admitting: Oncology

## 2014-11-29 DIAGNOSIS — C8190 Hodgkin lymphoma, unspecified, unspecified site: Secondary | ICD-10-CM

## 2014-12-05 ENCOUNTER — Ambulatory Visit: Payer: 59 | Admitting: Oncology

## 2014-12-18 ENCOUNTER — Encounter (HOSPITAL_COMMUNITY): Payer: Self-pay | Admitting: *Deleted

## 2014-12-18 ENCOUNTER — Emergency Department (HOSPITAL_COMMUNITY)
Admission: EM | Admit: 2014-12-18 | Discharge: 2014-12-18 | Disposition: A | Discharge status: Home / Self Care | Payer: Medicare Other | Attending: Emergency Medicine | Admitting: Emergency Medicine

## 2014-12-18 DIAGNOSIS — Z87891 Personal history of nicotine dependence: Secondary | ICD-10-CM | POA: Diagnosis not present

## 2014-12-18 DIAGNOSIS — J45909 Unspecified asthma, uncomplicated: Secondary | ICD-10-CM | POA: Insufficient documentation

## 2014-12-18 DIAGNOSIS — R112 Nausea with vomiting, unspecified: Secondary | ICD-10-CM | POA: Diagnosis present

## 2014-12-18 DIAGNOSIS — Z9049 Acquired absence of other specified parts of digestive tract: Secondary | ICD-10-CM | POA: Insufficient documentation

## 2014-12-18 DIAGNOSIS — Z8571 Personal history of Hodgkin lymphoma: Secondary | ICD-10-CM | POA: Diagnosis not present

## 2014-12-18 DIAGNOSIS — Z79899 Other long term (current) drug therapy: Secondary | ICD-10-CM | POA: Insufficient documentation

## 2014-12-18 DIAGNOSIS — E119 Type 2 diabetes mellitus without complications: Secondary | ICD-10-CM | POA: Insufficient documentation

## 2014-12-18 LAB — CBC WITH DIFFERENTIAL/PLATELET
BASOS PCT: 0 % (ref 0–1)
Basophils Absolute: 0 10*3/uL (ref 0.0–0.1)
EOS ABS: 0.2 10*3/uL (ref 0.0–0.7)
Eosinophils Relative: 1 % (ref 0–5)
HCT: 39.5 % (ref 36.0–46.0)
Hemoglobin: 12.9 g/dL (ref 12.0–15.0)
Lymphocytes Relative: 11 % — ABNORMAL LOW (ref 12–46)
Lymphs Abs: 1.7 10*3/uL (ref 0.7–4.0)
MCH: 26 pg (ref 26.0–34.0)
MCHC: 32.7 g/dL (ref 30.0–36.0)
MCV: 79.5 fL (ref 78.0–100.0)
MONO ABS: 1.2 10*3/uL — AB (ref 0.1–1.0)
Monocytes Relative: 8 % (ref 3–12)
NEUTROS ABS: 12.3 10*3/uL — AB (ref 1.7–7.7)
NEUTROS PCT: 80 % — AB (ref 43–77)
PLATELETS: 284 10*3/uL (ref 150–400)
RBC: 4.97 MIL/uL (ref 3.87–5.11)
RDW: 17.3 % — AB (ref 11.5–15.5)
WBC: 15.4 10*3/uL — ABNORMAL HIGH (ref 4.0–10.5)

## 2014-12-18 LAB — COMPREHENSIVE METABOLIC PANEL
ALT: 20 U/L (ref 14–54)
AST: 20 U/L (ref 15–41)
Albumin: 3.9 g/dL (ref 3.5–5.0)
Alkaline Phosphatase: 91 U/L (ref 38–126)
Anion gap: 9 (ref 5–15)
BUN: 9 mg/dL (ref 6–20)
CALCIUM: 8.7 mg/dL — AB (ref 8.9–10.3)
CO2: 23 mmol/L (ref 22–32)
CREATININE: 0.64 mg/dL (ref 0.44–1.00)
Chloride: 106 mmol/L (ref 101–111)
Glucose, Bld: 108 mg/dL — ABNORMAL HIGH (ref 65–99)
Potassium: 4.1 mmol/L (ref 3.5–5.1)
Sodium: 138 mmol/L (ref 135–145)
TOTAL PROTEIN: 7.7 g/dL (ref 6.5–8.1)
Total Bilirubin: 0.3 mg/dL (ref 0.3–1.2)

## 2014-12-18 LAB — LIPASE, BLOOD: Lipase: 17 U/L — ABNORMAL LOW (ref 22–51)

## 2014-12-18 MED ORDER — LORAZEPAM 2 MG/ML IJ SOLN
1.0000 mg | Freq: Once | INTRAMUSCULAR | Status: AC
  Administered 2014-12-18: 1 mg via INTRAVENOUS
  Filled 2014-12-18: qty 1

## 2014-12-18 MED ORDER — ONDANSETRON HCL 4 MG/2ML IJ SOLN
4.0000 mg | Freq: Once | INTRAMUSCULAR | Status: AC
  Administered 2014-12-18: 4 mg via INTRAVENOUS
  Filled 2014-12-18: qty 2

## 2014-12-18 MED ORDER — ONDANSETRON 4 MG PO TBDP: ORAL_TABLET | ORAL | Status: DC

## 2014-12-18 NOTE — ED Notes (Signed)
Pt cannot use restroom at this time, aware specimen is needed. 

## 2014-12-18 NOTE — ED Provider Notes (Signed)
CSN: [id number]     Arrival date & time 12/18/14  36 History   First MD Initiated Contact with Patient 12/18/14 1638     Chief Complaint  Patient presents with  . Nausea  . Emesis     (Consider location/radiation/quality/duration/timing/severity/associated sxs/prior Treatment) HPI Erika [name] is a 69 y.o. 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 Patient states approximately 3:00 this afternoon she began throwing up. Nonbloody nonbilious. She denies any abdominal discomfort. She reports last thing she ate was earlier this morning and had an English muffin. She denies any other medical problems at this time other than emesis. Reports last bowel movement was last night and normal for her. No other modifying factors.  Past Medical History  Diagnosis Date  . hodgkins lymphoma dx'd 07/14/09 & 05/2009    chemo comp; bone marrow transplant  . Asthma   . OSA (obstructive sleep apnea)     not using CPAP- unable to tolerate  . Diabetes mellitus without complication     " watching sugars"   Past Surgical History  Procedure Laterality Date  . Cholecystectomy  1966  . Vesicovaginal fistula closure w/ tah  1999  . Bone marrow transplant  2011   Family History  Problem Relation Age of Onset  . Heart disease Father    History  Substance Use Topics  . Smoking status: Former Smoker -- 0.25 packs/day for 10 years    Types: Cigarettes    Quit date: 05/27/1992  . Smokeless tobacco: Never Used  . Alcohol Use: Yes     Comment: socially   OB History    No data available     Review of Systems A 10 point review of systems was completed and was negative except for pertinent positives and negatives as mentioned in the history of present illness     Allergies  Cephalexin and Sudafed  Home Medications   Prior to Admission medications   Medication Sig Start Date End Date Taking?  Authorizing Provider  Calcium Carbonate-Vit D-Min (CALTRATE PLUS PO) Take 2 tablets by mouth 2 (two) times daily.    Yes Historical Provider, MD  esomeprazole (NEXIUM) 40 MG capsule Take 30-60 min before first meal of the day 12/13/13  Yes Tanda Rockers, MD  famotidine (PEPCID) 20 MG tablet One at bedtime Patient taking differently: Take 20 mg by mouth at bedtime. One at bedtime 04/02/13  Yes Tanda Rockers, MD  montelukast (SINGULAIR) 10 MG tablet Take 10 mg by mouth at bedtime.   Yes Historical Provider, MD  pirbuterol (MAXAIR) 200 MCG/INH inhaler Inhale 2 puffs into the lungs 4 (four) times daily as needed for wheezing or shortness of breath.   Yes Historical Provider, MD  pramipexole (MIRAPEX) 0.5 MG tablet Take 1 mg by mouth at bedtime.  09/20/11  Yes Historical Provider, MD  ARIPiprazole (ABILIFY) 5 MG tablet Take 5 mg by mouth daily.    Historical Provider, MD  Cetirizine HCl (ZYRTEC ALLERGY PO) Take by mouth.    Historical Provider, MD  escitalopram (LEXAPRO) 20 MG tablet Take 10 mg by mouth daily.     Historical Provider, MD  eszopiclone (LUNESTA) 2 MG TABS tablet Take 2 mg by mouth at bedtime as needed for sleep. Take immediately before bedtime    Historical Provider, MD  mirabegron ER (MYRBETRIQ) 50 MG TB24 tablet Take 50 mg by mouth daily.    Historical Provider, MD  ondansetron (ZOFRAN ODT) 4 MG disintegrating tablet 4mg  ODT q4 hours prn nausea/vomit 12/18/14   Briston Lax, PA-C  UNABLE TO FIND Med Name: 941 (nyst/taccr,aqua) cream as needed    Historical Provider, MD   BP 146/66 mmHg  Pulse 97  Temp(Src) 97.8 F (36.6 C) (Oral)  Resp 18  SpO2 96% Physical Exam  Constitutional: She is oriented to person, place, and time. She appears well-developed and well-nourished.  HENT:  Head: Normocephalic and atraumatic.  Mouth/Throat: Oropharynx is clear and moist.  Eyes: Conjunctivae are normal. Pupils are equal, round, and reactive to light. Right eye exhibits no discharge. Left eye  exhibits no discharge. No scleral icterus.  Neck: Neck supple.  Cardiovascular: Normal rate, regular rhythm and normal heart sounds.   Pulmonary/Chest: Effort normal and breath sounds normal. No respiratory distress. She has no wheezes. She has no rales.  Abdominal: Soft. There is no tenderness.  Abdomen soft, nondistended. There is no focal abdominal tenderness. No rebound or guarding.  Musculoskeletal: She exhibits no tenderness.  Neurological: She is alert and oriented to person, place, and time.  Cranial Nerves II-XII grossly intact  Skin: Skin is warm and dry. No rash noted.  Psychiatric: She has a normal mood and affect.  Nursing note and vitals reviewed.   ED Course  Procedures (including critical care time) Labs Review Labs Reviewed  CBC WITH DIFFERENTIAL/PLATELET - Abnormal; Notable for the following:    WBC 15.4 (*)    RDW 17.3 (*)    Neutrophils Relative % 80 (*)    Neutro Abs 12.3 (*)    Lymphocytes Relative 11 (*)    Monocytes Absolute 1.2 (*)    All other components within normal limits  COMPREHENSIVE METABOLIC PANEL - Abnormal; Notable for the following:    Glucose, Bld 108 (*)    Calcium 8.7 (*)    All other components within normal limits  LIPASE, BLOOD - Abnormal; Notable for the following:    Lipase 17 (*)    All other components within normal limits    Imaging Review No results found.   EKG Interpretation None      Meds given in ED:  Medications  ondansetron (ZOFRAN) injection 4 mg (4 mg Intravenous Given 12/18/14 1843)  LORazepam (ATIVAN) injection 1 mg (1 mg Intravenous Given 12/18/14 1935)    Discharge Medication List as of 12/18/2014  8:28 PM    START taking these medications   Details  ondansetron (ZOFRAN ODT) 4 MG disintegrating tablet 4mg  ODT q4 hours prn nausea/vomit, Print       Filed Vitals:   12/18/14 1632 12/18/14 1830  BP: 138/86 146/66  Pulse: 91 97  Temp: 97.8 F (36.6 C)   TempSrc: Oral   Resp: 18 18  SpO2: 98% 96%      MDM  Vitals stable - WNL -afebrile Pt resting comfortably in ED. Sx resolve with 1mg  Ativan PE--Benign abd exam. Tolerating PO Labwork--Leukocytosis likely reactive to Vomiting, no evidence of infx. Labs otherwise noncontributory  DDX--Pt with N/V without evidence of infx or other emergent condition. S/P cholecystectomy. Denies any CP, SOB, numbness or weakness. States she feels much better in ED after anti-emetics. Will DC home with Zofran. Pt and husband at bedside agree with plan and agree to return if sx worsen  I discussed all relevant lab findings and imaging results with pt and they verbalized understanding. Discussed f/u with PCP within 48 hrs and return precautions, pt very amenable to plan. Prior to patient discharge, I  discussed and reviewed this case with Dr.Docherty   Final diagnoses:  Non-intractable vomiting with nausea, vomiting of unspecified type        Comer Locket, PA-C 12/19/14 [place], MD 12/20/14 [phone]

## 2014-12-18 NOTE — Discharge Instructions (Signed)

## 2014-12-18 NOTE — ED Notes (Signed)
Pt reports n/v x3 today. C/o "full" feeling. Denies diarrhea or constipation.

## 2015-02-03 ENCOUNTER — Other Ambulatory Visit: Payer: Self-pay | Admitting: Family Medicine

## 2015-02-24 ENCOUNTER — Encounter (HOSPITAL_COMMUNITY): Payer: Self-pay

## 2015-02-24 ENCOUNTER — Ambulatory Visit (HOSPITAL_COMMUNITY)
Admission: RE | Admit: 2015-02-24 | Discharge: 2015-02-24 | Disposition: A | Discharge status: Home / Self Care | Payer: Medicare Other | Source: Ambulatory Visit | Attending: Family Medicine | Admitting: Family Medicine

## 2015-02-24 DIAGNOSIS — M810 Age-related osteoporosis without current pathological fracture: Secondary | ICD-10-CM | POA: Diagnosis present

## 2015-02-24 MED ORDER — SODIUM CHLORIDE 0.9 % IV SOLN
INTRAVENOUS | Status: DC
  Administered 2015-02-24: 250 mL via INTRAVENOUS

## 2015-02-24 MED ORDER — ZOLEDRONIC ACID 5 MG/100ML IV SOLN
5.0000 mg | Freq: Once | INTRAVENOUS | Status: AC
  Administered 2015-02-24: 5 mg via INTRAVENOUS
  Filled 2015-02-24: qty 100

## 2015-02-24 NOTE — Discharge Instructions (Signed)
Drink  Fluids/water as tolerated over the next 72 hours °Tylenol or ibuprofen OTC as directed °Continue Calcium and Vit D as directed by your MD °RECLAST °Zoledronic Acid injection (Paget's Disease, Osteoporosis) °What is this medicine? °ZOLEDRONIC ACID (ZOE le dron ik AS id) lowers the amount of calcium loss from bone. It is used to treat Paget's disease and osteoporosis in women. °This medicine may be used for other purposes; ask your health care provider or pharmacist if you have questions. °COMMON BRAND NAME(S): Reclast, Zometa °What should I tell my health care provider before I take this medicine? °They need to know if you have any of these conditions: °-aspirin-sensitive asthma °-cancer, especially if you are receiving medicines used to treat cancer °-dental disease or wear dentures °-infection °-kidney disease °-low levels of calcium in the blood °-past surgery on the parathyroid gland or intestines °-receiving corticosteroids like dexamethasone or prednisone °-an unusual or allergic reaction to zoledronic acid, other medicines, foods, dyes, or preservatives °-pregnant or trying to get pregnant °-breast-feeding °How should I use this medicine? °This medicine is for infusion into a vein. It is given by a health care professional in a hospital or clinic setting. °Talk to your pediatrician regarding the use of this medicine in children. This medicine is not approved for use in children. °Overdosage: If you think you have taken too much of this medicine contact a poison control center or emergency room at once. °NOTE: This medicine is only for you. Do not share this medicine with others. °What if I miss a dose? °It is important not to miss your dose. Call your doctor or health care professional if you are unable to keep an appointment. °What may interact with this medicine? °-certain antibiotics given by injection °-NSAIDs, medicines for pain and inflammation, like ibuprofen or naproxen °-some diuretics like  bumetanide, furosemide °-teriparatide °This list may not describe all possible interactions. Give your health care provider a list of all the medicines, herbs, non-prescription drugs, or dietary supplements you use. Also tell them if you smoke, drink alcohol, or use illegal drugs. Some items may interact with your medicine. °What should I watch for while using this medicine? °Visit your doctor or health care professional for regular checkups. It may be some time before you see the benefit from this medicine. Do not stop taking your medicine unless your doctor tells you to. Your doctor may order blood tests or other tests to see how you are doing. °Women should inform their doctor if they wish to become pregnant or think they might be pregnant. There is a potential for serious side effects to an unborn child. Talk to your health care professional or pharmacist for more information. °You should make sure that you get enough calcium and vitamin D while you are taking this medicine. Discuss the foods you eat and the vitamins you take with your health care professional. °Some people who take this medicine have severe bone, joint, and/or muscle pain. This medicine may also increase your risk for jaw problems or a broken thigh bone. Tell your doctor right away if you have severe pain in your jaw, bones, joints, or muscles. Tell your doctor if you have any pain that does not go away or that gets worse. °Tell your dentist and dental surgeon that you are taking this medicine. You should not have major dental surgery while on this medicine. See your dentist to have a dental exam and fix any dental problems before starting this medicine. Take good care   of your teeth while on this medicine. Make sure you see your dentist for regular follow-up appointments. °What side effects may I notice from receiving this medicine? °Side effects that you should report to your doctor or health care professional as soon as possible: °-allergic  reactions like skin rash, itching or hives, swelling of the face, lips, or tongue °-anxiety, confusion, or depression °-breathing problems °-changes in vision °-eye pain °-feeling faint or lightheaded, falls °-jaw pain, especially after dental work °-mouth sores °-muscle cramps, stiffness, or weakness °-trouble passing urine or change in the amount of urine °Side effects that usually do not require medical attention (report to your doctor or health care professional if they continue or are bothersome): °-bone, joint, or muscle pain °-constipation °-diarrhea °-fever °-hair loss °-irritation at site where injected °-loss of appetite °-nausea, vomiting °-stomach upset °-trouble sleeping °-trouble swallowing °-weak or tired °This list may not describe all possible side effects. Call your doctor for medical advice about side effects. You may report side effects to FDA at 1-800-FDA-1088. °Where should I keep my medicine? °This drug is given in a hospital or clinic and will not be stored at home. °NOTE: This sheet is a summary. It may not cover all possible information. If you have questions about this medicine, talk to your doctor, pharmacist, or health care provider. °© 2015, [employer]. (2012-10-26 10:03:48) °Osteoporosis °Throughout your life, your body breaks down old bone and replaces it with new bone. As you get older, your body does not replace bone as quickly as it breaks it down. By the age of 30 years, most people begin to gradually lose bone because of the imbalance between bone loss and replacement. Some people lose more bone than others. Bone loss beyond a specified normal degree is considered osteoporosis.  °Osteoporosis affects the strength and durability of your bones. The inside of the ends of your bones and your flat bones, like the bones of your pelvis, look like honeycomb, filled with tiny open spaces. As bone loss occurs, your bones become less dense. This means that the open spaces inside  your bones become bigger and the walls between these spaces become thinner. This makes your bones weaker. Bones of a person with osteoporosis can become so weak that they can break (fracture) during minor accidents, such as a simple fall. °CAUSES  °The following factors have been associated with the development of osteoporosis: °· Smoking. °· Drinking more than 2 alcoholic drinks several days per week. °· Long-term use of certain medicines: °¨ Corticosteroids. °¨ Chemotherapy medicines. °¨ Thyroid medicines. °¨ Antiepileptic medicines. °¨ Gonadal hormone suppression medicine. °¨ Immunosuppression medicine. °· Being underweight. °· Lack of physical activity. °· Lack of exposure to the sun. This can lead to vitamin D deficiency. °· Certain medical conditions: °¨ Certain inflammatory bowel diseases, such as Crohn disease and ulcerative colitis. °¨ Diabetes. °¨ Hyperthyroidism. °¨ Hyperparathyroidism. °RISK FACTORS °Anyone can develop osteoporosis. However, the following factors can increase your risk of developing osteoporosis: °· Gender--Women are at higher risk than men. °· Age--Being older than 50 years increases your risk. °· Ethnicity--White and Asian people have an increased risk. °· Weight --Being extremely underweight can increase your risk of osteoporosis. °· Family history of osteoporosis--Having a family member who has developed osteoporosis can increase your risk. °SYMPTOMS  °Usually, people with osteoporosis have no symptoms.  °DIAGNOSIS  °Signs during a physical exam that may prompt your caregiver to suspect osteoporosis include: °· Decreased height. This is usually caused by the   compression of the bones that form your spine (vertebrae) because they have weakened and become fractured. °· A curving or rounding of the upper back (kyphosis). °To confirm signs of osteoporosis, your caregiver may request a procedure that uses 2 low-dose X-ray beams with different levels of energy to measure your bone mineral  density (dual-energy X-ray absorptiometry [DXA]). Also, your caregiver may check your level of vitamin D. °TREATMENT  °The goal of osteoporosis treatment is to strengthen bones in order to decrease the risk of bone fractures. There are different types of medicines available to help achieve this goal. Some of these medicines work by slowing the processes of bone loss. Some medicines work by increasing bone density. Treatment also involves making sure that your levels of calcium and vitamin D are adequate. °PREVENTION  °There are things you can do to help prevent osteoporosis. Adequate intake of calcium and vitamin D can help you achieve optimal bone mineral density. Regular exercise can also help, especially resistance and weight-bearing activities. If you smoke, quitting smoking is an important part of osteoporosis prevention. °MAKE SURE YOU: °· Understand these instructions. °· Will watch your condition. °· Will get help right away if you are not doing well or get worse. °FOR MORE INFORMATION °www.osteo.org and www.nof.org °Document Released: 02/20/2005 Document Revised: 09/07/2012 Document Reviewed: 04/27/2011 °[employer]® Patient Information ©2015 [employer]. This information is not intended to replace advice given to you by your health care provider. Make sure you discuss any questions you have with your health care provider. ° ° °

## 2015-03-09 ENCOUNTER — Other Ambulatory Visit (HOSPITAL_BASED_OUTPATIENT_CLINIC_OR_DEPARTMENT_OTHER): Payer: Medicare Other

## 2015-03-09 DIAGNOSIS — C8110 Nodular sclerosis classical Hodgkin lymphoma, unspecified site: Secondary | ICD-10-CM | POA: Diagnosis not present

## 2015-03-09 LAB — CBC WITH DIFFERENTIAL/PLATELET
BASO%: 0.4 % (ref 0.0–2.0)
BASOS ABS: 0 10*3/uL (ref 0.0–0.1)
EOS%: 2.7 % (ref 0.0–7.0)
Eosinophils Absolute: 0.2 10*3/uL (ref 0.0–0.5)
HCT: 36.8 % (ref 34.8–46.6)
HGB: 11.8 g/dL (ref 11.6–15.9)
LYMPH%: 22.9 % (ref 14.0–49.7)
MCH: 25.5 pg (ref 25.1–34.0)
MCHC: 32.1 g/dL (ref 31.5–36.0)
MCV: 79.4 fL — AB (ref 79.5–101.0)
MONO#: 0.9 10*3/uL (ref 0.1–0.9)
MONO%: 9.9 % (ref 0.0–14.0)
NEUT%: 64.1 % (ref 38.4–76.8)
NEUTROS ABS: 5.9 10*3/uL (ref 1.5–6.5)
PLATELETS: 292 10*3/uL (ref 145–400)
RBC: 4.63 10*6/uL (ref 3.70–5.45)
RDW: 17.8 % — ABNORMAL HIGH (ref 11.2–14.5)
WBC: 9.2 10*3/uL (ref 3.9–10.3)
lymph#: 2.1 10*3/uL (ref 0.9–3.3)

## 2015-03-09 LAB — COMPREHENSIVE METABOLIC PANEL (CC13)
ALT: 17 U/L (ref 0–55)
ANION GAP: 7 meq/L (ref 3–11)
AST: 15 U/L (ref 5–34)
Albumin: 3.3 g/dL — ABNORMAL LOW (ref 3.5–5.0)
Alkaline Phosphatase: 102 U/L (ref 40–150)
BUN: 12.7 mg/dL (ref 7.0–26.0)
CO2: 23 meq/L (ref 22–29)
Calcium: 8.4 mg/dL (ref 8.4–10.4)
Chloride: 109 mEq/L (ref 98–109)
Creatinine: 0.7 mg/dL (ref 0.6–1.1)
EGFR: 86 mL/min/{1.73_m2} — AB (ref >90)
Glucose: 82 mg/dl (ref 70–140)
POTASSIUM: 4.2 meq/L (ref 3.5–5.1)
SODIUM: 139 meq/L (ref 136–145)
TOTAL PROTEIN: 6.7 g/dL (ref 6.4–8.3)
Total Bilirubin: <0.3 mg/dL (ref 0.20–1.20)

## 2015-03-13 ENCOUNTER — Other Ambulatory Visit: Payer: Self-pay | Admitting: Oncology

## 2015-03-13 ENCOUNTER — Ambulatory Visit (HOSPITAL_COMMUNITY)
Admission: RE | Admit: 2015-03-13 | Discharge: 2015-03-13 | Disposition: A | Discharge status: Home / Self Care | Payer: Medicare Other | Source: Ambulatory Visit | Attending: Oncology | Admitting: Oncology

## 2015-03-13 DIAGNOSIS — Z08 Encounter for follow-up examination after completed treatment for malignant neoplasm: Secondary | ICD-10-CM | POA: Diagnosis present

## 2015-03-13 DIAGNOSIS — K229 Disease of esophagus, unspecified: Secondary | ICD-10-CM | POA: Diagnosis not present

## 2015-03-13 DIAGNOSIS — R590 Localized enlarged lymph nodes: Secondary | ICD-10-CM | POA: Insufficient documentation

## 2015-03-13 DIAGNOSIS — C8190 Hodgkin lymphoma, unspecified, unspecified site: Secondary | ICD-10-CM | POA: Diagnosis present

## 2015-03-13 LAB — GLUCOSE, CAPILLARY: Glucose-Capillary: 98 mg/dL (ref 65–99)

## 2015-03-13 MED ORDER — FLUDEOXYGLUCOSE F - 18 (FDG) INJECTION
9.2000 | Freq: Once | INTRAVENOUS | Status: DC | PRN
  Administered 2015-03-13: 9.2 via INTRAVENOUS
  Filled 2015-03-13: qty 9.2

## 2015-03-16 ENCOUNTER — Other Ambulatory Visit: Payer: Self-pay | Admitting: Oncology

## 2015-03-16 ENCOUNTER — Ambulatory Visit (HOSPITAL_BASED_OUTPATIENT_CLINIC_OR_DEPARTMENT_OTHER): Payer: Medicare Other | Admitting: Oncology

## 2015-03-16 VITALS — BP 126/73 | HR 76 | Temp 98.1°F | Resp 18 | Ht <= 58 in | Wt 185.3 lb

## 2015-03-16 DIAGNOSIS — C8118 Nodular sclerosis classical Hodgkin lymphoma, lymph nodes of multiple sites: Secondary | ICD-10-CM

## 2015-03-16 DIAGNOSIS — Z8571 Personal history of Hodgkin lymphoma: Secondary | ICD-10-CM

## 2015-03-16 NOTE — Progress Notes (Signed)
ID: Erika [name]   DOB: 09-30-1945  MR#: [id number]  HKU#:[id number]  PCP: Particia Jasper White GYN: SU: OTHER MD: Letta Moynahan, Scott McDiarmid, Gaynelle Arabian, Vara Guardian, Bronson Ing  CHIEF COMPLAINT: History of Hodgkin's disease  CURRENT THERAPY: Observation  HISTORY OF PRESENT ILLNESS: From the original 2010 intake note:  Prapti had been feeling tired for a year or two, that being more tired than would be normal she feels.  She has also lost approximately 60 pounds in the last two years.  She interpreted that as "a gift from God", and was just delighted, but then more recently, in addition to the weight loss and fatigue, she started having nausea and vomiting.  She was also having drenching night sweats, which she interprets, possibly accurately, as hot flashes, but possibly they could be due to the tumor.  In any case, she saw Dr. Dema Severin for this, and Dr. Dema Severin thought she really deserved admission for further evaluation.  This was done on June 13, 2008.  Studies that admission included an admission CBC - white cells 20.2, hemoglobin 11.6, and platelets 123,000.  Coags were normal, and CMP was normal except for an alkaline phosphatase of 215, and an ALT of 37.  Calcium was 9.0, albumin 2.4, creatinine 0.60.  LDH was 125, and the ionized calcium was normal at 1.2.  Anemia panel showed a reticulocyte count of 65, a B12 of 434, a folate of 9 and a ferritin of 261.  ESR was 102, CA-125, CEA and CA19 were all normal, as was urine immunofixation and serum protein electrophoresis and immunofixation.  Her total IgA was 637, which is elevated. Her total IgG was 1280, and her total IgM was 284, which is minimally elevated.  Multiple cultures including blood x two, bronchial washings for AFB and urine were negative, or in the case of urine, non-informative.    On June 17, 2008 she underwent bronchoscopy with the pathology (C10-127, C10-126, and C10-119) showing a few atypical  cells, but basically being nondiagnostic.  During this admission the patient had a CT of the chest, which showed scattered bilateral scarring and left lower lobe atelectasis, as well as some hilar and subcarinal adenopathy.  There was a 3 cm hepatic lobe lesion in the right hepatic lobe, and a second smaller lesion in the same area.  The spleen appeared normal.  There were multiple retroperitoneal surgical clips from her prior surgery for ovarian cancer, and multiple mildly enlarged retroperitoneal lymph nodes.  The pelvis was status post hysterectomy, but no significant adenopathy, and the largest nodal mass measures 4.9 x 2.8 cm in the subcarinal area.  There were also multiple abnormal axillary lymph nodes noted, and there were multiple bilateral sub-centimeter pulmonary nodules predominant in the upper lobes, the largest measuring 7 mm.    On June 19, 2008, the patient had an upper endoscopy by Dr. Michail Sermon, which showed mild gastritis, small hiatal hernia and a mildly tortuous esophagus, but no stricture or malignancy.  On July 01, 2008, the patient has ultrasound-guided biopsy of a right supraclavicular lymph node, which showed (S10-662 and C10-212) predominantly blood and fibrous adipose tissue.    Finally, on July 11, 2008, the patient underwent excision of a deep left axillary lymph node under Dr. 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  The flow cytometry showed no monoclonal B-cell population or abnormal T-cell phenotype, and the cells in question were  CD30, CD15 positive, and negative for CD45, CD3 and other markers.    The patient was further staged with a PET scan obtained June 29, 2008.  This showed extensive hypermetabolic lymphadenopathy in the soft tissues of the neck, axilla, mediastinum, retroperitoneum and small bowel mesentery.  There was a hypermetabolic left  upper lobe nodule measuring 7.3 mm with an SUV of 6.3. There was a large focus of intense involvement in the thoracic spine, suggestive of bone involvement.  The right hepatic lobe 2.9 cm lesion had an SUV of 35.6.  There were at least two foci of mildly increased FDG uptake in the spleen as well.    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   INTERVAL HISTORY: Aniah returns today for followup of her Hodgkin's lymphoma accompanied by her husband [name].  We just restage her with a PET scan, which shows no evidence of disease recurrence. There is a lymph node in the left axillary tail which we evaluated by ultrasonography in July. At that time it measured 1.3 cm, was soft by elastography and showed no increased vascularity. It now measures 0.7 cm. It appears benign  REVIEW OF SYSTEMS: Anushri denies unexplained weight loss or unexplained fatigue, rash, and per chairs, drenching sweats, pruritus, or adenopathy. She has stress urinary incontinence problems. She has a bleeding spot "on the vagina" which is being followed by Dr. Wendy Poet. She has a history of depression, well controlled on current medications. She had a colonoscopy within the last year under [facility] when she tells me was unremarkable. She exercises of the gym 4 days a week. A detailed review of systems today was otherwise stable  PAST MEDICAL HISTORY: Past Medical History  Diagnosis Date  . hodgkins lymphoma dx'd 70/18/11 & 05/2009    chemo comp; bone marrow transplant  . Asthma   . OSA (obstructive sleep apnea)     not using CPAP- unable to tolerate  . Diabetes mellitus without complication     " watching sugars"  Depression.  She has a history of migraines, although these are rare.  She has a history of asthma, which is treated by Dr. Dema Severin, a history of tobacco abuse, the patient quitting in 1992,  smoking about one-half pack per day for perhaps 30 years.  There is a history of ovarian cancer, and she underwent TAH/BSO in 1999.  I do not have those records.  She also had at least partial omentectomy by her account.  This was done under Dr. Lemont Fillers and Dr. Marti Sleigh.  The patient was told that she did not need any adjuvant treatment, and indeed she has been disease free now for more than ten years.  She has a small hiatal hernia.  She has significant osteoarthritis involving the knees, which makes it difficult for her to ambulate.  She has a history of hypercholesterolemia, and she has a history of stasis dermatitis involving the left ankle.    PAST SURGICAL HISTORY: Past Surgical History  Procedure Laterality Date  . Cholecystectomy  1966  . Vesicovaginal fistula closure w/ tah  1999  . Bone marrow transplant  2011   FAMILY HISTORY Family History  Problem Relation Age of Onset  . Heart disease Father   The patient's father died at the age of 25 from congestive heart failure.  The patient's mother died at the age of 38 with Alzheimer's disease.  The patient has one brother and one sister, but there is no other cancer in the family.   GYNECOLOGIC HISTORY: She is GX P1, first pregnancy to term at age 88.  After her hysterectomy and salpingo oophorectomy she started estrogen replacement on which she continues.  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  Her husband, [name], present today, was a Freight forwarder for [employer].  The patient has a son from an earlier marriage, Dellis Filbert, who lives in [place], [place], and works in IT for a hospital there.  [name] has four children of his own all in this area.  There are a total of 14 grandchildren and 12 great-grandchildren.  The patient attends [employer], and is very active in that community.     HEALTH MAINTENANCE: Social History  Substance Use Topics  . Smoking status: Former Smoker -- 0.25  packs/day for 10 years    Types: Cigarettes    Quit date: 05/27/1992  . Smokeless tobacco: Never Used  . Alcohol Use: Yes     Comment: socially     Colonoscopy:  PAP:  Bone density:  Lipid panel:  Allergies  Allergen Reactions  . Cephalexin   . Sudafed [Pseudoephedrine Hcl] Itching and Swelling    Current Outpatient Prescriptions  Medication Sig Dispense Refill  . ARIPiprazole (ABILIFY) 5 MG tablet Take 5 mg by mouth daily.    . Calcium Carbonate-Vit D-Min (CALTRATE PLUS PO) Take 2 tablets by mouth 2 (two) times daily.     . Cetirizine HCl (ZYRTEC ALLERGY PO) Take by mouth.    . escitalopram (LEXAPRO) 20 MG tablet Take 10 mg by mouth daily.     Marland Kitchen esomeprazole (NEXIUM) 40 MG capsule Take 30-60 min before first meal of the day    . eszopiclone (LUNESTA) 2 MG TABS tablet Take 2 mg by mouth at bedtime as needed for sleep. Take immediately before bedtime    . famotidine (PEPCID) 20 MG tablet One at bedtime (Patient taking differently: Take 20 mg by mouth at bedtime. One at bedtime) 30 tablet 2  . mirabegron ER (MYRBETRIQ) 50 MG TB24 tablet Take 50 mg by mouth daily.    . montelukast (SINGULAIR) 10 MG tablet Take 10 mg by mouth at bedtime.    . ondansetron (ZOFRAN ODT) 4 MG disintegrating tablet 1m ODT q4 hours prn nausea/vomit 15 tablet 0  . pirbuterol (MAXAIR) 200 MCG/INH inhaler Inhale 2 puffs into the lungs 4 (four) times daily as needed for wheezing or shortness of breath.    . pramipexole (MIRAPEX) 0.5 MG tablet Take 1 mg by mouth at bedtime.     .Marland KitchenUNABLE TO FIND Med Name: 1073(nyst/taccr,aqua) cream as needed     No current facility-administered medications for this visit.   Facility-Administered Medications Ordered in Other Visits  Medication Dose Route Frequency Provider Last Rate Last Dose  . fludeoxyglucose F - 18 (FDG) injection 9.2 milli Curie  9.2 milli Curie Intravenous Once PRN Medication Radiologist, MD   9.2 milli Curie at 03/13/15 0830    OBJECTIVE: Middle-aged  white woman who appears stated age F80Vitals:   03/16/15 0941  BP: 126/73  Pulse: 76  Temp: 98.1 F (36.7 C)  Resp: 18     Body mass index is 38.74 kg/(m^2).    ECOG FS: 1 Filed Weights   03/16/15 0938 03/16/15 0941  Weight: 185 lb 4.8 oz (84.052 kg) 185 lb 4.8 oz (84.052 kg)   Sclerae unicteric,  EOMs intact Oropharynx clear, dentition in good repair No cervical or supraclavicular adenopathy, no axillary or inguinal adenopathy and specifically careful palpation of the left axilla and left breast axillary tail was entirely negative  Lungs no rales or rhonchi Heart regular rate and rhythm Abd soft, nontender, positive bowel sounds MSK no focal spinal tenderness, no upper extremity lymphedema Neuro: nonfocal, well oriented, appropriate affect Breasts: Breast exam unremarkable bilaterally  LAB RESULTS: Lab Results  Component Value Date   WBC 9.2 03/09/2015   NEUTROABS 5.9 03/09/2015   HGB 11.8 03/09/2015   HCT 36.8 03/09/2015   MCV 79.4* 03/09/2015   PLT 292 03/09/2015      Chemistry      Component Value Date/Time   NA 139 03/09/2015 0937   NA 138 12/18/2014 1710   K 4.2 03/09/2015 0937   K 4.1 12/18/2014 1710   CL 106 12/18/2014 1710   CL 105 06/15/2012 1257   CO2 23 03/09/2015 0937   CO2 23 12/18/2014 1710   BUN 12.7 03/09/2015 0937   BUN 9 12/18/2014 1710   CREATININE 0.7 03/09/2015 0937   CREATININE 0.64 12/18/2014 1710      Component Value Date/Time   CALCIUM 8.4 03/09/2015 0937   CALCIUM 8.7* 12/18/2014 1710   ALKPHOS 102 03/09/2015 0937   ALKPHOS 91 12/18/2014 1710   AST 15 03/09/2015 0937   AST 20 12/18/2014 1710   ALT 17 03/09/2015 0937   ALT 20 12/18/2014 1710   BILITOT <0.30 03/09/2015 0937   BILITOT 0.3 12/18/2014 1710      STUDIES: Nm Pet Image Restag (ps) Skull Base To Thigh  03/13/2015  CLINICAL DATA:  Subsequent treatment strategy for Hodgkin's lymphoma. EXAM: NUCLEAR MEDICINE PET SKULL BASE TO THIGH TECHNIQUE: 9.2 mCi F-18 FDG was  injected intravenously. Full-ring PET imaging was performed from the skull base to thigh after the radiotracer. CT data was obtained and used for attenuation correction and anatomic localization. FASTING BLOOD GLUCOSE:  Value: 98 mg/dl COMPARISON:  MRI abdomen dated 05/25/2014.  PET-CT dated 10/19/2013. FINDINGS: NECK No hypermetabolic lymph nodes in the neck. CHEST Evaluation of lung parenchyma is constrained by respiratory motion. No suspicious pulmonary nodules. No hypermetabolic mediastinal lymphadenopathy. Postsurgical changes related to prior left axillary lymph node dissection. 7 mm short axis left axillary node (series 4/image 56), max SUV 2.7, indeterminate. The heart is top-normal in size.  Coronary atherosclerosis. Mild residual hypermetabolism along the mid thoracic esophagus, max SUV 5.6, correlate for esophagitis. ABDOMEN/PELVIS No abnormal hypermetabolic activity within the liver, pancreas, adrenal glands, or spleen. No focal splenic hypermetabolism. No hypermetabolic lymph nodes in the abdomen or pelvis. Status post retroperitoneal lymphadenectomy. SKELETON No focal hypermetabolic activity to suggest skeletal metastasis. IMPRESSION: 7 mm short axis left axillary node with mild hypermetabolism, indeterminate. Overall, this is favored to be reactive rather than a solitary nodal recurrence. Otherwise, no findings specific for recurrent lymphoma. Mild hypermetabolism along the mid thoracic esophagus, correlate for esophagitis. Consider endoscopy as clinically warranted. Electronically Signed   By: Julian Hy M.D.   On: 03/13/2015 10:25     ASSESSMENT: 69 y.o. [place] woman   (1)  with a history of nodular sclerosing Hodgkin's disease, clinically stage IV at presentation, treated with 6 cycles of AVD (the bleomycin was held secondary to concerns regarding possible pulmonary toxicity). Chemotherapy was completed August of 2010.   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  (3) hypermetabolic lesion in the spleen noted by PET scan 10/19/2013  (a) "triple vaccine" given 10/26/2013  PLAN: [place] is now 5 years out from her autologous stem cell transplant, with no evidence of disease recurrence. She is almost certainly cured. The lymph node we have been following in the left axilla is radiologically benign and has decreased by half in the last 3 months with no intervention.  At this point I feel comfortable releasing her to her primary care physician's care. From a Hodgkin's disease point of view she requires no specific follow-up. Certainly if she developed any "B" symptoms we would want to see her again. These include unexplained severe fatigue, unexplained weight loss of more than 10% of body weight, persistent pruritus with no particular cause, obvious adenopathy, unexplained fevers, or drenching sweats.  Her MCV has dropped slightly. This may indicate mild iron deficiency. She had a colonoscopy not that long ago and she has no symptoms of gastritis. Possibly the small but persistent blood loss from the labial lesion may explain this. She will take an iron tablet once daily for the next few months. Her MCV should return to normal with that  I have not made any further routine appointments for Pami here, but of course I will be glad to see her at any point in the future if and when the need arises.  MAGRINAT,GUSTAV C    03/16/2015

## 2015-03-16 NOTE — Progress Notes (Unsigned)
ID: Erika [name]   DOB: 01/31/1946  MR#: [id number]  YHC#:[id number]  PCP: Harlan Stains GYN: SU: OTHER MD: Letta Moynahan, Scott McDiarmid, Gaynelle Arabian, Vara Guardian, Christinia Gully, Arnaldo Natal Medoff  CHIEF COMPLAINT: Hodgkin's lymphoma  CURRENT THERAPY: Observation  HISTORY OF PRESENT ILLNESS: From the original 2010 intake note:  Erika [name] had been feeling tired for a year or two, that being more tired than would be normal she feels.  She has also lost approximately 60 pounds in the last two years.  She interpreted that as "a gift from God", and was just delighted, but then more recently, in addition to the weight loss and fatigue, she started having nausea and vomiting.  She was also having drenching night sweats, which she interprets, possibly accurately, as hot flashes, but possibly they could be due to the tumor.  In any case, she saw Dr. Dema Severin for this, and Dr. Dema Severin thought she really deserved admission for further evaluation.  This was done on June 13, 2008.  Studies that admission included an admission CBC - white cells 20.2, hemoglobin 11.6, and platelets 123,000.  Coags were normal, and CMP was normal except for an alkaline phosphatase of 215, and an ALT of 37.  Calcium was 9.0, albumin 2.4, creatinine 0.60.  LDH was 125, and the ionized calcium was normal at 1.2.  Anemia panel showed a reticulocyte count of 65, a B12 of 434, a folate of 9 and a ferritin of 261.  ESR was 102, CA-125, CEA and CA19 were all normal, as was urine immunofixation and serum protein electrophoresis and immunofixation.  Her total IgA was 637, which is elevated. Her total IgG was 1280, and her total IgM was 284, which is minimally elevated.  Multiple cultures including blood x two, bronchial washings for AFB and urine were negative, or in the case of urine, non-informative.    On June 17, 2008 she underwent bronchoscopy with the pathology (C10-127, C10-126, and C10-119) showing a few  atypical cells, but basically being nondiagnostic.  During this admission the patient had a CT of the chest, which showed scattered bilateral scarring and left lower lobe atelectasis, as well as some hilar and subcarinal adenopathy.  There was a 3 cm hepatic lobe lesion in the right hepatic lobe, and a second smaller lesion in the same area.  The spleen appeared normal.  There were multiple retroperitoneal surgical clips from her prior surgery for ovarian cancer, and multiple mildly enlarged retroperitoneal lymph nodes.  The pelvis was status post hysterectomy, but no significant adenopathy, and the largest nodal mass measures 4.9 x 2.8 cm in the subcarinal area.  There were also multiple abnormal axillary lymph nodes noted, and there were multiple bilateral sub-centimeter pulmonary nodules predominant in the upper lobes, the largest measuring 7 mm.    On June 19, 2008, the patient had an upper endoscopy by Dr. Michail Sermon, which showed mild gastritis, small hiatal hernia and a mildly tortuous esophagus, but no stricture or malignancy.  On July 01, 2008, the patient has ultrasound-guided biopsy of a right supraclavicular lymph node, which showed (S10-662 and C10-212) predominantly blood and fibrous adipose tissue.    Finally, on July 11, 2008, the patient underwent excision of a deep left axillary lymph node under Dr. Fanny Skates, and the pathology there [id number] and FC10-60) showed classical Hodgkin's lymphoma, most likely nodular sclerosis type in cellular phase.  The flow cytometry showed no monoclonal B-cell population or abnormal T-cell phenotype, and the cells in question were  CD30, CD15 positive, and negative for CD45, CD3 and other markers.    The patient was further staged with a PET scan obtained June 29, 2008.  This showed extensive hypermetabolic lymphadenopathy in the soft tissues of the neck, axilla, mediastinum, retroperitoneum and small bowel mesentery.  There was a  hypermetabolic left upper lobe nodule measuring 7.3 mm with an SUV of 6.3. There was a large focus of intense involvement in the thoracic spine, suggestive of bone involvement.  The right hepatic lobe 2.9 cm lesion had an SUV of 35.6.  There were at least two foci of mildly increased FDG uptake in the spleen as well.    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   INTERVAL HISTORY: Erika [name] returns today for followup of her Hodgkin's lymphoma accompanied by her husband Erika [name]. She just had a restaging PET scan which is negative except for a 7 mm left axillary lymph node which we have been following. This measured 1.3 cm by ultrasonography in July. At that time elastography showed it to be soft and Doppler showed it to be nonvascular. This is clearly benign.   REVIEW OF SYSTEMS: Erika [name] denies unexplained fatigue or unexplained weight loss, fevers, wrenching sweats, rash, or operable adenopathy. She is exercising at a gym regularly. She has a "spot on the vagina" which bleeds occasionally. This is followed by Dr. McDiarmid. There has been no change in bowel habits and in particular no melena or bright red blood per rectum. She had a colonoscopy within the last year by Dr. Earlean Shawl she still a little bit depressed but this is well-controlled on current medications. A detailed review of systems today was otherwise stable  PAST MEDICAL HISTORY: Past Medical History  Diagnosis Date  . hodgkins lymphoma dx'd 07/14/09 & 05/2009    chemo comp; bone marrow transplant  . Asthma   . OSA (obstructive sleep apnea)     not using CPAP- unable to tolerate  . Diabetes mellitus without complication     " watching sugars"  Depression.  She has a history of migraines, although these are rare.  She has a history of asthma, which is treated by Dr. Dema Severin, a history of tobacco abuse, the patient  quitting in 1992, smoking about one-half pack per day for perhaps 30 years.  There is a history of ovarian cancer, and she underwent TAH/BSO in 1999.  I do not have those records.  She also had at least partial omentectomy by her account.  This was done under Dr. Lemont Fillers and Dr. Marti Sleigh.  The patient was told that she did not need any adjuvant treatment, and indeed she has been disease free now for more than ten years.  She has a small hiatal hernia.  She has significant osteoarthritis involving the knees, which makes it difficult for her to ambulate.  She has a history of hypercholesterolemia, and she has a history of stasis dermatitis involving the left ankle.    PAST SURGICAL HISTORY: Past Surgical History  Procedure Laterality Date  . Cholecystectomy  1966  . Vesicovaginal fistula closure w/ tah  1999  . Bone marrow transplant  2011   FAMILY HISTORY Family History  Problem Relation Age of Onset  . Heart disease Father   The patient's father died at the age of 34 from congestive heart failure.  The patient's mother died at the age of 10 with  Alzheimer's disease.  The patient has one brother and one sister, but there is no other cancer in the family.   GYNECOLOGIC HISTORY: She is GX P1, first pregnancy to term at age 88.  After her hysterectomy and salpingo oophorectomy she started estrogen replacement on which she continues.  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  Her husband, Erika [name], present today, was a Freight forwarder for [employer].  The patient has a son from an earlier marriage, Erika [name], who lives in [place], [place], and works in IT for a hospital there.  Erika [name] has four children of his own all in this area.  There are a total of 14 grandchildren and 12 great-grandchildren.  The patient attends [employer], and is very active in that community.      HEALTH MAINTENANCE: Social History  Substance Use Topics  . Smoking status: Former  Smoker -- 0.25 packs/day for 10 years    Types: Cigarettes    Quit date: 05/27/1992  . Smokeless tobacco: Never Used  . Alcohol Use: Yes     Comment: socially     Colonoscopy:  PAP:  Bone density:  Lipid panel:  Allergies  Allergen Reactions  . Cephalexin   . Sudafed [Pseudoephedrine Hcl] Itching and Swelling    Current Outpatient Prescriptions  Medication Sig Dispense Refill  . ARIPiprazole (ABILIFY) 5 MG tablet Take 5 mg by mouth daily.    . Calcium Carbonate-Vit D-Min (CALTRATE PLUS PO) Take 2 tablets by mouth 2 (two) times daily.     . Cetirizine HCl (ZYRTEC ALLERGY PO) Take by mouth.    . escitalopram (LEXAPRO) 20 MG tablet Take 10 mg by mouth daily.     Marland Kitchen esomeprazole (NEXIUM) 40 MG capsule Take 30-60 min before first meal of the day    . eszopiclone (LUNESTA) 2 MG TABS tablet Take 2 mg by mouth at bedtime as needed for sleep. Take immediately before bedtime    . famotidine (PEPCID) 20 MG tablet One at bedtime (Patient taking differently: Take 20 mg by mouth at bedtime. One at bedtime) 30 tablet 2  . mirabegron ER (MYRBETRIQ) 50 MG TB24 tablet Take 50 mg by mouth daily.    . montelukast (SINGULAIR) 10 MG tablet Take 10 mg by mouth at bedtime.    . ondansetron (ZOFRAN ODT) 4 MG disintegrating tablet 67m ODT q4 hours prn nausea/vomit 15 tablet 0  . pirbuterol (MAXAIR) 200 MCG/INH inhaler Inhale 2 puffs into the lungs 4 (four) times daily as needed for wheezing or shortness of breath.    . pramipexole (MIRAPEX) 0.5 MG tablet Take 1 mg by mouth at bedtime.     .Marland KitchenUNABLE TO FIND Med Name: 1357(nyst/taccr,aqua) cream as needed     No current facility-administered medications for this visit.   Facility-Administered Medications Ordered in Other Visits  Medication Dose Route Frequency Provider Last Rate Last Dose  . fludeoxyglucose F - 18 (FDG) injection 9.2 milli Curie  9.2 milli Curie Intravenous Once PRN Medication Radiologist, MD   9.2 milli Curie at 03/13/15 0830     OBJECTIVE: Middle-aged white woman who appears stated age  There were no vitals filed for this visit.   There is no weight on file to calculate BMI.    ECOG FS: 1 There were no vitals filed for this visit. Sclerae unicteric, EOMs intact  Oropharynx clear and moist No cervical or supraclavicular adenopathy. No axillary or inguinal adenopathy Lungs no rales or rhonchi, poor  excursion bilaterally Heart regular rate and rhythm  Abdomen soft, obese, nontender to palpation, positive bowel sounds MSK significant kyphosis kyphosis but no focal spinal tenderness  No upper extremity lymphedema Neuro: nonfocal, well oriented, positive affect Breasts: Deferred  LAB RESULTS: Lab Results  Component Value Date   WBC 9.2 03/09/2015   NEUTROABS 5.9 03/09/2015   HGB 11.8 03/09/2015   HCT 36.8 03/09/2015   MCV 79.4* 03/09/2015   PLT 292 03/09/2015      Chemistry      Component Value Date/Time   NA 139 03/09/2015 0937   NA 138 12/18/2014 1710   K 4.2 03/09/2015 0937   K 4.1 12/18/2014 1710   CL 106 12/18/2014 1710   CL 105 06/15/2012 1257   CO2 23 03/09/2015 0937   CO2 23 12/18/2014 1710   BUN 12.7 03/09/2015 0937   BUN 9 12/18/2014 1710   CREATININE 0.7 03/09/2015 0937   CREATININE 0.64 12/18/2014 1710      Component Value Date/Time   CALCIUM 8.4 03/09/2015 0937   CALCIUM 8.7* 12/18/2014 1710   ALKPHOS 102 03/09/2015 0937   ALKPHOS 91 12/18/2014 1710   AST 15 03/09/2015 0937   AST 20 12/18/2014 1710   ALT 17 03/09/2015 0937   ALT 20 12/18/2014 1710   BILITOT <0.30 03/09/2015 0937   BILITOT 0.3 12/18/2014 1710      STUDIES: Left breast ultrasonography at [facility] July 18 showed a benign-appearing 1.3 cm axillary lymph node in the left axillary tail. This was stable as compared to prior. Color flow showed no vascularity. Elastography showed it to be soft   Nm Pet Image Restag (ps) Skull Base To Thigh  03/13/2015  CLINICAL DATA:  Subsequent treatment strategy for Hodgkin's  lymphoma. EXAM: NUCLEAR MEDICINE PET SKULL BASE TO THIGH TECHNIQUE: 9.2 mCi F-18 FDG was injected intravenously. Full-ring PET imaging was performed from the skull base to thigh after the radiotracer. CT data was obtained and used for attenuation correction and anatomic localization. FASTING BLOOD GLUCOSE:  Value: 98 mg/dl COMPARISON:  MRI abdomen dated 05/25/2014.  PET-CT dated 10/19/2013. FINDINGS: NECK No hypermetabolic lymph nodes in the neck. CHEST Evaluation of lung parenchyma is constrained by respiratory motion. No suspicious pulmonary nodules. No hypermetabolic mediastinal lymphadenopathy. Postsurgical changes related to prior left axillary lymph node dissection. 7 mm short axis left axillary node (series 4/image 56), max SUV 2.7, indeterminate. The heart is top-normal in size.  Coronary atherosclerosis. Mild residual hypermetabolism along the mid thoracic esophagus, max SUV 5.6, correlate for esophagitis. ABDOMEN/PELVIS No abnormal hypermetabolic activity within the liver, pancreas, adrenal glands, or spleen. No focal splenic hypermetabolism. No hypermetabolic lymph nodes in the abdomen or pelvis. Status post retroperitoneal lymphadenectomy. SKELETON No focal hypermetabolic activity to suggest skeletal metastasis. IMPRESSION: 7 mm short axis left axillary node with mild hypermetabolism, indeterminate. Overall, this is favored to be reactive rather than a solitary nodal recurrence. Otherwise, no findings specific for recurrent lymphoma. Mild hypermetabolism along the mid thoracic esophagus, correlate for esophagitis. Consider endoscopy as clinically warranted. Electronically Signed   By: Julian Hy M.D.   On: 03/13/2015 10:25     ASSESSMENT: 69 y.o. [place] woman   (1)  with a history of nodular sclerosing Hodgkin's disease, clinically stage IV at presentation, treated with 6 cycles of AVD (the bleomycin was held secondary to concerns regarding possible pulmonary toxicity). Chemotherapy was  completed August of 2010.   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  (3) hypermetabolic lesion in the spleen noted by PET scan 10/19/2013  (a) "triple vaccine" given 10/26/2013  PLAN:   Shomari's MCV has dropped slightly. If she has iron deficiency would be mild. We will check a ferritin with the next set of labs. She had a colonoscopy under Jeff Medoff last year. If she does not give me a history of gastritis symptoms. She does have a little bit of bleeding from a vulvar lesion which Dr. McDiarmid has been following. This requires only follow-up at this point.   Gordana Kewley C 03/16/2015

## 2015-03-17 NOTE — Progress Notes (Signed)
Erika [name] from Dr. Dellis Filbert [facility] office called wondering why they received information on patient yesterday.  Per Dr. Jana Hakim he was sending patient's clinic visit notes to the physician as a courtesy.  Erika [name] states that patient has never been seen in their practice.

## 2015-09-01 ENCOUNTER — Ambulatory Visit (INDEPENDENT_AMBULATORY_CARE_PROVIDER_SITE_OTHER): Payer: Medicare Other | Admitting: Cardiology

## 2015-09-01 ENCOUNTER — Encounter: Payer: Self-pay | Admitting: Cardiology

## 2015-09-01 VITALS — BP 118/78 | HR 77 | Ht <= 58 in | Wt 188.6 lb

## 2015-09-01 DIAGNOSIS — R0789 Other chest pain: Secondary | ICD-10-CM

## 2015-09-01 DIAGNOSIS — R0600 Dyspnea, unspecified: Secondary | ICD-10-CM

## 2015-09-01 DIAGNOSIS — R0609 Other forms of dyspnea: Secondary | ICD-10-CM | POA: Diagnosis not present

## 2015-09-01 NOTE — Patient Instructions (Signed)
Medication Instructions:  The current medical regimen is effective;  continue present plan and medications.  Testing/Procedures: Your physician has requested that you have an echocardiogram. Echocardiography is a painless test that uses sound waves to create images of your heart. It provides your doctor with information about the size and shape of your heart and how well your heart's chambers and valves are working. This procedure takes approximately one hour. There are no restrictions for this procedure.  Your physician has requested that you have a lexiscan myoview. For further information please visit [email or web address]. Please follow instruction sheet, as given.  Follow-Up: Follow up as needed after testing.  If you need a refill on your cardiac medications before your next appointment, please call your pharmacy.  Thank you for choosing [facility]!!

## 2015-09-01 NOTE — Progress Notes (Signed)
Cardiology Office Note    Date:  09/01/2015   ID:  Erika [name], DOB 1945/12/13, MRN [id number]  PCP:  Vidal Schwalbe, MD  Cardiologist:   Candee Furbish, MD     History of Present Illness:  Erika [name] is a 70 y.o. female with history of Hodgkin's lymphoma (no radiation) status post bone marrow transplant [facility], diabetes, obstructive sleep apnea and asthma here for the evaluation of dyspnea on exertion at the request of Dr. Harlan Stains.  She was last seen by Dr. Dema Severin on 08/01/15 where she was noting spring seasonal allergies. She noted that she could no longer sleep flat due to shortness of breath. She had been sleeping in a recliner since early fall. She had not noticed any edema but she has noticed more shortness of breath with exertion, minimal distances, relieved with rest. No prior cardiac evaluation. Sometimes she will feel a sharp pain in her right axilla one time per week this is been present for many years. However recently she has felt some central pain like a mallet has hit her chest that she distinguishes from GERD. Her weight has increased 12 pounds recently. She just went to Weight [employer]. She is active in her church.  After getting a new Tempur-Pedic bed that can increase in height of the head of bed, her breathing has improved.  Hemoglobin A1c 6.3 she has seen Dr. Radford Pax previously for sleep apnea.  Back in 01/26/2009 she had an echo which showed normal ejection fraction, small pericardial effusion. This was reportedly done because of edema.  I personally reviewed CT scan of chest from 09/21/10 and this does not show any significant coronary calcification.    Past Medical History  Diagnosis Date  . hodgkins lymphoma dx'd 07/14/09 & 05/2009    chemo comp; bone marrow transplant  . Asthma   . OSA (obstructive sleep apnea)     not using CPAP- unable to tolerate    Past Surgical History  Procedure Laterality Date  . Cholecystectomy  1966  . Vesicovaginal fistula  closure w/ tah  1999  . Bone marrow transplant  2011    Current Medications: Outpatient Prescriptions Prior to Visit  Medication Sig Dispense Refill  . ARIPiprazole (ABILIFY) 5 MG tablet Take 5 mg by mouth daily.    . Calcium Carbonate-Vit D-Min (CALTRATE PLUS PO) Take 2 tablets by mouth 2 (two) times daily.     . Cetirizine HCl (ZYRTEC ALLERGY PO) Take by mouth.    . escitalopram (LEXAPRO) 20 MG tablet Take 10 mg by mouth daily.     Marland Kitchen esomeprazole (NEXIUM) 40 MG capsule Take 30-60 min before first meal of the day    . famotidine (PEPCID) 20 MG tablet One at bedtime (Patient taking differently: Take 20 mg by mouth at bedtime. One at bedtime) 30 tablet 2  . mirabegron ER (MYRBETRIQ) 50 MG TB24 tablet Take 50 mg by mouth daily.    . montelukast (SINGULAIR) 10 MG tablet Take 10 mg by mouth at bedtime.    Marland Kitchen UNABLE TO FIND Med Name: [id number] (nyst/taccr,aqua) cream as needed    . eszopiclone (LUNESTA) 2 MG TABS tablet Take 2 mg by mouth at bedtime as needed for sleep. Take immediately before bedtime    . ondansetron (ZOFRAN ODT) 4 MG disintegrating tablet 4mg  ODT q4 hours prn nausea/vomit 15 tablet 0  . pirbuterol (MAXAIR) 200 MCG/INH inhaler Inhale 2 puffs into the lungs 4 (four) times daily as needed for wheezing or shortness  of breath.    . pramipexole (MIRAPEX) 0.5 MG tablet Take 1 mg by mouth at bedtime.      No facility-administered medications prior to visit.     Allergies:   Cephalexin and Sudafed   Social History   Social History  . Marital Status: Married    Spouse Name: N/A  . Number of Children: N/A  . Years of Education: N/A   Occupational History  . Retired     Acupuncturist   Social History Main Topics  . Smoking status: Former Smoker -- 0.25 packs/day for 10 years    Types: Cigarettes    Quit date: 05/27/1992  . Smokeless tobacco: Never Used  . Alcohol Use: Yes     Comment: socially  . Drug Use: No  . Sexual Activity: Not Asked   Other Topics Concern   . None   Social History Narrative     Family History:  The patient's family history includes Heart disease in her father.  Died at 20.   ROS:   Please see the history of present illness.   Chest pain, waking up at night short of breath, shortness breath with activity, snoring, wheezing ROS All other systems reviewed and are negative.   PHYSICAL EXAM:   VS:  BP 118/78 mmHg  Pulse 77  Ht 4\' 10"  (1.473 m)  Wt 188 lb 9.6 oz (85.548 kg)  BMI 39.43 kg/m2   GEN: Well nourished, well developed, in no acute distress HEENT: normal Neck: no JVD, carotid bruits, or masses Cardiac: RRR; no murmurs, rubs, or gallops,no edema  Respiratory:  clear to auscultation bilaterally, normal work of breathing GI: soft, nontender, nondistended, + BS, obese MS: no deformity or atrophy Skin: warm and dry, no rash Neuro:  Alert and Oriented x 3, Strength and sensation are intact Psych: euthymic mood, full affect  Wt Readings from Last 3 Encounters:  09/01/15 188 lb 9.6 oz (85.548 kg)  03/16/15 185 lb 4.8 oz (84.052 kg)  02/24/15 185 lb (83.915 kg)      Studies/Labs Reviewed:   EKG:  EKG is ordered today.  09/01/15-normal sinus rhythm heart rate 76 bpm with no other abnormalities. Personally viewed.  Recent Labs: 03/09/2015: ALT 17; BUN 12.7; Creatinine 0.7; HGB 11.8; Platelets 292; Potassium 4.2; Sodium 139   Lipid Panel    Component Value Date/Time   CHOL 252* 02/14/2009 1417   TRIG 151* 02/14/2009 1417   HDL 86 02/14/2009 1417   CHOLHDL 2.9 02/14/2009 1417   VLDL 30 02/14/2009 1417   LDLCALC 136* 02/14/2009 1417    Additional studies/ records that were reviewed today include:  Prior labs, office note, EKG reviewed  FEV1 75% no significant obstruction on PFTs 12/10/13.  ASSESSMENT:    1. Dyspnea on exertion   2. Atypical chest pain   3. Morbid obesity due to excess calories (HCC)      PLAN:  In order of problems listed above:  Dyspnea on exertion -We will evaluate with  echocardiogram as well as nuclear stress test to ensure proper structure and function of her heart as well as evaluate for any evidence of ischemia. -She has been diagnosed previously with adult onset asthma and was followed by Dr. Melvyn Novas. If cardiac workup is unremarkable, consider further pulmonary evaluation.Reassuringly however she does not have any significant obstruction on PFTs in 2015. -Reassuringly, in 2012, there is no evidence of coronary calcification on CT scan. Also her echocardiogram in 2010 showed normal EF. -Shortness of  breath certainly could be multifactorial from morbid obesity as well.  Atypical chest pain -We will be checking a nuclear stress test, pharmacologic. Unable to walk treadmill.  Morbid obesity -Congratulated her on weight watchers. Continue. Decrease carbohydrates.  History of Hodgkin's lymphoma -No radiation therapy.  Obstructive sleep apnea -Not using CPAP, unable to tolerate  Former smoker -Admittedly misses it daily. Quit in 1994.    Medication Adjustments/Labs and Tests Ordered: Current medicines are reviewed at length with the patient today.  Concerns regarding medicines are outlined above.  Medication changes, Labs and Tests ordered today are listed in the Patient Instructions below. Patient Instructions  Medication Instructions:  The current medical regimen is effective;  continue present plan and medications.  Testing/Procedures: Your physician has requested that you have an echocardiogram. Echocardiography is a painless test that uses sound waves to create images of your heart. It provides your doctor with information about the size and shape of your heart and how well your heart's chambers and valves are working. This procedure takes approximately one hour. There are no restrictions for this procedure.  Your physician has requested that you have a lexiscan myoview. For further information please visit [email or web address]. Please follow  instruction sheet, as given.  Follow-Up: Follow up as needed after testing.  If you need a refill on your cardiac medications before your next appointment, please call your pharmacy.  Thank you for choosing [facility]!!          Signed, Candee Furbish, MD  09/01/2015 10:03 AM    [facility] [address], [place], [place]  [address] Phone: [phone]; Fax: [phone]

## 2015-09-18 ENCOUNTER — Telehealth (HOSPITAL_COMMUNITY): Payer: Self-pay | Admitting: *Deleted

## 2015-09-18 NOTE — Telephone Encounter (Signed)
Patient given detailed instructions per Myocardial Perfusion Study Information Sheet for the test on 09/21/15 at 1000. Patient notified to arrive 15 minutes early and that it is imperative to arrive on time for appointment to keep from having the test rescheduled.  If you need to cancel or reschedule your appointment, please call the office within 24 hours of your appointment. Failure to do so may result in a cancellation of your appointment, and a $50 no show fee. Patient verbalized understanding.Krislyn Donnan, Ranae Palms

## 2015-09-21 ENCOUNTER — Other Ambulatory Visit: Payer: Self-pay

## 2015-09-21 ENCOUNTER — Ambulatory Visit (HOSPITAL_BASED_OUTPATIENT_CLINIC_OR_DEPARTMENT_OTHER): Payer: Medicare Other

## 2015-09-21 ENCOUNTER — Ambulatory Visit (HOSPITAL_COMMUNITY): Payer: Medicare Other | Attending: Cardiology

## 2015-09-21 DIAGNOSIS — R0789 Other chest pain: Secondary | ICD-10-CM

## 2015-09-21 DIAGNOSIS — G4733 Obstructive sleep apnea (adult) (pediatric): Secondary | ICD-10-CM | POA: Diagnosis not present

## 2015-09-21 DIAGNOSIS — I059 Rheumatic mitral valve disease, unspecified: Secondary | ICD-10-CM | POA: Diagnosis not present

## 2015-09-21 DIAGNOSIS — R0600 Dyspnea, unspecified: Secondary | ICD-10-CM

## 2015-09-21 DIAGNOSIS — Z6839 Body mass index (BMI) 39.0-39.9, adult: Secondary | ICD-10-CM | POA: Insufficient documentation

## 2015-09-21 DIAGNOSIS — E669 Obesity, unspecified: Secondary | ICD-10-CM | POA: Insufficient documentation

## 2015-09-21 DIAGNOSIS — Z8249 Family history of ischemic heart disease and other diseases of the circulatory system: Secondary | ICD-10-CM | POA: Diagnosis not present

## 2015-09-21 DIAGNOSIS — Z87891 Personal history of nicotine dependence: Secondary | ICD-10-CM | POA: Insufficient documentation

## 2015-09-21 DIAGNOSIS — I358 Other nonrheumatic aortic valve disorders: Secondary | ICD-10-CM | POA: Diagnosis not present

## 2015-09-21 DIAGNOSIS — E119 Type 2 diabetes mellitus without complications: Secondary | ICD-10-CM | POA: Insufficient documentation

## 2015-09-21 DIAGNOSIS — R0609 Other forms of dyspnea: Secondary | ICD-10-CM | POA: Insufficient documentation

## 2015-09-21 DIAGNOSIS — I517 Cardiomegaly: Secondary | ICD-10-CM | POA: Insufficient documentation

## 2015-09-21 MED ORDER — PERFLUTREN LIPID MICROSPHERE
1.0000 mL | INTRAVENOUS | Status: AC | PRN
  Administered 2015-09-21: 2 mL via INTRAVENOUS

## 2015-09-21 MED ORDER — TECHNETIUM TC 99M SESTAMIBI GENERIC - CARDIOLITE
10.8000 | Freq: Once | INTRAVENOUS | Status: AC | PRN
  Administered 2015-09-21: 11 via INTRAVENOUS

## 2015-09-21 MED ORDER — REGADENOSON 0.4 MG/5ML IV SOLN
0.4000 mg | Freq: Once | INTRAVENOUS | Status: AC
  Administered 2015-09-21: 0.4 mg via INTRAVENOUS

## 2015-09-21 MED ORDER — TECHNETIUM TC 99M SESTAMIBI GENERIC - CARDIOLITE
32.5000 | Freq: Once | INTRAVENOUS | Status: AC | PRN
  Administered 2015-09-21: 33 via INTRAVENOUS

## 2015-09-25 LAB — MYOCARDIAL PERFUSION IMAGING
CHL CUP NUCLEAR SDS: 1
CHL CUP RESTING HR STRESS: 68 {beats}/min
CSEPPHR: 87 {beats}/min
LHR: 0.31
LV dias vol: 68 mL (ref 46–106)
LVSYSVOL: 34 mL
SRS: 7
SSS: 8
TID: 0.94

## 2016-07-04 ENCOUNTER — Ambulatory Visit (HOSPITAL_COMMUNITY): Payer: Medicare Other

## 2016-07-09 ENCOUNTER — Encounter: Payer: Medicare Other | Attending: Family Medicine | Admitting: Dietician

## 2016-07-09 DIAGNOSIS — E119 Type 2 diabetes mellitus without complications: Secondary | ICD-10-CM | POA: Diagnosis present

## 2016-07-09 DIAGNOSIS — Z713 Dietary counseling and surveillance: Secondary | ICD-10-CM | POA: Diagnosis present

## 2016-07-11 ENCOUNTER — Encounter: Payer: Self-pay | Admitting: Dietician

## 2016-07-11 NOTE — Progress Notes (Signed)
Patient was seen on 07/09/16 for the first of a series of three diabetes self-management courses at the [facility].  Patient Education Plan per assessed needs and concerns is to attend four course education program for Diabetes Self Management Education.  The following learning objectives were met by the patient during this class:  Describe diabetes  State some common risk factors for diabetes  Defines the role of glucose and insulin  Identifies type of diabetes and pathophysiology  Describe the relationship between diabetes and cardiovascular risk  State the members of the Healthcare Team  States the rationale for glucose monitoring  State when to test glucose  State their individual Target Range  State the importance of logging glucose readings  Describe how to interpret glucose readings  Identifies A1C target  Explain the correlation between A1c and eAG values  State symptoms and treatment of high blood glucose  State symptoms and treatment of low blood glucose  Explain proper technique for glucose testing  Identifies proper sharps disposal  Handouts given during class include:  Living Well with Diabetes book  Carb Counting and Meal Planning book  Meal Plan Card  Carbohydrate guide  Meal planning worksheet  Low Sodium Flavoring Tips  The diabetes portion plate  Y4M to eAG Conversion Chart  Diabetes Medications  Diabetes Recommended Care Schedule  Support Group  Diabetes Success Plan  Core Class Satisfaction Survey  Follow-Up Plan:  Attend core 2

## 2016-07-16 ENCOUNTER — Encounter: Payer: Medicare Other | Admitting: Dietician

## 2016-07-16 DIAGNOSIS — E119 Type 2 diabetes mellitus without complications: Secondary | ICD-10-CM

## 2016-07-16 DIAGNOSIS — Z713 Dietary counseling and surveillance: Secondary | ICD-10-CM | POA: Diagnosis not present

## 2016-07-16 NOTE — Progress Notes (Signed)

## 2016-07-18 ENCOUNTER — Encounter (HOSPITAL_COMMUNITY): Payer: Self-pay

## 2016-07-18 ENCOUNTER — Ambulatory Visit (HOSPITAL_COMMUNITY)
Admission: RE | Admit: 2016-07-18 | Discharge: 2016-07-18 | Disposition: A | Discharge status: Home / Self Care | Payer: Medicare Other | Source: Ambulatory Visit | Attending: Family Medicine | Admitting: Family Medicine

## 2016-07-18 DIAGNOSIS — M810 Age-related osteoporosis without current pathological fracture: Secondary | ICD-10-CM | POA: Insufficient documentation

## 2016-07-18 HISTORY — DX: Headache: R51

## 2016-07-18 HISTORY — DX: Headache, unspecified: R51.9

## 2016-07-18 MED ORDER — SODIUM CHLORIDE 0.9 % IV SOLN
Freq: Once | INTRAVENOUS | Status: AC
  Administered 2016-07-18: 14:00:00 via INTRAVENOUS

## 2016-07-18 MED ORDER — ZOLEDRONIC ACID 5 MG/100ML IV SOLN
5.0000 mg | Freq: Once | INTRAVENOUS | Status: AC
  Administered 2016-07-18: 5 mg via INTRAVENOUS
  Filled 2016-07-18: qty 100

## 2016-07-18 NOTE — Discharge Instructions (Signed)
Zoledronic Acid injection (Paget's Disease, Osteoporosis) / Reclast °What is this medicine? °ZOLEDRONIC ACID (ZOE le dron ik AS id) lowers the amount of calcium loss from bone. It is used to treat Paget's disease and osteoporosis in women. °COMMON BRAND NAME(S): Reclast, Zometa °What should I tell my health care provider before I take this medicine? °They need to know if you have any of these conditions: °-aspirin-sensitive asthma °-cancer, especially if you are receiving medicines used to treat cancer °-dental disease or wear dentures °-infection °-kidney disease °-low levels of calcium in the blood °-past surgery on the parathyroid gland or intestines °-receiving corticosteroids like dexamethasone or prednisone °-an unusual or allergic reaction to zoledronic acid, other medicines, foods, dyes, or preservatives °-pregnant or trying to get pregnant °-breast-feeding °How should I use this medicine? °This medicine is for infusion into a vein. It is given by a health care professional in a hospital or clinic setting. °Talk to your pediatrician regarding the use of this medicine in children. This medicine is not approved for use in children. °What if I miss a dose? °It is important not to miss your dose. Call your doctor or health care professional if you are unable to keep an appointment. °What may interact with this medicine? °-certain antibiotics given by injection °-NSAIDs, medicines for pain and inflammation, like ibuprofen or naproxen °-some diuretics like bumetanide, furosemide °-teriparatide °What should I watch for while using this medicine? °Visit your doctor or health care professional for regular checkups. It may be some time before you see the benefit from this medicine. Do not stop taking your medicine unless your doctor tells you to. Your doctor may order blood tests or other tests to see how you are doing. °Women should inform their doctor if they wish to become pregnant or think they might be pregnant.  There is a potential for serious side effects to an unborn child. Talk to your health care professional or pharmacist for more information. °You should make sure that you get enough calcium and vitamin D while you are taking this medicine. Discuss the foods you eat and the vitamins you take with your health care professional. °Some people who take this medicine have severe bone, joint, and/or muscle pain. This medicine may also increase your risk for jaw problems or a broken thigh bone. Tell your doctor right away if you have severe pain in your jaw, bones, joints, or muscles. Tell your doctor if you have any pain that does not go away or that gets worse. °Tell your dentist and dental surgeon that you are taking this medicine. You should not have major dental surgery while on this medicine. See your dentist to have a dental exam and fix any dental problems before starting this medicine. Take good care of your teeth while on this medicine. Make sure you see your dentist for regular follow-up appointments. °What side effects may I notice from receiving this medicine? °Side effects that you should report to your doctor or health care professional as soon as possible: °-allergic reactions like skin rash, itching or hives, swelling of the face, lips, or tongue °-anxiety, confusion, or depression °-breathing problems °-changes in vision °-eye pain °-feeling faint or lightheaded, falls °-jaw pain, especially after dental work °-mouth sores °-muscle cramps, stiffness, or weakness °-redness, blistering, peeling or loosening of the skin, including inside the mouth °-trouble passing urine or change in the amount of urine °Side effects that usually do not require medical attention (report to your doctor or health care   professional if they continue or are bothersome): °-bone, joint, or muscle pain °-constipation °-diarrhea °-fever °-hair loss °-irritation at site where injected °-loss of appetite °-nausea, vomiting °-stomach  upset °-trouble sleeping °-trouble swallowing °-weak or tired °Where should I keep my medicine? °This drug is given in a hospital or clinic and will not be stored at home. °© 2017 Elsevier/Gold Standard (2013-10-09 14:19:57) ° °

## 2016-07-23 ENCOUNTER — Encounter: Payer: Medicare Other | Admitting: Dietician

## 2016-07-23 DIAGNOSIS — E119 Type 2 diabetes mellitus without complications: Secondary | ICD-10-CM

## 2016-07-23 DIAGNOSIS — Z713 Dietary counseling and surveillance: Secondary | ICD-10-CM | POA: Diagnosis not present

## 2016-07-23 NOTE — Progress Notes (Signed)
Patient was seen on 07/23/16 for the third of a series of three diabetes self-management courses at the [facility].   Erika [name] the amount of activity recommended for healthy living . Describe activities suitable for individual needs . Identify ways to regularly incorporate activity into daily life . Identify barriers to activity and ways to over come these barriers  Identify diabetes medications being personally used and their primary action for lowering glucose and possible side effects . Describe role of stress on blood glucose and develop strategies to address psychosocial issues . Identify diabetes complications and ways to prevent them  Explain how to manage diabetes during illness . Evaluate success in meeting personal goal . Establish 2-3 goals that they will plan to diligently work on until they return for the  43-month follow-up visit  Goals:   I will be active 30 minutes or more 5 times a week  To help manage stress I will  exercise at least 5 times a week  Your patient has identified these potential barriers to change:  None  Your patient has identified their diabetes self-care support plan as  Family Support Plan:  Attend Support Group as desired

## 2016-10-01 ENCOUNTER — Other Ambulatory Visit: Payer: Self-pay | Admitting: Family Medicine

## 2016-10-01 ENCOUNTER — Ambulatory Visit
Admission: RE | Admit: 2016-10-01 | Discharge: 2016-10-01 | Disposition: A | Discharge status: Home / Self Care | Payer: Medicare Other | Source: Ambulatory Visit | Attending: Family Medicine | Admitting: Family Medicine

## 2016-10-01 DIAGNOSIS — R61 Generalized hyperhidrosis: Secondary | ICD-10-CM

## 2016-10-03 ENCOUNTER — Other Ambulatory Visit: Payer: Self-pay | Admitting: Family Medicine

## 2016-10-03 DIAGNOSIS — Z8571 Personal history of Hodgkin lymphoma: Secondary | ICD-10-CM

## 2016-10-03 DIAGNOSIS — R61 Generalized hyperhidrosis: Secondary | ICD-10-CM

## 2016-10-07 ENCOUNTER — Ambulatory Visit
Admission: RE | Admit: 2016-10-07 | Discharge: 2016-10-07 | Disposition: A | Discharge status: Home / Self Care | Payer: Medicare Other | Source: Ambulatory Visit | Attending: Family Medicine | Admitting: Family Medicine

## 2016-10-07 DIAGNOSIS — R61 Generalized hyperhidrosis: Secondary | ICD-10-CM

## 2016-10-07 DIAGNOSIS — Z8571 Personal history of Hodgkin lymphoma: Secondary | ICD-10-CM

## 2016-10-07 MED ORDER — IOPAMIDOL (ISOVUE-300) INJECTION 61%
100.0000 mL | Freq: Once | INTRAVENOUS | Status: AC | PRN
  Administered 2016-10-07: 100 mL via INTRAVENOUS

## 2016-10-08 ENCOUNTER — Other Ambulatory Visit: Payer: Self-pay | Admitting: Family Medicine

## 2016-10-08 DIAGNOSIS — N2889 Other specified disorders of kidney and ureter: Secondary | ICD-10-CM

## 2016-10-22 ENCOUNTER — Ambulatory Visit
Admission: RE | Admit: 2016-10-22 | Discharge: 2016-10-22 | Disposition: A | Discharge status: Home / Self Care | Payer: Medicare Other | Source: Ambulatory Visit | Attending: Family Medicine | Admitting: Family Medicine

## 2016-10-22 DIAGNOSIS — N2889 Other specified disorders of kidney and ureter: Secondary | ICD-10-CM

## 2016-10-24 ENCOUNTER — Other Ambulatory Visit: Payer: Self-pay | Admitting: Family Medicine

## 2016-10-24 DIAGNOSIS — N2889 Other specified disorders of kidney and ureter: Secondary | ICD-10-CM

## 2016-11-05 ENCOUNTER — Ambulatory Visit
Admission: RE | Admit: 2016-11-05 | Discharge: 2016-11-05 | Disposition: A | Discharge status: Home / Self Care | Payer: Medicare Other | Source: Ambulatory Visit | Attending: Family Medicine | Admitting: Family Medicine

## 2016-11-05 DIAGNOSIS — N2889 Other specified disorders of kidney and ureter: Secondary | ICD-10-CM

## 2016-11-05 MED ORDER — GADOBENATE DIMEGLUMINE 529 MG/ML IV SOLN
17.0000 mL | Freq: Once | INTRAVENOUS | Status: AC | PRN
  Administered 2016-11-05: 17 mL via INTRAVENOUS

## 2016-12-05 ENCOUNTER — Other Ambulatory Visit: Payer: Self-pay | Admitting: Oncology

## 2016-12-05 DIAGNOSIS — C8110 Nodular sclerosis classical Hodgkin lymphoma, unspecified site: Secondary | ICD-10-CM

## 2016-12-09 ENCOUNTER — Encounter: Payer: Self-pay | Admitting: Oncology

## 2016-12-09 ENCOUNTER — Telehealth: Payer: Self-pay | Admitting: Oncology

## 2016-12-09 NOTE — Telephone Encounter (Signed)
Appt has been scheduled for the pt to see Dr. Jana Hakim on 8/8 and labs on 8/1. Pt has agreed to both appts. Insurance and address verified.

## 2016-12-25 ENCOUNTER — Other Ambulatory Visit (HOSPITAL_BASED_OUTPATIENT_CLINIC_OR_DEPARTMENT_OTHER): Payer: Medicare Other

## 2016-12-25 DIAGNOSIS — C8110 Nodular sclerosis classical Hodgkin lymphoma, unspecified site: Secondary | ICD-10-CM | POA: Diagnosis not present

## 2016-12-25 LAB — CBC WITH DIFFERENTIAL/PLATELET
BASO%: 0.3 % (ref 0.0–2.0)
Basophils Absolute: 0 10*3/uL (ref 0.0–0.1)
EOS ABS: 0.2 10*3/uL (ref 0.0–0.5)
EOS%: 2.5 % (ref 0.0–7.0)
HCT: 41.2 % (ref 34.8–46.6)
HEMOGLOBIN: 13.4 g/dL (ref 11.6–15.9)
LYMPH#: 1.9 10*3/uL (ref 0.9–3.3)
LYMPH%: 20.3 % (ref 14.0–49.7)
MCH: 27.9 pg (ref 25.1–34.0)
MCHC: 32.5 g/dL (ref 31.5–36.0)
MCV: 85.7 fL (ref 79.5–101.0)
MONO#: 0.9 10*3/uL (ref 0.1–0.9)
MONO%: 9.3 % (ref 0.0–14.0)
NEUT%: 67.6 % (ref 38.4–76.8)
NEUTROS ABS: 6.4 10*3/uL (ref 1.5–6.5)
PLATELETS: 288 10*3/uL (ref 145–400)
RBC: 4.81 10*6/uL (ref 3.70–5.45)
RDW: 16.3 % — AB (ref 11.2–14.5)
WBC: 9.4 10*3/uL (ref 3.9–10.3)

## 2016-12-25 LAB — COMPREHENSIVE METABOLIC PANEL WITH GFR
ALT: 23 U/L (ref 0–55)
AST: 18 U/L (ref 5–34)
Albumin: 3.4 g/dL — ABNORMAL LOW (ref 3.5–5.0)
Alkaline Phosphatase: 99 U/L (ref 40–150)
Anion Gap: 9 meq/L (ref 3–11)
BUN: 8.6 mg/dL (ref 7.0–26.0)
CO2: 24 meq/L (ref 22–29)
Calcium: 9 mg/dL (ref 8.4–10.4)
Chloride: 106 meq/L (ref 98–109)
Creatinine: 0.7 mg/dL (ref 0.6–1.1)
EGFR: 82 mL/min/{1.73_m2} — ABNORMAL LOW
Glucose: 125 mg/dL (ref 70–140)
Potassium: 3.8 meq/L (ref 3.5–5.1)
Sodium: 139 meq/L (ref 136–145)
Total Bilirubin: 0.25 mg/dL (ref 0.20–1.20)
Total Protein: 6.8 g/dL (ref 6.4–8.3)

## 2016-12-25 LAB — LACTATE DEHYDROGENASE: LDH: 100 U/L — ABNORMAL LOW (ref 125–245)

## 2016-12-28 ENCOUNTER — Ambulatory Visit (HOSPITAL_COMMUNITY)
Admission: RE | Admit: 2016-12-28 | Discharge: 2016-12-28 | Disposition: A | Discharge status: Home / Self Care | Payer: Medicare Other | Source: Ambulatory Visit | Attending: Oncology | Admitting: Oncology

## 2016-12-28 DIAGNOSIS — C8110 Nodular sclerosis classical Hodgkin lymphoma, unspecified site: Secondary | ICD-10-CM | POA: Diagnosis not present

## 2016-12-28 LAB — GLUCOSE, CAPILLARY: Glucose-Capillary: 107 mg/dL — ABNORMAL HIGH (ref 65–99)

## 2016-12-28 MED ORDER — FLUDEOXYGLUCOSE F - 18 (FDG) INJECTION
9.1900 | Freq: Once | INTRAVENOUS | Status: AC | PRN
  Administered 2016-12-28: 9.19 via INTRAVENOUS

## 2016-12-29 ENCOUNTER — Other Ambulatory Visit: Payer: Self-pay | Admitting: Oncology

## 2017-01-01 ENCOUNTER — Ambulatory Visit (HOSPITAL_BASED_OUTPATIENT_CLINIC_OR_DEPARTMENT_OTHER): Payer: Medicare Other | Admitting: Oncology

## 2017-01-01 VITALS — BP 138/56 | HR 88 | Temp 98.2°F | Resp 18 | Ht 59.0 in | Wt 191.4 lb

## 2017-01-01 DIAGNOSIS — Z8571 Personal history of Hodgkin lymphoma: Secondary | ICD-10-CM | POA: Diagnosis not present

## 2017-01-01 NOTE — Progress Notes (Signed)
ID: Erika [name]   DOB: Oct 18, 1945  MR#: [id number]  DVV#:[id number]  PCP: Particia Jasper White GYN: SU: OTHER MD: Letta Moynahan, Scott McDiarmid, Gaynelle Arabian, Vara Guardian, Bronson Ing  CHIEF COMPLAINT: History of Hodgkin's disease  CURRENT THERAPY: Observation   INTERVAL HISTORY: Erika [name] returns today for follow-up and evaluation of Hodgkin's lymphoma. We had released her from follow-up here, but she developed some night sweats racing concerns regarding disease recurrence. This was evaluated with a chest x-ray 10/01/2016 which showed no obvious adenopathy. There was some scarring at the left lateral base of the lung. On 10/07/2016 she had CT scans of the chest abdomen and pelvis. This showed stable bilateral axillary adenopathy, stable areas of splenic hypoattenuation, a mass along the lower pole of the right kidney felt to be most likely a cyst but not seen in prior CTs or PET scan's, and a mildly enlarged left inguinal lymph node.  On 11/05/2016 she underwent abdominal MRI which showed no suspicious renal lesions. There were multiple parapelvic cysts associated with both kidneys. There was significant hepatic steatosis.  While this was reassuring and was not felt to be conclusive and she was referred here for further evaluation.  REVIEW OF SYSTEMS: Erika [name] does a lot of reading and knitting but no exercise at all. She is very concerned about her husband, now 54, who has significant neurogenic orthostatic hypotension and requires her constant care. She has a feeling of numbness on her tongue. She has developed multiple skin tags. She denies recurrent fevers. The night sweats are still occasionally present but there are not drenching there has been no rash or pruritus and she is not aware of any adenopathy. A detailed review of systems today was otherwise stable  HISTORY OF HODGKIN'S LYMPHOMA From the original 2010 intake note:  Erika [name] had been feeling tired for a year or  two, that being more tired than would be normal she feels.  She has also lost approximately 60 pounds in the last two years.  She interpreted that as "a gift from God", and was just delighted, but then more recently, in addition to the weight loss and fatigue, she started having nausea and vomiting.  She was also having drenching night sweats, which she interprets, possibly accurately, as hot flashes, but possibly they could be due to the tumor.  In any case, she saw Dr. Dema Severin for this, and Dr. Dema Severin thought she really deserved admission for further evaluation.  This was done on June 13, 2008.  Studies that admission included an admission CBC - white cells 20.2, hemoglobin 11.6, and platelets 123,000.  Coags were normal, and CMP was normal except for an alkaline phosphatase of 215, and an ALT of 37.  Calcium was 9.0, albumin 2.4, creatinine 0.60.  LDH was 125, and the ionized calcium was normal at 1.2.  Anemia panel showed a reticulocyte count of 65, a B12 of 434, a folate of 9 and a ferritin of 261.  ESR was 102, CA-125, CEA and CA19 were all normal, as was urine immunofixation and serum protein electrophoresis and immunofixation.  Her total IgA was 637, which is elevated. Her total IgG was 1280, and her total IgM was 284, which is minimally elevated.  Multiple cultures including blood x two, bronchial washings for AFB and urine were negative, or in the case of urine, non-informative.    On June 17, 2008 she underwent bronchoscopy with the pathology (C10-127, C10-126, and C10-119) showing a few atypical cells, but basically being nondiagnostic.  During this admission the patient had a CT of the chest, which showed scattered bilateral scarring and left lower lobe atelectasis, as well as some hilar and subcarinal adenopathy.  There was a 3 cm hepatic lobe lesion in the right hepatic lobe, and a second smaller lesion in the same area.  The spleen appeared normal.  There were multiple retroperitoneal surgical  clips from her prior surgery for ovarian cancer, and multiple mildly enlarged retroperitoneal lymph nodes.  The pelvis was status post hysterectomy, but no significant adenopathy, and the largest nodal mass measures 4.9 x 2.8 cm in the subcarinal area.  There were also multiple abnormal axillary lymph nodes noted, and there were multiple bilateral sub-centimeter pulmonary nodules predominant in the upper lobes, the largest measuring 7 mm.    On June 19, 2008, the patient had an upper endoscopy by Dr. Michail Sermon, which showed mild gastritis, small hiatal hernia and a mildly tortuous esophagus, but no stricture or malignancy.  On July 01, 2008, the patient has ultrasound-guided biopsy of a right supraclavicular lymph node, which showed (S10-662 and C10-212) predominantly blood and fibrous adipose tissue.    Finally, on July 11, 2008, the patient underwent excision of a deep left axillary lymph node under Dr. 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  The flow cytometry showed no monoclonal B-cell population or abnormal T-cell phenotype, and the cells in question were CD30, CD15 positive, and negative for CD45, CD3 and other markers.    The patient was further staged with a PET scan obtained June 29, 2008.  This showed extensive hypermetabolic lymphadenopathy in the soft tissues of the neck, axilla, mediastinum, retroperitoneum and small bowel mesentery.  There was a hypermetabolic left upper lobe nodule measuring 7.3 mm with an SUV of 6.3. There was a large focus of intense involvement in the thoracic spine, suggestive of bone involvement.  The right hepatic lobe 2.9 cm lesion had an SUV of 35.6.  There were at least two foci of mildly increased FDG uptake in the spleen as well.    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  PAST MEDICAL HISTORY: Past Medical History:  Diagnosis Date  . Asthma   . Diabetes mellitus without complication ([place])   . Headache   . hodgkins lymphoma dx'd 07/14/09 & 05/2009   chemo comp; bone marrow transplant  . OSA (obstructive sleep apnea)    not using CPAP- unable to tolerate  Depression.  She has a history of migraines, although these are rare.  She has a history of asthma, which is treated by Dr. Dema Severin, a history of tobacco abuse, the patient quitting in 1992, smoking about one-half pack per day for perhaps 30 years.  There is a history of ovarian cancer, and she underwent TAH/BSO in 1999.  I do not have those records.  She also had at least partial omentectomy by her account.  This was done under Dr. Lemont Fillers and Dr. Marti Sleigh.  The patient was told that she did not need any adjuvant treatment, and indeed she has been disease free now for more than ten years.  She has a small hiatal hernia.  She has significant osteoarthritis involving the knees, which makes it difficult for her to ambulate.  She has a history of hypercholesterolemia, and she has a history  of stasis dermatitis involving the left ankle.    PAST SURGICAL HISTORY: Past Surgical History:  Procedure Laterality Date  . BONE MARROW TRANSPLANT  2011  . CHOLECYSTECTOMY  1966  . VESICOVAGINAL FISTULA CLOSURE W/ TAH  1999   FAMILY HISTORY Family History  Problem Relation Age of Onset  . Heart disease Father   The patient's father died at the age of 34 from congestive heart failure.  The patient's mother died at the age of 64 with Alzheimer's disease.  The patient has one brother and one sister, but there is no other cancer in the family.   GYNECOLOGIC HISTORY: She is GX P1, first pregnancy to term at age 12.  After her hysterectomy and salpingo oophorectomy she started estrogen replacement on which she continues.  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  Her husband, [name], was a Freight forwarder for [employer].  The patient has a son from an earlier marriage, Dellis Filbert, who lives in [place], [place], and works in IT for a hospital there.  [name] has four children of his own all in this area.  There are a total of 14 grandchildren and 12 great-grandchildren.  The patient attends [employer], and is very active in that community.     HEALTH MAINTENANCE: Social History  Substance Use Topics  . Smoking status: Former Smoker    Packs/day: 0.25    Years: 10.00    Types: Cigarettes    Quit date: 05/27/1992  . Smokeless tobacco: Never Used  . Alcohol use Yes     Comment: socially     Colonoscopy:  PAP:  Bone density:  Lipid panel:  Allergies  Allergen Reactions  . Cephalexin   . Sudafed [Pseudoephedrine Hcl] Itching and Swelling    Current Outpatient Prescriptions  Medication Sig Dispense Refill  . ADVAIR DISKUS 100-50 MCG/DOSE AEPB     . ARIPiprazole (ABILIFY) 5 MG tablet Take 5 mg by mouth daily.    Marland Kitchen azelastine (ASTELIN) 0.1 % nasal spray Place into both nostrils 2 (two) times daily. Use in each nostril as directed    . Calcium Carbonate-Vit D-Min (CALTRATE PLUS PO) Take 2 tablets by mouth 2 (two) times daily.     . Cetirizine HCl (ZYRTEC ALLERGY PO) Take by mouth.    . clonazePAM (KLONOPIN) 0.5 MG tablet Take 0.5 mg by mouth 2 (two) times daily as needed for anxiety.    Marland Kitchen escitalopram (LEXAPRO) 10 MG tablet     . escitalopram (LEXAPRO) 20 MG tablet Take 10 mg by mouth daily.     Marland Kitchen esomeprazole (NEXIUM) 40 MG capsule Take 30-60 min before first meal of the day    . famotidine (PEPCID) 20 MG tablet One at bedtime (Patient taking differently: Take 20 mg by mouth at bedtime. One at bedtime) 30 tablet 2  . fluticasone (FLONASE) 50 MCG/ACT nasal spray     . isometheptene-acetaminophen-dichloralphenazone (MIDRIN) 65-100-325 MG capsule Take 1 capsule by mouth 4 (four) times daily as  needed for migraine. Maximum 5 capsules in 12 hours for migraine headaches, 8 capsules in 24 hours for tension headaches.    . levothyroxine (SYNTHROID, LEVOTHROID) 75 MCG tablet     . mirabegron ER (MYRBETRIQ) 50 MG TB24 tablet Take 50 mg by mouth daily.    . montelukast (SINGULAIR) 10 MG tablet Take 10 mg by mouth at bedtime.    . multivitamin-iron-minerals-folic acid (CENTRUM) chewable tablet Chew 1 tablet by mouth daily.    Marland Kitchen  pramipexole (MIRAPEX) 0.25 MG tablet     . UNABLE TO FIND Med Name: 388 (nyst/taccr,aqua) cream as needed     No current facility-administered medications for this visit.     OBJECTIVE: Morbidly obese white woman who appears stated age  Note that EPIC was down at the time of this visit. Vital signs will be separately entered  There were no vitals filed for this visit.   There is no height or weight on file to calculate BMI.    ECOG FS: 1 There were no vitals filed for this visit.   Sclerae unicteric, pupils round and equal Oropharynx clear and moist No cervical or supraclavicular adenopathy, no cervical or inguinal adenopathy Lungs no rales or rhonchi Heart regular rate and rhythm Abd soft, obese nontender, positive bowel sounds MSK no focal spinal tenderness, no upper extremity lymphedema Neuro: nonfocal, well oriented, appropriate affect Breasts: Deferred   LAB RESULTS: Lab Results  Component Value Date   WBC 9.4 12/25/2016   NEUTROABS 6.4 12/25/2016   HGB 13.4 12/25/2016   HCT 41.2 12/25/2016   MCV 85.7 12/25/2016   PLT 288 12/25/2016      Chemistry      Component Value Date/Time   NA 139 12/25/2016 1107   K 3.8 12/25/2016 1107   CL 106 12/18/2014 1710   CL 105 06/15/2012 1257   CO2 24 12/25/2016 1107   BUN 8.6 12/25/2016 1107   CREATININE 0.7 12/25/2016 1107      Component Value Date/Time   CALCIUM 9.0 12/25/2016 1107   ALKPHOS 99 12/25/2016 1107   AST 18 12/25/2016 1107   ALT 23 12/25/2016 1107   BILITOT 0.25 12/25/2016 1107       STUDIES: Nm Pet Image Restag (ps) Skull Base To Thigh  Result Date: 12/28/2016 CLINICAL DATA:  Subsequent treatment strategy for non-Hodgkin's lymphoma. EXAM: NUCLEAR MEDICINE PET SKULL BASE TO THIGH TECHNIQUE: 9.1 mCi F-18 FDG was injected intravenously. Full-ring PET imaging was performed from the skull base to thigh after the radiotracer. CT data was obtained and used for attenuation correction and anatomic localization. FASTING BLOOD GLUCOSE:  Value: 106 mg/dl COMPARISON:  PET-CT 03/13/2015 FINDINGS: NECK No hypermetabolic lymph nodes in the neck. CHEST No hypermetabolic mediastinal or hilar nodes. No suspicious pulmonary nodules on the CT scan. ABDOMEN/PELVIS No abnormal metabolic activity in spleen. Normal volume spleen. No hypermetabolic abdominopelvic lymph nodes. Mild activity associated inguinal lymph nodes are normal size is favored reactive (metabolic activity activity is less than liver). No abnormal activity in the liver, pancreas kidneys. Atherosclerotic calcification of the aorta. Diverticulosis of the LEFT colon. No acute findings Post hysterectomy. SKELETON No focal hypermetabolic activity to suggest skeletal metastasis. Multiple levels of vertebroplasty augmentation. IMPRESSION: No evidence of lymphoma recurrence on skullbase to thigh FDG PET-CT scan Electronically Signed   By: Suzy Bouchard M.D.   On: 12/28/2016 13:44     ASSESSMENT: 71 y.o. 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 Chemotherapy was completed August of 2010.   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  (3) hypermetabolic lesion in the spleen noted by PET scan 10/19/2013  (a) "triple  vaccine" given 10/26/2013  PLAN: I spent a little over  30 minutes with Jazira going over her situation. We reviewed her prior scans which left some questions unanswered, but the PET scan was more definitive and shows no evidence of disease recurrence. This is of course very good news. It means she is now 7 years out from her most recent treatment for her recurrent Hodgkin's disease without evidence of disease activity. She is very likely cured.  She has multiple issues related to her morbid obesity. This is not something she wishes to address at this point.  We discussed the strange feeling of numbness on her tongue and she is going to try some B-12 supplementation to see if that clears.  Otherwise I feel comfortable releasing her back to Dr. Orest Dikes care. I will be glad to see her again at any point in the future if any questions regarding disease recurrence or new cancer develop    Janayia Burggraf C    01/01/2017

## 2017-03-04 ENCOUNTER — Ambulatory Visit
Admission: RE | Admit: 2017-03-04 | Discharge: 2017-03-04 | Disposition: A | Discharge status: Home / Self Care | Payer: Medicare Other | Source: Ambulatory Visit | Attending: Family Medicine | Admitting: Family Medicine

## 2017-03-04 ENCOUNTER — Other Ambulatory Visit: Payer: Self-pay | Admitting: Family Medicine

## 2017-03-04 DIAGNOSIS — M5442 Lumbago with sciatica, left side: Secondary | ICD-10-CM

## 2017-03-04 DIAGNOSIS — M25552 Pain in left hip: Secondary | ICD-10-CM

## 2017-03-28 ENCOUNTER — Other Ambulatory Visit: Payer: Self-pay | Admitting: Family Medicine

## 2017-03-28 DIAGNOSIS — M5432 Sciatica, left side: Secondary | ICD-10-CM

## 2017-03-31 ENCOUNTER — Other Ambulatory Visit: Payer: Self-pay | Admitting: Obstetrics and Gynecology

## 2017-04-09 ENCOUNTER — Ambulatory Visit
Admission: RE | Admit: 2017-04-09 | Discharge: 2017-04-09 | Disposition: A | Discharge status: Home / Self Care | Payer: Medicare Other | Source: Ambulatory Visit | Attending: Family Medicine | Admitting: Family Medicine

## 2017-04-09 DIAGNOSIS — M5432 Sciatica, left side: Secondary | ICD-10-CM

## 2017-07-21 ENCOUNTER — Ambulatory Visit (INDEPENDENT_AMBULATORY_CARE_PROVIDER_SITE_OTHER)
Admission: RE | Admit: 2017-07-21 | Discharge: 2017-07-21 | Disposition: A | Discharge status: Home / Self Care | Payer: Medicare Other | Source: Ambulatory Visit | Attending: Pulmonary Disease | Admitting: Pulmonary Disease

## 2017-07-21 ENCOUNTER — Ambulatory Visit (INDEPENDENT_AMBULATORY_CARE_PROVIDER_SITE_OTHER): Payer: Medicare Other | Admitting: Pulmonary Disease

## 2017-07-21 ENCOUNTER — Other Ambulatory Visit (INDEPENDENT_AMBULATORY_CARE_PROVIDER_SITE_OTHER): Payer: Medicare Other

## 2017-07-21 ENCOUNTER — Encounter: Payer: Self-pay | Admitting: Pulmonary Disease

## 2017-07-21 VITALS — BP 122/80 | HR 85 | Ht 59.0 in | Wt 195.0 lb

## 2017-07-21 DIAGNOSIS — J45909 Unspecified asthma, uncomplicated: Secondary | ICD-10-CM | POA: Diagnosis not present

## 2017-07-21 DIAGNOSIS — R0602 Shortness of breath: Secondary | ICD-10-CM

## 2017-07-21 LAB — NITRIC OXIDE: NITRIC OXIDE: 17

## 2017-07-21 LAB — CBC WITH DIFFERENTIAL/PLATELET
BASOS ABS: 0.1 10*3/uL (ref 0.0–0.1)
Basophils Relative: 0.4 % (ref 0.0–3.0)
Eosinophils Absolute: 0.3 10*3/uL (ref 0.0–0.7)
Eosinophils Relative: 2.1 % (ref 0.0–5.0)
HEMATOCRIT: 43.8 % (ref 36.0–46.0)
Hemoglobin: 14.3 g/dL (ref 12.0–15.0)
LYMPHS ABS: 1.6 10*3/uL (ref 0.7–4.0)
LYMPHS PCT: 12.6 % (ref 12.0–46.0)
MCHC: 32.7 g/dL (ref 30.0–36.0)
MCV: 85.3 fl (ref 78.0–100.0)
Monocytes Absolute: 0.8 10*3/uL (ref 0.1–1.0)
Monocytes Relative: 6.1 % (ref 3.0–12.0)
NEUTROS ABS: 10 10*3/uL — AB (ref 1.4–7.7)
Neutrophils Relative %: 78.8 % — ABNORMAL HIGH (ref 43.0–77.0)
PLATELETS: 331 10*3/uL (ref 150.0–400.0)
RBC: 5.14 Mil/uL — AB (ref 3.87–5.11)
RDW: 15.5 % (ref 11.5–15.5)
WBC: 12.7 10*3/uL — ABNORMAL HIGH (ref 4.0–10.5)

## 2017-07-21 NOTE — Patient Instructions (Signed)
We will get a chest x-ray today, CBC with differential and a blood allergy profile Your asthma appears stable today. We will schedule you for a cardiopulmonary exercise test to evaluate the reason for her shortness of breath..  Please check with the schedulers about any co-pay that may be involved.  Continue to work on exercise at home, weight loss which will help with your symptoms. Follow-up in 1-2 months.

## 2017-07-21 NOTE — Addendum Note (Signed)
Addended by: Maryanna Shape A on: 07/21/2017 03:52 PM   Modules accepted: Orders

## 2017-07-21 NOTE — Progress Notes (Addendum)
Erika [name]    [id number]    18-Aug-1945  Primary Care Physician:White, Caren Griffins, MD  Referring Physician: Harlan Stains, MD [address] [place], [place] [address]  Chief complaint: Consult for dyspnea  HPI: 72 year old with history of lymphoma, asthma Diagnosed with lymphoma in 2010 and underwent chemotherapy, bone marrow transplant at [facility].  She is followed by Dr. Jana Hakim with no evidence of recurrence.  Followed by Dr. Melvyn Novas in 2015 for asthma.  PFTs in past do not show any obstruction  Referred back for chronic dyspnea on exertion.  She denies any symptoms at rest.  Denies any cough, sputum production, wheezing.  There are no seasonal allergies, acid reflux.  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  2-3 pack years.  Quit in 1993 Travel History: Grew up in [place].  She lived all over the [place] and [place].  Moved to [place] in 1993.  No recent travel  Outpatient Encounter Medications as of 07/21/2017  Medication Sig  . ADVAIR DISKUS 100-50 MCG/DOSE AEPB   . azelastine (ASTELIN) 0.1 % nasal spray Place into both nostrils 2 (two) times daily. Use in each nostril as directed  . Calcium Carbonate-Vit D-Min (CALTRATE PLUS PO) Take 2 tablets by mouth 2 (two) times daily.   . Cetirizine HCl (ZYRTEC ALLERGY PO) Take by mouth.  . clonazePAM (KLONOPIN) 0.5 MG tablet Take 0.5 mg by mouth 2 (two) times daily as needed for anxiety.  Marland Kitchen escitalopram (LEXAPRO) 20 MG tablet Take 10 mg by mouth daily.   Marland Kitchen esomeprazole (NEXIUM) 40 MG capsule Take 30-60 min before first meal of the day  . famotidine (PEPCID) 20 MG tablet One at bedtime (Patient taking differently: Take 20 mg by mouth at bedtime. One at bedtime)  . fluticasone (FLONASE) 50 MCG/ACT nasal spray   . levothyroxine (SYNTHROID, LEVOTHROID) 75 MCG  tablet   . metFORMIN (GLUCOPHAGE-XR) 500 MG 24 hr tablet TAKE 1 TABLET BY MOUTH EVERY DAY WITH EVENING MEAL  . mirabegron ER (MYRBETRIQ) 50 MG TB24 tablet Take 50 mg by mouth daily.  . montelukast (SINGULAIR) 10 MG tablet Take 10 mg by mouth at bedtime.  . multivitamin-iron-minerals-folic acid (CENTRUM) chewable tablet Chew 1 tablet by mouth daily.  . pramipexole (MIRAPEX) 0.25 MG tablet   . UNABLE TO FIND Med Name: 191 (nyst/taccr,aqua) cream as needed  . [DISCONTINUED] ARIPiprazole (ABILIFY) 5 MG tablet Take 5 mg by mouth daily.  . [DISCONTINUED] escitalopram (LEXAPRO) 10 MG tablet   . [DISCONTINUED] isometheptene-acetaminophen-dichloralphenazone (MIDRIN) 65-100-325 MG capsule Take 1 capsule by mouth 4 (four) times daily as needed for migraine. Maximum 5 capsules in 12 hours for migraine headaches, 8 capsules in 24 hours for tension headaches.  Marland Kitchen SYNTHROID 100 MCG tablet    No facility-administered encounter medications on file as of 07/21/2017.     Allergies as of 07/21/2017 - Review Complete 07/21/2017  Allergen Reaction Noted  . Cephalexin  06/10/2011  . Sudafed [pseudoephedrine hcl] Itching and Swelling 04/02/2013    Past Medical History:  Diagnosis Date  . Asthma   . Diabetes mellitus without complication ([place])   . Headache   . hodgkins lymphoma dx'd 07/14/09 & 05/2009   chemo comp; bone marrow transplant  . OSA (obstructive sleep apnea)    not using CPAP- unable to tolerate    Past Surgical  History:  Procedure Laterality Date  . BONE MARROW TRANSPLANT  2011  . CHOLECYSTECTOMY  1966  . VESICOVAGINAL FISTULA CLOSURE W/ TAH  1999    Family History  Problem Relation Age of Onset  . Heart disease Father     Social History   Socioeconomic History  . Marital status: Married    Spouse name: Not on file  . Number of children: Not on file  . Years of education: Not on file  . Highest education level: Not on file  Social Needs  . Financial resource strain: Not on file    . Food insecurity - worry: Not on file  . Food insecurity - inability: Not on file  . Transportation needs - medical: Not on file  . Transportation needs - non-medical: Not on file  Occupational History  . Occupation: Retired    Fish farm manager: RETIRED    CommentHealth and safety inspector  Tobacco Use  . Smoking status: Former Smoker    Packs/day: 0.25    Years: 10.00    Pack years: 2.50    Types: Cigarettes    Last attempt to quit: 05/27/1992    Years since quitting: 25.1  . Smokeless tobacco: Never Used  Substance and Sexual Activity  . Alcohol use: Yes    Comment: socially  . Drug use: No  . Sexual activity: Not on file  Other Topics Concern  . Not on file  Social History Narrative  . Not on file    Review of systems: Review of Systems  Constitutional: Negative for fever and chills.  HENT: Negative.   Eyes: Negative for blurred vision.  Respiratory: as per HPI  Cardiovascular: Negative for chest pain and palpitations.  Gastrointestinal: Negative for vomiting, diarrhea, blood per rectum. Genitourinary: Negative for dysuria, urgency, frequency and hematuria.  Musculoskeletal: Negative for myalgias, back pain and joint pain.  Skin: Negative for itching and rash.  Neurological: Negative for dizziness, tremors, focal weakness, seizures and loss of consciousness.  Endo/Heme/Allergies: Negative for environmental allergies.  Psychiatric/Behavioral: Negative for depression, suicidal ideas and hallucinations.  All other systems reviewed and are negative.  Physical Exam: Blood pressure 122/80, pulse 85, height 4\' 11"  (1.499 m), weight 195 lb (88.5 kg), SpO2 95 %. Gen:      No acute distress HEENT:  EOMI, sclera anicteric Neck:     No masses; no thyromegaly Lungs:    Clear to auscultation bilaterally; normal respiratory effort CV:         Regular rate and rhythm; no murmurs Abd:      + bowel sounds; soft, non-tender; no palpable masses, no distension Ext:    No edema; adequate  peripheral perfusion Skin:      Warm and dry; no rash Neuro: alert and oriented x 3 Psych: normal mood and affect  Data Reviewed: FENO 07/21/17-17  CBC 12/25/16-WBC 9.4, eos 2.5%, absolute eosinophil count 200  Imaging CT chest 10/07/16-clear lungs, chronic left pleural thickening CT chest 09/21/10- borderline enlarged right hilar node. PET scan 09/21/10-no abnormal uptake.  Right hilar node is not FDG avid. I have reviewed the images personally.  PFTs 03/12/14 FVC 1.57 [7%], FEV1 1.37 [98%], F/F 87, TLC 67%, DLCO 80% Moderate restriction.  No obstruction  Assessment:  Mild persistent asthma Her symptoms appear stable with no evidence of exacerbation.  FENO is low We discussed repeating PFTs but she would like to avoid for now.  I will check chest x-ray today, CBC differential and blood allergy profile   Suspect  that her dyspnea may be related to her weight and deconditioning.  She admits that she does not exert herself at all.  I have encouraged her to exercise at home and work on diet.  Schedule cardiopulmonary exercise test for further evaluation of dyspnea.   Plan/Recommendations: - Chest x-ray, CBC differential, blood allergy profile - Continue Advair - Cardiopulmonary exercise test  Marshell Garfinkel MD [facility] Pager [phone] 07/21/2017, 10:49 AM  CC: Harlan Stains, MD

## 2017-07-22 ENCOUNTER — Other Ambulatory Visit: Payer: Self-pay

## 2017-07-22 DIAGNOSIS — R0602 Shortness of breath: Secondary | ICD-10-CM

## 2017-07-23 LAB — INTERPRETATION:

## 2017-07-23 LAB — RESPIRATORY ALLERGY PROFILE REGION II ~~LOC~~
ALLERGEN, A. ALTERNATA, M6: 7.48 kU/L — AB
ALLERGEN, D PTERNOYSSINUS, D1: 20.5 kU/L — AB
ALLERGEN, P. NOTATUM, M1: 4.76 kU/L — AB
Allergen, Cedar tree, t12: 0.13 kU/L — ABNORMAL HIGH
Allergen, Mouse Urine Protein, e78: <0.1 kU/L
Allergen, Mulberry, t76: 0.1 kU/L — ABNORMAL HIGH
Allergen, Oak,t7: <0.1 kU/L
Aspergillus fumigatus, m3: 2.96 kU/L — ABNORMAL HIGH
CLADOSPORIUM HERBARUM (M2) IGE: 0.16 kU/L — ABNORMAL HIGH
CLASS: 0
CLASS: 0
CLASS: 0
CLASS: 0
CLASS: 0
CLASS: 0
CLASS: 0
CLASS: 0
CLASS: 3
COMMON RAGWEED (SHORT) (W1) IGE: <0.1 kU/L
Cat Dander: 6.83 kU/L — ABNORMAL HIGH
Class: 0
Class: 0
Class: 0
Class: 0
Class: 0
Class: 0
Class: 0
Class: 0
Class: 0
Class: 2
Class: 3
Class: 3
Class: 3
Class: 4
Class: 4
Cockroach: <0.1 kU/L
D. FARINAE: 34 kU/L — AB
DOG DANDER: 11.6 kU/L — AB
ELM IGE: 0.11 kU/L — AB
IGE (IMMUNOGLOBULIN E), SERUM: 1100 kU/L — AB (ref <=114)
Rough Pigweed  IgE: 0.13 kU/L — ABNORMAL HIGH
Timothy Grass: <0.1 kU/L

## 2017-07-29 ENCOUNTER — Ambulatory Visit (INDEPENDENT_AMBULATORY_CARE_PROVIDER_SITE_OTHER)
Admission: RE | Admit: 2017-07-29 | Discharge: 2017-07-29 | Disposition: A | Discharge status: Home / Self Care | Payer: Medicare Other | Source: Ambulatory Visit | Attending: Pulmonary Disease | Admitting: Pulmonary Disease

## 2017-07-29 DIAGNOSIS — R0602 Shortness of breath: Secondary | ICD-10-CM

## 2017-07-31 ENCOUNTER — Ambulatory Visit (HOSPITAL_COMMUNITY): Payer: Medicare Other | Attending: Family Medicine

## 2017-07-31 ENCOUNTER — Other Ambulatory Visit (HOSPITAL_COMMUNITY): Payer: Self-pay | Admitting: *Deleted

## 2017-07-31 DIAGNOSIS — R0602 Shortness of breath: Secondary | ICD-10-CM

## 2017-07-31 DIAGNOSIS — R0600 Dyspnea, unspecified: Secondary | ICD-10-CM | POA: Diagnosis not present

## 2017-08-13 ENCOUNTER — Telehealth: Payer: Self-pay | Admitting: Pulmonary Disease

## 2017-08-13 DIAGNOSIS — R918 Other nonspecific abnormal finding of lung field: Secondary | ICD-10-CM

## 2017-08-13 NOTE — Progress Notes (Signed)
Attempted to call patient, She was not home left a message for her to call with her husband

## 2017-08-13 NOTE — Telephone Encounter (Signed)
Notes recorded by Marshell Garfinkel, MD on 08/07/2017 at 12:39 AM EDT CT shows evidence of asthma, mild bronchitis and minimal scarring There is a lung nodule which needs follow-up. Please order CT chest without contrast in 6 months.  This evidence of coronary atherosclerosis. Please have her follow-up with her primary care regarding this.   Notes recorded by Marshell Garfinkel, MD on 08/07/2017 at 12:44 AM EDT CPET shows low normal function. Looks like the dyspnea is mostly due to body weight and deconditioning. Recommend exercise program and weight loss like we discussed in clinic. I will review further at time of next clinic visit. ----------------- lmtcb for pt to review CPST and CT. Follow up CT chest ordered.

## 2017-08-14 NOTE — Telephone Encounter (Signed)
lmtcb x2 for pt. 

## 2017-08-15 NOTE — Telephone Encounter (Signed)
Spoke with pt, aware of results/recs.  Follow up CT chest ordered.  Nothing further needed.

## 2017-08-18 ENCOUNTER — Ambulatory Visit (INDEPENDENT_AMBULATORY_CARE_PROVIDER_SITE_OTHER): Payer: Medicare Other | Admitting: Pulmonary Disease

## 2017-08-18 ENCOUNTER — Encounter: Payer: Self-pay | Admitting: Pulmonary Disease

## 2017-08-18 VITALS — BP 142/84 | HR 85 | Ht 59.0 in | Wt 197.0 lb

## 2017-08-18 DIAGNOSIS — J4530 Mild persistent asthma, uncomplicated: Secondary | ICD-10-CM

## 2017-08-18 NOTE — Progress Notes (Addendum)
Erika [name]    [id number]    [birth year]  Primary Care Physician:White, Caren Griffins, MD  Referring Physician: Harlan Stains, MD [place], [place] [address]  Chief complaint: Consult for dyspnea  HPI: 72 year old with history of lymphoma, asthma Diagnosed with lymphoma in 2010 and underwent chemotherapy, bone marrow transplant at [facility].  She is followed by Dr. Jana Hakim with no evidence of recurrence.  Followed by Dr. Melvyn Novas in 2015 for asthma.  PFTs in past do not show any obstruction  Referred back for chronic dyspnea on exertion.  She denies any symptoms at rest.  Denies any cough, sputum production, wheezing.  There are no seasonal allergies, acid reflux.  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  2-3 pack years.  Quit in 1993 Travel History: Grew up in [place].  She lived all over the [place] and [place].  Moved to [place] in 1993.  No recent travel  Interim history: Continue to have dyspnea that is unchanged.  No new complaints today She is here for review of her tests including exercise test and CT scan.  Outpatient Encounter Medications as of 08/18/2017  Medication Sig  . ADVAIR DISKUS 100-50 MCG/DOSE AEPB   . atorvastatin (LIPITOR) 20 MG tablet Take 20 mg by mouth daily.  Marland Kitchen azelastine (ASTELIN) 0.1 % nasal spray Place into both nostrils 2 (two) times daily. Use in each nostril as directed  . Cetirizine HCl (ZYRTEC ALLERGY PO) Take by mouth.  . clonazePAM (KLONOPIN) 0.5 MG tablet Take 0.5 mg by mouth 2 (two) times daily as needed for anxiety.  . Cyanocobalamin (VITAMIN B-12 PO) Take 6,000 mg by mouth.  . escitalopram (LEXAPRO) 20 MG tablet Take 10 mg by mouth daily.   Marland Kitchen esomeprazole (NEXIUM) 40 MG capsule Take 30-60 min before first meal of the day  . famotidine (PEPCID) 20 MG tablet  One at bedtime (Patient taking differently: Take 20 mg by mouth at bedtime. One at bedtime)  . fluticasone (FLONASE) 50 MCG/ACT nasal spray   . isometheptene-acetaminophen-dichloralphenazone (MIDRIN) 65-100-325 MG capsule Take 1 capsule by mouth 4 (four) times daily as needed for migraine. Maximum 5 capsules in 12 hours for migraine headaches, 8 capsules in 24 hours for tension headaches.  . levothyroxine (SYNTHROID, LEVOTHROID) 75 MCG tablet   . metFORMIN (GLUCOPHAGE-XR) 500 MG 24 hr tablet TAKE 1 TABLET BY MOUTH EVERY DAY WITH EVENING MEAL  . mirabegron ER (MYRBETRIQ) 50 MG TB24 tablet Take 50 mg by mouth daily.  . montelukast (SINGULAIR) 10 MG tablet Take 10 mg by mouth at bedtime.  . multivitamin-iron-minerals-folic acid (CENTRUM) chewable tablet Chew 1 tablet by mouth daily.  Marland Kitchen neomycin-bacitracin-polymyxin (NEOSPORIN) ointment Apply 1 application topically every 12 (twelve) hours.  . NYSTATIN PO Take by mouth.  . pramipexole (MIRAPEX) 0.25 MG tablet   . SYNTHROID 100 MCG tablet   . UNABLE TO FIND Med Name: 767 (nyst/taccr,aqua) cream as needed  . [DISCONTINUED] Calcium Carbonate-Vit D-Min (CALTRATE PLUS PO) Take 2 tablets by mouth 2 (two) times daily.    No facility-administered encounter medications on file as of 08/18/2017.     Allergies as of 08/18/2017 - Review Complete 08/18/2017  Allergen Reaction Noted  . Cephalexin  06/10/2011  . Sudafed [pseudoephedrine hcl] Itching and Swelling 04/02/2013    Past Medical History:  Diagnosis Date  .  Asthma   . Diabetes mellitus without complication ([place])   . Headache   . hodgkins lymphoma dx'd 07/14/09 & 05/2009   chemo comp; bone marrow transplant  . OSA (obstructive sleep apnea)    not using CPAP- unable to tolerate    Past Surgical History:  Procedure Laterality Date  . BONE MARROW TRANSPLANT  2011  . CHOLECYSTECTOMY  1966  . VESICOVAGINAL FISTULA CLOSURE W/ TAH  1999    Family History  Problem Relation Age of Onset  . Heart  disease Father     Social History   Socioeconomic History  . Marital status: Married    Spouse name: Not on file  . Number of children: Not on file  . Years of education: Not on file  . Highest education level: Not on file  Occupational History  . 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 Financial resource strain: Not on file  . Food insecurity:    Worry: Not on file    Inability: Not on file  . Transportation needs:    Medical: Not on file    Non-medical: Not on file  Tobacco Use  . Smoking status: Former Smoker    Packs/day: 0.25    Years: 10.00    Pack years: 2.50    Types: Cigarettes    Last attempt to quit: 05/27/1992    Years since quitting: 25.2  . Smokeless tobacco: Never Used  Substance and Sexual Activity  . Alcohol use: Yes    Comment: socially  . Drug use: No  . Sexual activity: Not on file  Lifestyle  . Physical activity:    Days per week: Not on file    Minutes per session: Not on file  . Stress: Not on file  Relationships  . Social connections:    Talks on phone: Not on file    Gets together: Not on file    Attends religious service: Not on file    Active member of club or organization: Not on file    Attends meetings of clubs or organizations: Not on file    Relationship status: Not on file  . Intimate partner violence:    Fear of current or ex partner: Not on file    Emotionally abused: Not on file    Physically abused: Not on file    Forced sexual activity: Not on file  Other Topics Concern  . Not on file  Social History Narrative  . Not on file    Review of systems: Review of Systems  Constitutional: Negative for fever and chills.  HENT: Negative.   Eyes: Negative for blurred vision.  Respiratory: as per HPI  Cardiovascular: Negative for chest pain and palpitations.  Gastrointestinal: Negative for vomiting, diarrhea, blood per rectum. Genitourinary: Negative for dysuria,  urgency, frequency and hematuria.  Musculoskeletal: Negative for myalgias, back pain and joint pain.  Skin: Negative for itching and rash.  Neurological: Negative for dizziness, tremors, focal weakness, seizures and loss of consciousness.  Endo/Heme/Allergies: Negative for environmental allergies.  Psychiatric/Behavioral: Negative for depression, suicidal ideas and hallucinations.  All other systems reviewed and are negative.  Physical Exam: Blood pressure (!) 142/84, pulse 85, height 4\' 11"  (1.499 m), weight 197 lb (89.4 kg), SpO2 95 %. Gen:      No acute distress HEENT:  EOMI, sclera anicteric Neck:     No masses; no thyromegaly Lungs:    Clear to auscultation  bilaterally; normal respiratory effort CV:         Regular rate and rhythm; no murmurs Abd:      + bowel sounds; soft, non-tender; no palpable masses, no distension Ext:    No edema; adequate peripheral perfusion Skin:      Warm and dry; no rash Neuro: alert and oriented x 3 Psych: normal mood and affect  Data Reviewed: FENO 07/21/17-17  CBC 12/25/16-WBC 9.4, eos 2.5%, absolute eosinophil count 200  Imaging CT chest 10/07/16-clear lungs, chronic left pleural thickening CT chest 09/21/10- borderline enlarged right hilar node. PET scan 09/21/10-no abnormal uptake.  Right hilar node is not FDG avid. CT high-resolution 07/29/17-patchy air trapping, mild bronchial wall thickening.  Very mild groundglass at base unchanged since 2013. 5 mm left upper lobe pulmonary nodule. I have reviewed the images personally.  PFTs 03/12/14 FVC 1.57 [7%], FEV1 1.37 [98%], F/F 87, TLC 67%, DLCO 80% Moderate restriction.  No obstruction  CPST 08/03/17 Exercise testing with gas exchange demonstrates low-normal functional capacity when compared to matched sedentary norms. There is no indication for exercise-induced bronchospasm. At peak exercise, patient is primarily ventilatory restricted due to her severe obesity. Pre-exercise spirometry demonstrates  this restrictive pattern at rest. There was mild chronotropic incompetence and patient likely has a mild cardiovascular limitation.   CBC differential 07/21/17-WBC 12.7, eos 2.1%, absolute eosinophil count 300 Blood allergy profile 07/21/17-IgE 1100, sensitive to dust mite, cat, dog, Aspergillus, tree pollen  Assessment:  Mild persistent asthma Has persistent symptoms on Advair.  CT shows persistent bronchial wall inflammation and at trapping We will switch her to [facility] 200 Suggested that she get repeat PFTs but she declined due to the cost involved.   Exercise test reviewed.  As suspected her dyspnea may be related to her weight and deconditioning.  She admits that she does not exert herself at all.  I have encouraged her to exercise at home and work on diet.   Sub cm pulmonary nodule Follow up CT in 6 months   Plan/Recommendations: - Stop advair. Switch to breo - Diet, weight loss and exercise  Marshell Garfinkel MD  Pulmonary and Critical Care Pager [phone] 08/18/2017, 2:45 PM  CC: Harlan Stains, MD

## 2017-08-18 NOTE — Patient Instructions (Signed)
CT shows evidence of asthma, mild bronchitis and minimal scarring There is a lung nodule which needs follow-up.  You have a follow-up CT scheduled in 6 months I will see you in clinic after this for review  Your exercise test shows dyspnea secondary to weight and deconditioning Please work on gradual exercise program at home and diet and weight loss

## 2017-08-20 ENCOUNTER — Telehealth: Payer: Self-pay

## 2017-08-20 MED ORDER — FLUTICASONE FUROATE-VILANTEROL 200-25 MCG/INH IN AEPB: 1.0000 | INHALATION_SPRAY | Freq: Every day | RESPIRATORY_TRACT | 3 refills | Status: AC

## 2017-08-20 NOTE — Telephone Encounter (Signed)
Per Dr. Vaughan Browner verbally- change advair to Breo 200 daily. Pt is aware and voiced her understanding. Rx for Breo 200 has been sent to preferred pharmacy. Nothing further is needed.

## 2018-01-30 ENCOUNTER — Ambulatory Visit (INDEPENDENT_AMBULATORY_CARE_PROVIDER_SITE_OTHER)
Admission: RE | Admit: 2018-01-30 | Discharge: 2018-01-30 | Disposition: A | Discharge status: Home / Self Care | Payer: Medicare Other | Source: Ambulatory Visit | Attending: Pulmonary Disease | Admitting: Pulmonary Disease

## 2018-01-30 DIAGNOSIS — R918 Other nonspecific abnormal finding of lung field: Secondary | ICD-10-CM

## 2018-02-10 ENCOUNTER — Other Ambulatory Visit: Payer: Self-pay | Admitting: Pulmonary Disease

## 2018-02-10 DIAGNOSIS — R918 Other nonspecific abnormal finding of lung field: Secondary | ICD-10-CM

## 2018-02-16 ENCOUNTER — Ambulatory Visit: Payer: Medicare Other | Admitting: Pulmonary Disease

## 2018-08-13 ENCOUNTER — Inpatient Hospital Stay: Admission: RE | Admit: 2018-08-13 | Payer: Medicare Other | Source: Ambulatory Visit

## 2018-10-05 ENCOUNTER — Telehealth: Payer: Self-pay | Admitting: *Deleted

## 2018-10-05 NOTE — Telephone Encounter (Signed)
Called pt to reschedule CT appt that was CX due to COVID restrictions. Pt is having surgery 5/12 she will call to reschedule in a week if she decides to have CT.

## 2019-01-27 ENCOUNTER — Telehealth: Payer: Self-pay | Admitting: *Deleted

## 2019-01-27 NOTE — Telephone Encounter (Signed)
Called pt to reschedule CT Chest WO that was scheduled in March and was CX bc of COVID restrictions. Pt is not ready to resch at this time.

## 2019-07-23 ENCOUNTER — Ambulatory Visit: Payer: Medicare Other | Attending: Internal Medicine

## 2019-07-23 DIAGNOSIS — Z23 Encounter for immunization: Secondary | ICD-10-CM | POA: Insufficient documentation

## 2019-07-23 NOTE — Progress Notes (Signed)
   Covid-19 Vaccination Clinic  Name:  Erika [name]    MRN: [id number] DOB: Aug 20, 1945  07/23/2019  [name] was observed post Covid-19 immunization for 30 minutes based on pre-vaccination screening without incidence. She was provided with Vaccine Information Sheet and instruction to access the V-Safe system.   [name] was instructed to call 911 with any severe reactions post vaccine: Marland Kitchen Difficulty breathing  . Swelling of your face and throat  . A fast heartbeat  . A bad rash all over your body  . Dizziness and weakness    Immunizations Administered    Name Date Dose VIS Date Route   Pfizer COVID-19 Vaccine 07/23/2019  9:00 AM 0.3 mL 05/07/2019 Intramuscular   Manufacturer: [place]   Lot: [id number]   [place]: [id number]

## 2019-08-08 ENCOUNTER — Other Ambulatory Visit: Payer: Self-pay

## 2019-08-08 ENCOUNTER — Emergency Department (HOSPITAL_COMMUNITY)
Admission: EM | Admit: 2019-08-08 | Discharge: 2019-08-08 | Disposition: A | Discharge status: Home / Self Care | Payer: Medicare Other | Attending: Emergency Medicine | Admitting: Emergency Medicine

## 2019-08-08 ENCOUNTER — Emergency Department (HOSPITAL_COMMUNITY): Payer: Medicare Other

## 2019-08-08 ENCOUNTER — Encounter (HOSPITAL_COMMUNITY): Payer: Self-pay | Admitting: Obstetrics and Gynecology

## 2019-08-08 DIAGNOSIS — Z87891 Personal history of nicotine dependence: Secondary | ICD-10-CM | POA: Insufficient documentation

## 2019-08-08 DIAGNOSIS — M25529 Pain in unspecified elbow: Secondary | ICD-10-CM

## 2019-08-08 DIAGNOSIS — S52501A Unspecified fracture of the lower end of right radius, initial encounter for closed fracture: Secondary | ICD-10-CM | POA: Diagnosis not present

## 2019-08-08 DIAGNOSIS — Z79899 Other long term (current) drug therapy: Secondary | ICD-10-CM | POA: Diagnosis not present

## 2019-08-08 DIAGNOSIS — E119 Type 2 diabetes mellitus without complications: Secondary | ICD-10-CM | POA: Insufficient documentation

## 2019-08-08 DIAGNOSIS — W109XXA Fall (on) (from) unspecified stairs and steps, initial encounter: Secondary | ICD-10-CM | POA: Insufficient documentation

## 2019-08-08 DIAGNOSIS — Z9481 Bone marrow transplant status: Secondary | ICD-10-CM | POA: Insufficient documentation

## 2019-08-08 DIAGNOSIS — M25539 Pain in unspecified wrist: Secondary | ICD-10-CM

## 2019-08-08 DIAGNOSIS — Z8571 Personal history of Hodgkin lymphoma: Secondary | ICD-10-CM | POA: Insufficient documentation

## 2019-08-08 DIAGNOSIS — Z7984 Long term (current) use of oral hypoglycemic drugs: Secondary | ICD-10-CM | POA: Diagnosis not present

## 2019-08-08 DIAGNOSIS — Y9301 Activity, walking, marching and hiking: Secondary | ICD-10-CM | POA: Diagnosis not present

## 2019-08-08 DIAGNOSIS — J45909 Unspecified asthma, uncomplicated: Secondary | ICD-10-CM | POA: Diagnosis not present

## 2019-08-08 DIAGNOSIS — S6991XA Unspecified injury of right wrist, hand and finger(s), initial encounter: Secondary | ICD-10-CM | POA: Diagnosis present

## 2019-08-08 DIAGNOSIS — M79643 Pain in unspecified hand: Secondary | ICD-10-CM

## 2019-08-08 DIAGNOSIS — Y999 Unspecified external cause status: Secondary | ICD-10-CM | POA: Diagnosis not present

## 2019-08-08 DIAGNOSIS — Y92009 Unspecified place in unspecified non-institutional (private) residence as the place of occurrence of the external cause: Secondary | ICD-10-CM | POA: Diagnosis not present

## 2019-08-08 MED ORDER — FENTANYL CITRATE (PF) 100 MCG/2ML IJ SOLN
50.0000 ug | Freq: Once | INTRAMUSCULAR | Status: AC
  Administered 2019-08-08: 50 ug via INTRAVENOUS
  Filled 2019-08-08: qty 2

## 2019-08-08 MED ORDER — ONDANSETRON 4 MG PO TBDP
4.0000 mg | ORAL_TABLET | Freq: Once | ORAL | Status: AC
  Administered 2019-08-08: 4 mg via ORAL
  Filled 2019-08-08: qty 1

## 2019-08-08 MED ORDER — OXYCODONE-ACETAMINOPHEN 5-325 MG PO TABS
1.0000 | ORAL_TABLET | Freq: Once | ORAL | Status: AC
  Administered 2019-08-08: 1 via ORAL
  Filled 2019-08-08: qty 1

## 2019-08-08 MED ORDER — OXYCODONE-ACETAMINOPHEN 5-325 MG PO TABS: 1.0000 | ORAL_TABLET | Freq: Four times a day (QID) | ORAL | 0 refills | Status: DC | PRN

## 2019-08-08 MED ORDER — LIDOCAINE HCL (PF) 1 % IJ SOLN
20.0000 mL | Freq: Once | INTRAMUSCULAR | Status: AC
  Administered 2019-08-08: 20 mL via INTRADERMAL
  Filled 2019-08-08: qty 30

## 2019-08-08 MED ORDER — ONDANSETRON HCL 4 MG/2ML IJ SOLN
4.0000 mg | Freq: Once | INTRAMUSCULAR | Status: AC
  Administered 2019-08-08: 4 mg via INTRAVENOUS
  Filled 2019-08-08: qty 2

## 2019-08-08 MED ORDER — FENTANYL CITRATE (PF) 100 MCG/2ML IJ SOLN
100.0000 ug | Freq: Once | INTRAMUSCULAR | Status: AC
  Administered 2019-08-08: 100 ug via INTRAVENOUS
  Filled 2019-08-08: qty 2

## 2019-08-08 NOTE — ED Provider Notes (Signed)
[facility] DEPT Provider Note   CSN: [id number] Arrival date & time: 08/08/19  1524     History Chief Complaint  Patient presents with  . Wrist Pain    Erika [name] is a 74 y.o. female possible history of diabetes, Hodgkin's lymphoma who presents via EMS for evaluation of right upper extremity pain after mechanical fall that occurred just prior to ED arrival.  She denies any preceding chest pain or dizziness.  Patient reports that she was stepping out into her deck and states that she misstepped, causing her to fall.  She thinks she fell backward and had her hand outstretched to catch herself.  She does not think she hit her head and denies any LOC.  She has been am able to ambulate a few steps since the incident.  Patient reports pain to her right elbow, right forearm, right wrist.  She states that pain is worse with movement.  She received 100 mcg of fentanyl in route which improved her pain to a 2/10.  She denies any neck pain, chest pain, abdominal pain, nausea/vomiting, numbness/weakness of arms or legs.  She is not currently on blood thinners.  The history is provided by the patient.       Past Medical History:  Diagnosis Date  . Asthma   . Diabetes mellitus without complication ([place])   . Headache   . hodgkins lymphoma dx'd 07/14/09 & 05/2009   chemo comp; bone marrow transplant  . OSA (obstructive sleep apnea)    not using CPAP- unable to tolerate    Patient Active Problem List   Diagnosis Date Noted  . Chronic rhinitis 12/13/2013  . Morbid obesity ([place]) 12/12/2013  . Asthma, chronic 04/02/2013  . 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 BONE MARROW TRANSPLANT  2011  . CHOLECYSTECTOMY  1966  . VESICOVAGINAL FISTULA CLOSURE W/ TAH  1999     OB History   No obstetric history on file.     Family History  Problem Relation Age of Onset  . Heart disease Father      Social History   Tobacco Use  . Smoking status: Former Smoker    Packs/day: 0.25    Years: 10.00    Pack years: 2.50    Types: Cigarettes    Quit date: 05/27/1992    Years since quitting: 27.2  . Smokeless tobacco: Never Used  Substance Use Topics  . Alcohol use: Yes    Comment: socially  . Drug use: No    Home Medications Prior to Admission medications   Medication Sig Start Date End Date Taking? Authorizing Provider  ADVAIR DISKUS 100-50 MCG/DOSE AEPB  07/20/15   [provider]  atorvastatin (LIPITOR) 20 MG tablet Take 20 mg by mouth daily.    [provider]  azelastine (ASTELIN) 0.1 % nasal spray Place into both nostrils 2 (two) times daily. Use in each nostril as directed    [provider]  Cetirizine HCl (ZYRTEC ALLERGY PO) Take by mouth.    [provider]  clonazePAM (KLONOPIN) 0.5 MG tablet Take 0.5 mg by mouth 2 (two) times daily as needed for anxiety.    [provider]  Cyanocobalamin (VITAMIN B-12 PO) Take 6,000 mg by mouth.    [provider]  escitalopram (LEXAPRO) 20 MG tablet Take 10 mg by mouth daily.     [provider]  esomeprazole (NEXIUM) 40 MG capsule  Take 30-60 min before first meal of the day 12/13/13   Tanda Rockers, MD  famotidine (PEPCID) 20 MG tablet One at bedtime Patient taking differently: Take 20 mg by mouth at bedtime. One at bedtime 04/02/13   Tanda Rockers, MD  fluticasone Asencion Islam) 50 MCG/ACT nasal spray  07/20/15   [provider]  fluticasone furoate-vilanterol (BREO ELLIPTA) 200-25 MCG/INH AEPB Inhale 1 puff into the lungs daily. 08/20/17   Marshell Garfinkel, MD  isometheptene-acetaminophen-dichloralphenazone (MIDRIN) 65-100-325 MG capsule Take 1 capsule by mouth 4 (four) times daily as needed for migraine. Maximum 5 capsules in 12 hours for migraine headaches, 8 capsules in 24 hours for tension headaches.    [provider]  levothyroxine (SYNTHROID,  LEVOTHROID) 75 MCG tablet  08/10/15   [provider]  metFORMIN (GLUCOPHAGE-XR) 500 MG 24 hr tablet TAKE 1 TABLET BY MOUTH EVERY DAY WITH EVENING MEAL 07/11/17   [provider]  mirabegron ER (MYRBETRIQ) 50 MG TB24 tablet Take 50 mg by mouth daily.    [provider]  montelukast (SINGULAIR) 10 MG tablet Take 10 mg by mouth at bedtime.    [provider]  multivitamin-iron-minerals-folic acid (CENTRUM) chewable tablet Chew 1 tablet by mouth daily.    [provider]  neomycin-bacitracin-polymyxin (NEOSPORIN) ointment Apply 1 application topically every 12 (twelve) hours.    [provider]  NYSTATIN PO Take by mouth.    [provider]  oxyCODONE-acetaminophen (PERCOCET/ROXICET) 5-325 MG tablet Take 1 tablet by mouth every 6 (six) hours as needed for severe pain. 08/08/19   Volanda Napoleon, PA-C  pramipexole (MIRAPEX) 0.25 MG tablet  07/20/15   [provider]  SYNTHROID 100 MCG tablet  07/17/17   [provider]  UNABLE TO FIND Med Name: [id number] (nyst/taccr,aqua) cream as needed    [provider]    Allergies    Cephalexin and Sudafed [pseudoephedrine hcl]  Review of Systems   Review of Systems  Constitutional: Negative for fever.  Respiratory: Negative for cough and shortness of breath.   Cardiovascular: Negative for chest pain.  Gastrointestinal: Negative for abdominal pain, nausea and vomiting.  Genitourinary: Negative for dysuria and hematuria.  Musculoskeletal: Negative for neck pain.       Right upper extremity pain  Neurological: Negative for weakness, numbness and headaches.  All other systems reviewed and are negative.   Physical Exam Updated Vital Signs BP 138/63 (BP Location: Left Arm)   Pulse 82   Temp 98.4 F (36.9 C) (Oral)   Resp 16   Ht 4\' 10"  (1.473 m)   Wt 79.8 kg   SpO2 93%   BMI 36.78 kg/m   Physical Exam Vitals and nursing note reviewed.  Constitutional:       Appearance: Normal appearance. She is well-developed.  HENT:     Head: Normocephalic and atraumatic.     Comments: No tenderness to palpation of skull. No deformities or crepitus noted. No open wounds, abrasions or lacerations.  Eyes:     General: Lids are normal.     Conjunctiva/sclera: Conjunctivae normal.     Pupils: Pupils are equal, round, and reactive to light.     Comments: PERRL. EOMs intact. No nystagmus. No neglect.   Neck:     Comments: No midline C-spine tenderness. Cardiovascular:     Rate and Rhythm: Normal rate and regular rhythm.     Pulses: Normal pulses.          Radial pulses are 2+ on  the right side and 2+ on the left side.     Heart sounds: Normal heart sounds. No murmur. No friction rub. No gallop.   Pulmonary:     Effort: Pulmonary effort is normal.     Breath sounds: Normal breath sounds.  Abdominal:     Palpations: Abdomen is soft. Abdomen is not rigid.     Tenderness: There is no abdominal tenderness. There is no guarding.  Musculoskeletal:        General: Normal range of motion.     Cervical back: Full passive range of motion without pain.     Comments: Tenderness palpation noted diffusely from right elbow that extends distally towards the right forearm.  There is obvious deformity noted to the distal forearm with some dorsal angulation.  Limited range of motion secondary to pain.  She can wiggle all 5 digits but does report some pain with doing so.  No bony tenderness noted to left upper extremity.  No pelvic instability noted.  No tenderness palpation noted bilateral lower extremities.  Skin:    General: Skin is warm and dry.     Capillary Refill: Capillary refill takes less than 2 seconds.     Comments: Good distal cap refill.  RUE is not dusky in appearance or cool to touch.  Neurological:     Mental Status: She is alert and oriented to person, place, and time.     Comments: Sensation intact along major nerve distributions of BUE  Psychiatric:         Speech: Speech normal.     ED Results / Procedures / Treatments   Labs (all labs ordered are listed, but only abnormal results are displayed) Labs Reviewed - No data to display  EKG None  Radiology DG Elbow 2 Views Right  Result Date: 08/08/2019 CLINICAL DATA:  Fall with right upper extremity pain from the hand to the elbow. EXAM: RIGHT ELBOW - 2 VIEW COMPARISON:  None. FINDINGS: Findings suspicious for impacted radial neck fracture. Lateral view is suboptimal due to difficulties with positioning. Possible joint effusion, not well assessed on provided views. No dislocation. Mild osteoarthritis. IMPRESSION: Findings suspicious for impacted radial neck fracture. Lateral view is suboptimal given difficulties with positioning, possible elbow joint effusion. Electronically Signed   By: Keith Rake M.D.   On: 08/08/2019 16:34   DG Forearm Right  Result Date: 08/08/2019 CLINICAL DATA:  Fall with right upper extremity pain from the hand to the elbow. EXAM: RIGHT FOREARM - 2 VIEW COMPARISON:  None. FINDINGS: Distal radius and ulna styloid fractures better assessed on concurrent wrist exam. Radial neck fracture again questioned. No fracture of the radial or ulnar shafts. Soft tissue edema is noted. IMPRESSION: 1. Distal radius and ulna styloid fractures better assessed on concurrent wrist exam. 2. Questionable radial neck fracture, also suspected on concurrent elbow exam. Electronically Signed   By: Keith Rake M.D.   On: 08/08/2019 16:39   DG Wrist 2 Views Right  Result Date: 08/08/2019 CLINICAL DATA:  Fall with right upper extremity pain from the hand to the elbow. EXAM: RIGHT WRIST - 2 VIEW COMPARISON:  None. FINDINGS: Displaced distal radial metaphyseal fracture with 1/2 shaft width dorsal displacement of distal fracture fragment. Fracture is comminuted and extends to the distal radioulnar and radiocarpal joints. There is a mildly displaced ulna styloid fracture. Carpal bones remain  aligned with the distal radius fragment. Carpal bones are not well assessed, however suspected triquetrum fracture overlying the dorsal proximal carpal row  on the lateral view. Soft tissue edema about the wrist. IMPRESSION: 1. Displaced comminuted distal radial metaphyseal fracture extending to the radiocarpal and distal radioulnar joints. Distal fracture fragment is displaced 1 shaft width dorsally. 2. Mildly displaced ulna styloid fracture. 3. Possible triquetrum fracture, age indeterminate. Electronically Signed   By: Keith Rake M.D.   On: 08/08/2019 16:38   DG Wrist Complete Right  Result Date: 08/08/2019 CLINICAL DATA:  74 year old female with right wrist pain. EXAM: RIGHT WRIST - COMPLETE 3+ VIEW COMPARISON:  Earlier radiograph dated 08/08/2019. FINDINGS: No significant interval change in the displacement and angulation of the distal radial fracture. A fracture of the ulnar styloid is also noted similar to prior radiograph. There has been interval placement of a cast. IMPRESSION: Interval placement of a cast. No significant interval change in the appearance of the distal radial or ulnar styloid fracture since the most recent prior radiograph. Electronically Signed   By: Anner Crete M.D.   On: 08/08/2019 19:47   DG Wrist Complete Right  Result Date: 08/08/2019 CLINICAL DATA:  Fracture, post reduction EXAM: RIGHT WRIST - COMPLETE 3+ VIEW COMPARISON:  08/08/2019, 4:09 p.m. FINDINGS: Improved alignment of fractures of the distal right radius and ulna, however there remains significant impaction and angulation of the radius. There remains a possible triquetral avulsion fracture over the dorsal carpus. Diffuse soft tissue edema. IMPRESSION: Improved alignment of fractures of the distal right radius and ulna, however there remains significant impaction and angulation of the radius. Electronically Signed   By: Eddie Candle M.D.   On: 08/08/2019 18:48   DG Hand 2 View Right  Result Date:  08/08/2019 CLINICAL DATA:  Fall with right upper extremity pain from the hand to the elbow. EXAM: RIGHT HAND - 2 VIEW COMPARISON:  None. FINDINGS: Distal radius and ulna fractures better assessed on concurrent wrist exam. PA view is suboptimal given difficulties with positioning. Thumb appears abnormally positioned on the PA view, however normal on the lateral view, felt to be due to positioning. Osseous overlap limits assessment of the digits. There is no obvious fracture of the hand. IMPRESSION: No obvious fracture of the hand, limitations related to difficulty with positioning. Distal radius and ulna fractures better assessed on concurrent wrist exam. Electronically Signed   By: Keith Rake M.D.   On: 08/08/2019 16:36    Procedures Reduction of fracture  Date/Time: 08/08/2019 8:34 PM Performed by: Volanda Napoleon, PA-C Authorized by: Volanda Napoleon, PA-C  Consent: Verbal consent obtained. Consent given by: patient Patient understanding: patient states understanding of the procedure being performed Patient consent: the patient's understanding of the procedure matches consent given Procedure consent: procedure consent matches procedure scheduled Relevant documents: relevant documents present and verified Test results: test results available and properly labeled Site marked: the operative site was marked Patient identity confirmed: verbally with patient Local anesthesia used: no  Anesthesia: Local anesthesia used: no  Sedation: Patient sedated: no  Patient tolerance: patient tolerated the procedure well with no immediate complications  .Nerve Block  Date/Time: 08/08/2019 8:35 PM Performed by: Volanda Napoleon, PA-C Authorized by: Volanda Napoleon, PA-C   Consent:    Consent obtained:  Verbal   Consent given by:  Patient   Risks discussed:  Allergic reaction, infection, nerve damage and swelling Indications:    Indications:  Pain relief and procedural  anesthesia Location:    Body area:  Upper extremity   Upper extremity nerve:  Radial   Laterality:  Right Pre-procedure details:  Skin preparation:  Alcohol Skin anesthesia (see MAR for exact dosages):    Skin anesthesia method:  Local infiltration   Local anesthetic:  Lidocaine 1% w/o epi Procedure details (see MAR for exact dosages):    Block needle gauge:  25 G   Injection procedure:  Anatomic landmarks identified, incremental injection, introduced needle and anatomic landmarks palpated Post-procedure details:    Outcome:  Anesthesia achieved   Patient tolerance of procedure:  Tolerated well, no immediate complications Comments:     Hematoma block was performed of the right upper extremity using lidocaine without epinephrine.  Patient had good anesthesia achieved.   (including critical care time)  Medications Ordered in ED Medications  ondansetron (ZOFRAN) injection 4 mg (4 mg Intravenous Given 08/08/19 1633)  fentaNYL (SUBLIMAZE) injection 50 mcg (50 mcg Intravenous Given 08/08/19 1634)  lidocaine (PF) (XYLOCAINE) 1 % injection 20 mL (20 mLs Intradermal Given by Other 08/08/19 1806)  fentaNYL (SUBLIMAZE) injection 100 mcg (100 mcg Intravenous Given 08/08/19 1805)  oxyCODONE-acetaminophen (PERCOCET/ROXICET) 5-325 MG per tablet 1 tablet (1 tablet Oral Given 08/08/19 2008)  ondansetron (ZOFRAN-ODT) disintegrating tablet 4 mg (4 mg Oral Given 08/08/19 2008)    ED Course  I have reviewed the triage vital signs and the nursing notes.  Pertinent labs & imaging results that were available during my care of the patient were reviewed by me and considered in my medical decision making (see chart for details).    MDM Rules/Calculators/A&P                      74 year old female who presents for evaluation after mechanical fall that occurred just prior to ED arrival.  Patient reports that she thinks she fell backwards with her hand outstretched.  Did not hit her head or have any LOC.   Reports obvious deformity noted to right upper extremity. Patient is afebrile, non-toxic appearing, sitting comfortably on examination table. Vital signs reviewed and stable.  Patient is neurovascularly intact.  On exam, she has limited range of motion secondary to pain as well as tenderness noted to the right upper extremity from the elbow that extends distally.  Noted deformity noted to distal forearm.  Concern for fracture versus dislocation.  Plan for x-rays.  X-ray shows displaced comminuted distal radial metaphyseal fracture extending into the radiocarpal and distal radial ulnar joints.  The distal fracture fragment is displaced 1 shaft width dorsally.  Mildly displaced ulnar styloid fracture.  Possible triquetrum fracture.  Discussed patient with Dr. Claudia Desanctis (hand).  He recommends attempting reduction here in the emergency department and will plan to follow-up with her in the office.  Hematoma block and reduction performed as documented above.  Patient did have improved alignment.  She still had some residual dislocation so she was put in the finger traps.  Postreduction x-ray shows improved alignment of the distal right radius and ulna.  There does remain some significant and impaction and angulation of the radius.   After both manipulation in finger traps, we are not able to align it any better.  We will plan for splint.  Evaluation of splint placement shows good distal cap refill and sensation.  Will give patient pain medication for acute pain.  I discussed with Dr. Claudia Desanctis he will plan to see her in the outpatient office.  I instructed patient that she will need to follow-up. At this time, patient exhibits no emergent life-threatening condition that require further evaluation in ED or admission. Patient had ample opportunity for  questions and discussion. All patient's questions were answered with full understanding. Strict return precautions discussed. Patient expresses understanding and agreement  to plan.   Portions of this note were generated with [employer]. Dictation errors may occur despite best attempts at proofreading.  Final Clinical Impression(s) / ED Diagnoses Final diagnoses:  Closed fracture of distal end of right radius, unspecified fracture morphology, initial encounter    Rx / DC Orders ED Discharge Orders         Ordered    oxyCODONE-acetaminophen (PERCOCET/ROXICET) 5-325 MG tablet  Every 6 hours PRN     08/08/19 1955           Desma Mcgregor 08/08/19 2317    Quintella Reichert, MD 08/11/19 [phone]

## 2019-08-08 NOTE — ED Notes (Signed)
Patient's right hand in finger traps

## 2019-08-08 NOTE — Discharge Instructions (Signed)
You can take Tylenol or Ibuprofen as directed for pain. You can alternate Tylenol and Ibuprofen every 4 hours. If you take Tylenol at 1pm, then you can take Ibuprofen at 5pm. Then you can take Tylenol again at 9pm.   You should not exceeed 4000 mg of tylenol a day.   Take pain medications as directed for break through pain. Do not drive or operate machinery while taking this medication.   Do not get the splint wet.  Elevate the hand to help with swelling.  Follow-up with the referred hand doctor for further evaluation.  Call his office tomorrow.  Return the emergency department for any worsening pain, discoloration of the finger, swelling or any other worsening concerning symptoms.

## 2019-08-08 NOTE — Progress Notes (Signed)
Orthopedic Tech Progress Note Patient Details:  FREDDI HERCZEG Jul 25, 1945 [id number]  Ortho Devices Type of Ortho Device: Arm sling, Sugartong splint, Finger trap Ortho Device/Splint Location: RUE Ortho Device/Splint Interventions: Ordered, Application   Post Interventions Patient Tolerated: Well Instructions Provided: Care of device, Adjustment of device   Janit Pagan 08/08/2019, 7:42 PM

## 2019-08-08 NOTE — ED Notes (Signed)
Patients family contacted and updated.

## 2019-08-08 NOTE — ED Notes (Signed)
Waiting for patient to return from X-ray, will give medication upon return

## 2019-08-09 ENCOUNTER — Emergency Department (HOSPITAL_COMMUNITY): Payer: Medicare Other

## 2019-08-09 ENCOUNTER — Encounter: Payer: Self-pay | Admitting: Plastic Surgery

## 2019-08-09 ENCOUNTER — Ambulatory Visit (INDEPENDENT_AMBULATORY_CARE_PROVIDER_SITE_OTHER): Payer: Medicare Other | Admitting: Plastic Surgery

## 2019-08-09 ENCOUNTER — Encounter (HOSPITAL_BASED_OUTPATIENT_CLINIC_OR_DEPARTMENT_OTHER): Payer: Self-pay | Admitting: Plastic Surgery

## 2019-08-09 ENCOUNTER — Emergency Department (HOSPITAL_COMMUNITY)
Admission: EM | Admit: 2019-08-09 | Discharge: 2019-08-10 | Disposition: A | Discharge status: Home / Self Care | Payer: Medicare Other | Attending: Emergency Medicine | Admitting: Emergency Medicine

## 2019-08-09 ENCOUNTER — Other Ambulatory Visit: Payer: Self-pay

## 2019-08-09 VITALS — BP 123/72 | HR 85 | Temp 98.2°F | Ht <= 58 in | Wt 176.0 lb

## 2019-08-09 DIAGNOSIS — S52501A Unspecified fracture of the lower end of right radius, initial encounter for closed fracture: Secondary | ICD-10-CM | POA: Diagnosis not present

## 2019-08-09 DIAGNOSIS — E039 Hypothyroidism, unspecified: Secondary | ICD-10-CM | POA: Insufficient documentation

## 2019-08-09 DIAGNOSIS — Z8571 Personal history of Hodgkin lymphoma: Secondary | ICD-10-CM | POA: Insufficient documentation

## 2019-08-09 DIAGNOSIS — Z87891 Personal history of nicotine dependence: Secondary | ICD-10-CM | POA: Diagnosis not present

## 2019-08-09 DIAGNOSIS — R531 Weakness: Secondary | ICD-10-CM | POA: Insufficient documentation

## 2019-08-09 DIAGNOSIS — Z7984 Long term (current) use of oral hypoglycemic drugs: Secondary | ICD-10-CM | POA: Insufficient documentation

## 2019-08-09 DIAGNOSIS — Z20822 Contact with and (suspected) exposure to covid-19: Secondary | ICD-10-CM | POA: Insufficient documentation

## 2019-08-09 DIAGNOSIS — N3000 Acute cystitis without hematuria: Secondary | ICD-10-CM

## 2019-08-09 DIAGNOSIS — E119 Type 2 diabetes mellitus without complications: Secondary | ICD-10-CM | POA: Insufficient documentation

## 2019-08-09 DIAGNOSIS — Z79899 Other long term (current) drug therapy: Secondary | ICD-10-CM | POA: Diagnosis not present

## 2019-08-09 DIAGNOSIS — R296 Repeated falls: Secondary | ICD-10-CM | POA: Diagnosis present

## 2019-08-09 DIAGNOSIS — W19XXXA Unspecified fall, initial encounter: Secondary | ICD-10-CM

## 2019-08-09 LAB — CBC
HCT: 40.3 % (ref 36.0–46.0)
Hemoglobin: 12.4 g/dL (ref 12.0–15.0)
MCH: 27.4 pg (ref 26.0–34.0)
MCHC: 30.8 g/dL (ref 30.0–36.0)
MCV: 89.2 fL (ref 80.0–100.0)
Platelets: UNDETERMINED 10*3/uL (ref 150–400)
RBC: 4.52 MIL/uL (ref 3.87–5.11)
RDW: 15.8 % — ABNORMAL HIGH (ref 11.5–15.5)
WBC: 14 10*3/uL — ABNORMAL HIGH (ref 4.0–10.5)
nRBC: 0 % (ref 0.0–0.2)

## 2019-08-09 LAB — URINALYSIS, ROUTINE W REFLEX MICROSCOPIC
Bilirubin Urine: NEGATIVE
Glucose, UA: NEGATIVE mg/dL
Hgb urine dipstick: NEGATIVE
Ketones, ur: 5 mg/dL — AB
Nitrite: POSITIVE — AB
Protein, ur: NEGATIVE mg/dL
Specific Gravity, Urine: 1.01 (ref 1.005–1.030)
pH: 7 (ref 5.0–8.0)

## 2019-08-09 LAB — BASIC METABOLIC PANEL
Anion gap: 7 (ref 5–15)
BUN: 9 mg/dL (ref 8–23)
CO2: 28 mmol/L (ref 22–32)
Calcium: 8 mg/dL — ABNORMAL LOW (ref 8.9–10.3)
Chloride: 104 mmol/L (ref 98–111)
Creatinine, Ser: 0.81 mg/dL (ref 0.44–1.00)
GFR calc Af Amer: >60 mL/min (ref >60)
GFR calc non Af Amer: >60 mL/min (ref >60)
Glucose, Bld: 177 mg/dL — ABNORMAL HIGH (ref 70–99)
Potassium: 3.8 mmol/L (ref 3.5–5.1)
Sodium: 139 mmol/L (ref 135–145)

## 2019-08-09 MED ORDER — SULFAMETHOXAZOLE-TRIMETHOPRIM 800-160 MG PO TABS
1.0000 | ORAL_TABLET | Freq: Once | ORAL | Status: AC
  Administered 2019-08-09: 1 via ORAL
  Filled 2019-08-09: qty 1

## 2019-08-09 MED ORDER — SULFAMETHOXAZOLE-TRIMETHOPRIM 800-160 MG PO TABS: 1.0000 | ORAL_TABLET | Freq: Two times a day (BID) | ORAL | 0 refills | Status: DC

## 2019-08-09 NOTE — ED Notes (Signed)
Pt repositioned in bed by Probation officer and Sharlot Gowda, RN. Pt requesting to go at this time; explained to pt what she was waiting for and pt is ok with staying at this time.

## 2019-08-09 NOTE — ED Notes (Signed)
Patient was able to ambulate using the walker. Denied SOB and dizziness while ambulating.

## 2019-08-09 NOTE — H&P (View-Only) (Signed)
Referring Provider Harlan Stains, MD [place],  [place] [address]   CC:  Chief Complaint  Patient presents with  . Advice Only    ED visit 08/08/19 wrist fracture      Erika [name] is an 74 y.o. female.  HPI: Patient presents as a follow-up from emergency room.  She had a fall yesterday.  She sustained a comminuted intra-articular dorsally and proximally displaced distal radius fracture.  Reduction in the emergency room improved the alignment but still has quite a bit at dorsal angulation displacement.  She was placed in a sugar tong splint.  There is also suggestion of a possible dorsal triquetral chip fracture.  She has quite a bit of pain and stiffness in the wrist and hand.  Allergies  Allergen Reactions  . Cephalexin   . Sudafed [Pseudoephedrine Hcl] Itching and Swelling  . Pseudoephedrine Hcl Rash    Outpatient Encounter Medications as of 08/09/2019  Medication Sig Note  . ADVAIR DISKUS 100-50 MCG/DOSE AEPB  09/01/2015: Received from: External Pharmacy  . atorvastatin (LIPITOR) 20 MG tablet Take 20 mg by mouth daily.   Marland Kitchen azelastine (ASTELIN) 0.1 % nasal spray Place into both nostrils 2 (two) times daily. Use in each nostril as directed   . Cetirizine HCl (ZYRTEC ALLERGY PO) Take by mouth.   . clonazePAM (KLONOPIN) 0.5 MG tablet Take 0.5 mg by mouth 2 (two) times daily as needed for anxiety.   Marland Kitchen escitalopram (LEXAPRO) 20 MG tablet Take 10 mg by mouth daily.    Marland Kitchen esomeprazole (NEXIUM) 40 MG capsule Take 30-60 min before first meal of the day   . famotidine (PEPCID) 20 MG tablet One at bedtime (Patient taking differently: Take 20 mg by mouth at bedtime. One at bedtime)   . fluticasone (FLONASE) 50 MCG/ACT nasal spray  09/01/2015: Received from: External Pharmacy  . fluticasone furoate-vilanterol (BREO ELLIPTA) 200-25 MCG/INH AEPB Inhale 1 puff into the lungs daily.   . metFORMIN (GLUCOPHAGE-XR) 500 MG 24 hr tablet TAKE 1 TABLET BY MOUTH EVERY DAY WITH  EVENING MEAL   . montelukast (SINGULAIR) 10 MG tablet Take 10 mg by mouth at bedtime.   . NYSTATIN PO Take by mouth.   . oxyCODONE-acetaminophen (PERCOCET/ROXICET) 5-325 MG tablet Take 1 tablet by mouth every 6 (six) hours as needed for severe pain.   . SUMAtriptan (IMITREX) 50 MG tablet Take 50 mg by mouth every 2 (two) hours as needed for migraine. May repeat in 2 hours if headache persists or recurs.   Marland Kitchen SYNTHROID 100 MCG tablet    . UNABLE TO FIND Med Name: [id number] (nyst/taccr,aqua) cream as needed   . neomycin-bacitracin-polymyxin (NEOSPORIN) ointment Apply 1 application topically every 12 (twelve) hours.    No facility-administered encounter medications on file as of 08/09/2019.     Past Medical History:  Diagnosis Date  . Anxiety   . Asthma   . Closed fracture of right distal radius   . Diabetes mellitus without complication ([place])   . GERD (gastroesophageal reflux disease)   . Headache   . hodgkins lymphoma dx'd 07/14/09 & 05/2009   chemo comp; bone marrow transplant  . Hypothyroidism   . OSA (obstructive sleep apnea)    not using CPAP- unable to tolerate    Past Surgical History:  Procedure Laterality Date  . BONE MARROW TRANSPLANT  2011  . CHOLECYSTECTOMY  1966  . VESICOVAGINAL FISTULA CLOSURE W/ TAH  1999    Family History  Problem  Relation Age of Onset  . Heart disease Father     Social History   Social History Narrative  . Not on file     Review of Systems General: Denies fevers, chills, weight loss CV: Denies chest pain, shortness of breath, palpitations  Physical Exam Vitals with BMI 08/09/2019 08/09/2019 08/08/2019  Height 4\' 10"  4\' 10"  -  Weight 176 lbs 190 lbs -  BMI [id number] -  Systolic [id number] - 0000000  Diastolic 72 - 63  Pulse 85 - 82    General:  No acute distress,  Alert and oriented, Non-Toxic, Normal speech and affect Right hand: Fingers well-perfused normal cap refill palp radial pulse.  Sensation is intact throughout.  She can move all fingers  and flex and extend her thumb at the IP joint.  She has limited range of motion at the wrist due to pain and stiffness.  She also has very mild tenderness proximally at the elbow.  X-rays show a comminuted displaced intra-articular distal radius fracture on the right side.  There is a suggestion of a dorsal triquetral chip fracture.  There is also a questionable impacted radial neck fracture proximally about the elbow.  This is only present on one view and is very subtle.  Assessment/Plan Patient presents with a displaced intra-articular right side of distal radius fracture.  I recommended operative fixation and stabilization with a volar plate.  I explained the risks include bleeding, infection, demonstrating structures including nerves and tendons, need for additional procedures.  I discussed of bony complications including nonunion malunion and hardware complications.  I discussed the downside of nonoperative management including persistent displacement of the wrist and collapse leading to early arthritis and loss of function and pain.  I explained the possibility that I would find some other carpal fractures intraoperatively on live imaging based on preoperative x-rays I do not see any obvious evidence of a fracture of the carpal bones that would require further surgery.  I am going to confirm with him orthopedic colleague about the suggestion of a radial neck fracture although I do not believe it looks bad enough to require reduction and fixation.  I am planning to fix her wrist on Friday and we will plan to see her then.  Cindra Presume 08/09/2019, 3:47 PM

## 2019-08-09 NOTE — ED Notes (Signed)
Patients family [place]) contacted and updated.

## 2019-08-09 NOTE — ED Provider Notes (Signed)
[facility] DEPT Provider Note   CSN: [id number] Arrival date & time: 08/09/19  1930     History Chief Complaint  Patient presents with  . Fall    Erika [name] is a 74 y.o. female presenting for evaluation of fall.  Patient states she has had multiple falls in the past 24 to 48 hours.  She initially fell yesterday, was in the ER and broke her wrist.  She fell again at 4 AM this morning when she was going to the bathroom, EMS was called to help get her off the floor and back in bed.  She had no injury at that time.  She saw her orthopedic doctor this afternoon.  This evening she felt like her legs were weak and wobbly, and fell again.  She states her legs have been more weak and wobbly for the past several days, even before her fall yesterday.  She denies lightheadedness or dizziness prior to the fall.  She denies chest pain or shortness of breath prior to the fall.  She denies recent fevers, chills, cough, chest pain, nausea, vomiting, abd pain, urinary symptoms, normal bowel movements.  She denies any leg pain or swelling.  She is not on blood thinners.  Patient states most of her pain at this time is having her right wrist and she feels like she has a knot behind her right shoulder.  She denies head pain, shoulder pain, or elbow pain.   HPI     Past Medical History:  Diagnosis Date  . Anxiety   . Asthma   . Closed fracture of right distal radius   . Diabetes mellitus without complication ([place])   . GERD (gastroesophageal reflux disease)   . Headache   . hodgkins lymphoma dx'd 07/14/09 & 05/2009   chemo comp; bone marrow transplant  . Hypothyroidism   . OSA (obstructive sleep apnea)    not using CPAP- unable to tolerate    Patient Active Problem List   Diagnosis Date Noted  . Chronic rhinitis 12/13/2013  . Morbid obesity ([place]) 12/12/2013  . Asthma, chronic 04/02/2013  . 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 BONE MARROW TRANSPLANT  2011  . CHOLECYSTECTOMY  1966  . VESICOVAGINAL FISTULA CLOSURE W/ TAH  1999     OB History   No obstetric history on file.     Family History  Problem Relation Age of Onset  . Heart disease Father     Social History   Tobacco Use  . Smoking status: Former Smoker    Packs/day: 0.25    Years: 10.00    Pack years: 2.50    Types: Cigarettes    Quit date: 05/27/1992    Years since quitting: 27.2  . Smokeless tobacco: Never Used  Substance Use Topics  . Alcohol use: Yes    Comment: socially  . Drug use: No    Home Medications Prior to Admission medications   Medication Sig Start Date End Date Taking? Authorizing Provider  atorvastatin (LIPITOR) 20 MG tablet Take 20 mg by mouth daily.   Yes [provider]  buPROPion (WELLBUTRIN XL) 150 MG 24 hr tablet Take 150 mg by mouth every morning. 08/09/19  Yes [provider]  Cetirizine HCl (ZYRTEC ALLERGY PO) Take by mouth.   Yes [provider]  clonazePAM (KLONOPIN) 0.5 MG tablet Take 0.5 mg by mouth 2 (two) times daily as needed for  anxiety.   Yes [provider]  escitalopram (LEXAPRO) 20 MG tablet Take 10 mg by mouth daily.    Yes [provider]  esomeprazole (NEXIUM) 40 MG capsule Take 30-60 min before first meal of the day 12/13/13  Yes Tanda Rockers, MD  famotidine (PEPCID) 20 MG tablet One at bedtime Patient taking differently: Take 20 mg by mouth at bedtime. One at bedtime 04/02/13  Yes Tanda Rockers, MD  fluticasone furoate-vilanterol (BREO ELLIPTA) 200-25 MCG/INH AEPB Inhale 1 puff into the lungs daily. 08/20/17  Yes Mannam, Praveen, MD  metFORMIN (GLUCOPHAGE-XR) 500 MG 24 hr tablet Take 500 mg by mouth in the morning and at bedtime.  07/11/17  Yes [provider]  montelukast (SINGULAIR) 10 MG tablet Take 10 mg by mouth at bedtime.   Yes [provider]  nystatin (MYCOSTATIN) 100000 UNIT/ML  suspension Take 4 mLs by mouth 4 (four) times daily as needed for itching. 07/12/19  Yes [provider]  nystatin cream (MYCOSTATIN) Apply 1 application topically 2 (two) times daily as needed for itching. 07/07/19  Yes [provider]  oxybutynin (DITROPAN-XL) 10 MG 24 hr tablet Take 10 mg by mouth daily. 05/17/19  Yes [provider]  oxyCODONE-acetaminophen (PERCOCET/ROXICET) 5-325 MG tablet Take 1 tablet by mouth every 6 (six) hours as needed for severe pain. 08/08/19  Yes Volanda Napoleon, PA-C  SUMAtriptan (IMITREX) 50 MG tablet Take 50 mg by mouth every 2 (two) hours as needed for migraine. May repeat in 2 hours if headache persists or recurs.   Yes [provider]  SYNTHROID 100 MCG tablet Take 100 mcg by mouth daily before breakfast.  07/17/17  Yes [provider]  sulfamethoxazole-trimethoprim (BACTRIM DS) 800-160 MG tablet Take 1 tablet by mouth 2 (two) times daily for 7 days. 08/09/19 08/16/19  Tamel Abel, PA-C    Allergies    Cephalexin, Sudafed [pseudoephedrine hcl], and Pseudoephedrine hcl  Review of Systems   Review of Systems  Musculoskeletal: Positive for arthralgias.  Neurological: Positive for weakness.  All other systems reviewed and are negative.   Physical Exam Updated Vital Signs BP 117/61 (BP Location: Left Arm)   Pulse 80   Temp 98.5 F (36.9 C) (Oral)   Resp (!) 25   Ht 4\' 10"  (1.473 m)   Wt 79.8 kg   SpO2 92%   BMI 36.78 kg/m   Physical Exam Vitals and nursing note reviewed.  Constitutional:      General: She is not in acute distress.    Appearance: She is well-developed. She is obese.     Comments: Obese female resting comfortably in the bed in no acute distress  HENT:     Head: Normocephalic and atraumatic.  Eyes:     Conjunctiva/sclera: Conjunctivae normal.     Pupils: Pupils are equal, round, and reactive to light.  Cardiovascular:     Rate and Rhythm: Normal rate and regular rhythm.    Pulmonary:     Effort: Pulmonary effort is normal. No respiratory distress.     Breath sounds: Normal breath sounds. No wheezing.  Abdominal:     General: There is no distension.     Palpations: Abdomen is soft. There is no mass.     Tenderness: There is no abdominal tenderness. There is no guarding or rebound.  Musculoskeletal:        General: Normal range of motion.     Cervical back: Normal range of motion and neck supple.  Right lower leg: No edema.     Left lower leg: No edema.     Comments: In right sugar tong splint.  Good distal cap refill of the right hand. No humerus or shoulder pain. Tenderness palpation of the right upper back musculature over the trapezius.  No pain over midline spine.  No step-offs or deformities. No leg pain or swelling.  Skin:    General: Skin is warm and dry.     Capillary Refill: Capillary refill takes less than 2 seconds.  Neurological:     Mental Status: She is alert and oriented to person, place, and time.     ED Results / Procedures / Treatments   Labs (all labs ordered are listed, but only abnormal results are displayed) Labs Reviewed  CBC - Abnormal; Notable for the following components:      Result Value   WBC 14.0 (*)    RDW 15.8 (*)    All other components within normal limits  BASIC METABOLIC PANEL - Abnormal; Notable for the following components:   Glucose, Bld 177 (*)    Calcium 8.0 (*)    All other components within normal limits  URINALYSIS, ROUTINE W REFLEX MICROSCOPIC - Abnormal; Notable for the following components:   Ketones, ur 5 (*)    Nitrite POSITIVE (*)    Leukocytes,Ua MODERATE (*)    Bacteria, UA MANY (*)    All other components within normal limits  URINE CULTURE    EKG EKG Interpretation  Date/Time:  Monday August 09 2019 20:28:26 EDT Ventricular Rate:  82 PR Interval:    QRS Duration: 108 QT Interval:  380 QTC Calculation: 444 R Axis:   27 Text Interpretation: Sinus rhythm Low voltage,  precordial leads Confirmed by Lacretia Leigh ([id number]) on 08/09/2019 11:54:33 PM   Radiology DG Shoulder Right  Result Date: 08/09/2019 CLINICAL DATA:  Fall with upper extremity pain EXAM: RIGHT SHOULDER - 2+ VIEW COMPARISON:  None. FINDINGS: No acute displaced fracture or malalignment. Moderate AC joint degenerative change. IMPRESSION: No acute osseous abnormality. Electronically Signed   By: Donavan Foil M.D.   On: 08/09/2019 21:34   DG Elbow 2 Views Right  Result Date: 08/08/2019 CLINICAL DATA:  Fall with right upper extremity pain from the hand to the elbow. EXAM: RIGHT ELBOW - 2 VIEW COMPARISON:  None. FINDINGS: Findings suspicious for impacted radial neck fracture. Lateral view is suboptimal due to difficulties with positioning. Possible joint effusion, not well assessed on provided views. No dislocation. Mild osteoarthritis. IMPRESSION: Findings suspicious for impacted radial neck fracture. Lateral view is suboptimal given difficulties with positioning, possible elbow joint effusion. Electronically Signed   By: Keith Rake M.D.   On: 08/08/2019 16:34   DG Elbow Complete Right  Result Date: 08/09/2019 CLINICAL DATA:  Fall with upper extremity pain EXAM: RIGHT ELBOW - COMPLETE 3+ VIEW COMPARISON:  08/08/2019 FINDINGS: Limited evaluation of the radial head and neck due to positioning and casting material. No obvious dislocation. IMPRESSION: Limited evaluation of radial head and neck due to positioning and osseous overlap. Electronically Signed   By: Donavan Foil M.D.   On: 08/09/2019 21:20   DG Forearm Right  Result Date: 08/08/2019 CLINICAL DATA:  Fall with right upper extremity pain from the hand to the elbow. EXAM: RIGHT FOREARM - 2 VIEW COMPARISON:  None. FINDINGS: Distal radius and ulna styloid fractures better assessed on concurrent wrist exam. Radial neck fracture again questioned. No fracture of the radial or ulnar shafts. Soft tissue  edema is noted. IMPRESSION: 1. Distal radius and  ulna styloid fractures better assessed on concurrent wrist exam. 2. Questionable radial neck fracture, also suspected on concurrent elbow exam. Electronically Signed   By: Keith Rake M.D.   On: 08/08/2019 16:39   DG Wrist 2 Views Right  Result Date: 08/08/2019 CLINICAL DATA:  Fall with right upper extremity pain from the hand to the elbow. EXAM: RIGHT WRIST - 2 VIEW COMPARISON:  None. FINDINGS: Displaced distal radial metaphyseal fracture with 1/2 shaft width dorsal displacement of distal fracture fragment. Fracture is comminuted and extends to the distal radioulnar and radiocarpal joints. There is a mildly displaced ulna styloid fracture. Carpal bones remain aligned with the distal radius fragment. Carpal bones are not well assessed, however suspected triquetrum fracture overlying the dorsal proximal carpal row on the lateral view. Soft tissue edema about the wrist. IMPRESSION: 1. Displaced comminuted distal radial metaphyseal fracture extending to the radiocarpal and distal radioulnar joints. Distal fracture fragment is displaced 1 shaft width dorsally. 2. Mildly displaced ulna styloid fracture. 3. Possible triquetrum fracture, age indeterminate. Electronically Signed   By: Keith Rake M.D.   On: 08/08/2019 16:38   DG Wrist Complete Right  Result Date: 08/09/2019 CLINICAL DATA:  Status post fall. EXAM: RIGHT WRIST - COMPLETE 3+ VIEW COMPARISON:  None. FINDINGS: It should be noted that the right wrist was imaged in a fiberglass cast with subsequently obscured osseous and soft tissue detail. A comminuted fracture deformity of the distal right radius is seen with extension to involve the radiocarpal articulation. Approximately 1/2 shaft width dorsal displacement of the distal fracture site is seen. An additional nondisplaced fracture of the distal right ulna is noted. There is no evidence of dislocation. Degenerative changes are seen throughout the right wrist. Soft tissues are unremarkable.  IMPRESSION: 1. Colles fracture of the distal right radius with an additional nondisplaced fracture of the distal right ulna. 2. Degenerative changes throughout the right wrist. The presence of a subtle fracture deformity cannot be excluded. CT correlation is recommended. Electronically Signed   By: Virgina Norfolk M.D.   On: 08/09/2019 21:20   DG Wrist Complete Right  Result Date: 08/08/2019 CLINICAL DATA:  74 year old female with right wrist pain. EXAM: RIGHT WRIST - COMPLETE 3+ VIEW COMPARISON:  Earlier radiograph dated 08/08/2019. FINDINGS: No significant interval change in the displacement and angulation of the distal radial fracture. A fracture of the ulnar styloid is also noted similar to prior radiograph. There has been interval placement of a cast. IMPRESSION: Interval placement of a cast. No significant interval change in the appearance of the distal radial or ulnar styloid fracture since the most recent prior radiograph. Electronically Signed   By: Anner Crete M.D.   On: 08/08/2019 19:47   DG Wrist Complete Right  Result Date: 08/08/2019 CLINICAL DATA:  Fracture, post reduction EXAM: RIGHT WRIST - COMPLETE 3+ VIEW COMPARISON:  08/08/2019, 4:09 p.m. FINDINGS: Improved alignment of fractures of the distal right radius and ulna, however there remains significant impaction and angulation of the radius. There remains a possible triquetral avulsion fracture over the dorsal carpus. Diffuse soft tissue edema. IMPRESSION: Improved alignment of fractures of the distal right radius and ulna, however there remains significant impaction and angulation of the radius. Electronically Signed   By: Eddie Candle M.D.   On: 08/08/2019 18:48   CT Head Wo Contrast  Result Date: 08/09/2019 CLINICAL DATA:  Fall EXAM: CT HEAD WITHOUT CONTRAST TECHNIQUE: Contiguous axial images were obtained from  the base of the skull through the vertex without intravenous contrast. COMPARISON:  None. FINDINGS: Brain: No acute  territorial infarction, hemorrhage or intracranial mass. Advanced atrophy. Extensive hypodensity in the white matter consistent with chronic small vessel ischemic change. Nonenlarged ventricles Vascular: No hyperdense vessels.  Carotid vascular calcification Skull: Normal. Negative for fracture or focal lesion. Sinuses/Orbits: No acute finding. Other: None IMPRESSION: 1. No CT evidence for acute intracranial abnormality. 2. Atrophy and chronic small vessel ischemic change of the white matter Electronically Signed   By: Donavan Foil M.D.   On: 08/09/2019 22:16   CT Cervical Spine Wo Contrast  Result Date: 08/09/2019 CLINICAL DATA:  Head trauma EXAM: CT CERVICAL SPINE WITHOUT CONTRAST TECHNIQUE: Multidetector CT imaging of the cervical spine was performed without intravenous contrast. Multiplanar CT image reconstructions were also generated. COMPARISON:  None. FINDINGS: Alignment: Normal. Skull base and vertebrae: No acute fracture. No primary bone lesion or focal pathologic process. Soft tissues and spinal canal: No prevertebral fluid or swelling. No visible canal hematoma. Disc levels: Mild degenerative changes at C5-C6 and C6-C7. Facet degenerative changes at multiple Upper chest: Negative. Other: None IMPRESSION: Degenerative changes.  No acute osseous abnormality. Electronically Signed   By: Donavan Foil M.D.   On: 08/09/2019 22:19   DG Hand 2 View Right  Result Date: 08/08/2019 CLINICAL DATA:  Fall with right upper extremity pain from the hand to the elbow. EXAM: RIGHT HAND - 2 VIEW COMPARISON:  None. FINDINGS: Distal radius and ulna fractures better assessed on concurrent wrist exam. PA view is suboptimal given difficulties with positioning. Thumb appears abnormally positioned on the PA view, however normal on the lateral view, felt to be due to positioning. Osseous overlap limits assessment of the digits. There is no obvious fracture of the hand. IMPRESSION: No obvious fracture of the hand,  limitations related to difficulty with positioning. Distal radius and ulna fractures better assessed on concurrent wrist exam. Electronically Signed   By: Keith Rake M.D.   On: 08/08/2019 16:36    Procedures Procedures (including critical care time)  Medications Ordered in ED Medications  sulfamethoxazole-trimethoprim (BACTRIM DS) 800-160 MG per tablet 1 tablet (1 tablet Oral Given 08/09/19 2339)    ED Course  I have reviewed the triage vital signs and the nursing notes.  Pertinent labs & imaging results that were available during my care of the patient were reviewed by me and considered in my medical decision making (see chart for details).    MDM Rules/Calculators/A&P                      Patient presented for evaluation of fall and weakness.  Physical exam shows patient appears nontoxic.  She has pain of her right trapezius, but no pain elsewhere.  Low suspicion for any fracture dislocation.  However, considering frequent falls and recent weakness, will obtain CT head and neck.  Will obtain imaging of the right upper extremity, labs, and urine. Case discussed with attending, Dr. Roderic Palau evaluated the pt.   CT head and neck negative for acute fracture or bleed.  X-rays of the right shoulder, wrist, and elbow viewed interpreted by me, no new fracture or dislocation.  Shows continued right wrist fracture.  EKG without STEMI, similar to previous.  Labs interpreted by me, mild leukocytosis at 14.  Otherwise reassuring.  Urine positive for UTI with nitrates, leuks, many bacteria.  Discussed findings with patient.  Patient remains nontoxic on reevaluation.  Ambulated without difficulty.  I discussed importance of her using her walker until she gains her strength.  Discussed importance of antibiotics and hydration.  Encourage close follow-up with her primary care doctor.  At this time, patient appears safe for discharge.  Return precautions given.  Patient states she understands and agrees  to plan.  Final Clinical Impression(s) / ED Diagnoses Final diagnoses:  Acute cystitis without hematuria  Weakness  Fall, initial encounter    Rx / DC Orders ED Discharge Orders         Ordered    sulfamethoxazole-trimethoprim (BACTRIM DS) 800-160 MG tablet  2 times daily     08/09/19 2347           Franchot Heidelberg, PA-C 08/09/19 2355    Milton Ferguson, MD 08/10/19 1112

## 2019-08-09 NOTE — Discharge Instructions (Signed)
Take antibiotics as prescribed.  Take the entire course, even if your symptoms improve. Make sure you are using your walker while ambulating until your strength has returned. Make sure you are staying well-hydrated water. Be careful with your pain medication.  Use Tylenol for mild to moderate pain, use the pain medicine only as needed for severe breakthrough pain. Follow-up with your primary care doctor for recheck of your symptoms. Return to the emergency room if you develop high fevers, persistent vomiting, severe worsening pain, worsening weakness, or any new, worsening, or concerning symptoms.

## 2019-08-09 NOTE — Progress Notes (Signed)
Referring Provider Harlan Stains, MD [address] [place],  [place] [address]   CC:  Chief Complaint  Patient presents with  . Advice Only    ED visit 08/08/19 wrist fracture      Erika [name] is an 74 y.o. female.  HPI: Patient presents as a follow-up from emergency room.  She had a fall yesterday.  She sustained a comminuted intra-articular dorsally and proximally displaced distal radius fracture.  Reduction in the emergency room improved the alignment but still has quite a bit at dorsal angulation displacement.  She was placed in a sugar tong splint.  There is also suggestion of a possible dorsal triquetral chip fracture.  She has quite a bit of pain and stiffness in the wrist and hand.  Allergies  Allergen Reactions  . Cephalexin   . Sudafed [Pseudoephedrine Hcl] Itching and Swelling  . Pseudoephedrine Hcl Rash    Outpatient Encounter Medications as of 08/09/2019  Medication Sig Note  . ADVAIR DISKUS 100-50 MCG/DOSE AEPB  09/01/2015: Received from: External Pharmacy  . atorvastatin (LIPITOR) 20 MG tablet Take 20 mg by mouth daily.   Marland Kitchen azelastine (ASTELIN) 0.1 % nasal spray Place into both nostrils 2 (two) times daily. Use in each nostril as directed   . Cetirizine HCl (ZYRTEC ALLERGY PO) Take by mouth.   . clonazePAM (KLONOPIN) 0.5 MG tablet Take 0.5 mg by mouth 2 (two) times daily as needed for anxiety.   Marland Kitchen escitalopram (LEXAPRO) 20 MG tablet Take 10 mg by mouth daily.    Marland Kitchen esomeprazole (NEXIUM) 40 MG capsule Take 30-60 min before first meal of the day   . famotidine (PEPCID) 20 MG tablet One at bedtime (Patient taking differently: Take 20 mg by mouth at bedtime. One at bedtime)   . fluticasone (FLONASE) 50 MCG/ACT nasal spray  09/01/2015: Received from: External Pharmacy  . fluticasone furoate-vilanterol (BREO ELLIPTA) 200-25 MCG/INH AEPB Inhale 1 puff into the lungs daily.   . metFORMIN (GLUCOPHAGE-XR) 500 MG 24 hr tablet TAKE 1 TABLET BY MOUTH EVERY DAY WITH  EVENING MEAL   . montelukast (SINGULAIR) 10 MG tablet Take 10 mg by mouth at bedtime.   . NYSTATIN PO Take by mouth.   . oxyCODONE-acetaminophen (PERCOCET/ROXICET) 5-325 MG tablet Take 1 tablet by mouth every 6 (six) hours as needed for severe pain.   . SUMAtriptan (IMITREX) 50 MG tablet Take 50 mg by mouth every 2 (two) hours as needed for migraine. May repeat in 2 hours if headache persists or recurs.   Marland Kitchen SYNTHROID 100 MCG tablet    . UNABLE TO FIND Med Name: [id number] (nyst/taccr,aqua) cream as needed   . neomycin-bacitracin-polymyxin (NEOSPORIN) ointment Apply 1 application topically every 12 (twelve) hours.    No facility-administered encounter medications on file as of 08/09/2019.     Past Medical History:  Diagnosis Date  . Anxiety   . Asthma   . Closed fracture of right distal radius   . Diabetes mellitus without complication ([place])   . GERD (gastroesophageal reflux disease)   . Headache   . hodgkins lymphoma dx'd 07/14/09 & 05/2009   chemo comp; bone marrow transplant  . Hypothyroidism   . OSA (obstructive sleep apnea)    not using CPAP- unable to tolerate    Past Surgical History:  Procedure Laterality Date  . BONE MARROW TRANSPLANT  2011  . CHOLECYSTECTOMY  1966  . VESICOVAGINAL FISTULA CLOSURE W/ TAH  1999    Family History  Problem  Relation Age of Onset  . Heart disease Father     Social History   Social History Narrative  . Not on file     Review of Systems General: Denies fevers, chills, weight loss CV: Denies chest pain, shortness of breath, palpitations  Physical Exam Vitals with BMI 08/09/2019 08/09/2019 08/08/2019  Height 4\' 10"  4\' 10"  -  Weight 176 lbs 190 lbs -  BMI [id number] -  Systolic [id number] - 0000000  Diastolic 72 - 63  Pulse 85 - 82    General:  No acute distress,  Alert and oriented, Non-Toxic, Normal speech and affect Right hand: Fingers well-perfused normal cap refill palp radial pulse.  Sensation is intact throughout.  She can move all fingers  and flex and extend her thumb at the IP joint.  She has limited range of motion at the wrist due to pain and stiffness.  She also has very mild tenderness proximally at the elbow.  X-rays show a comminuted displaced intra-articular distal radius fracture on the right side.  There is a suggestion of a dorsal triquetral chip fracture.  There is also a questionable impacted radial neck fracture proximally about the elbow.  This is only present on one view and is very subtle.  Assessment/Plan Patient presents with a displaced intra-articular right side of distal radius fracture.  I recommended operative fixation and stabilization with a volar plate.  I explained the risks include bleeding, infection, demonstrating structures including nerves and tendons, need for additional procedures.  I discussed of bony complications including nonunion malunion and hardware complications.  I discussed the downside of nonoperative management including persistent displacement of the wrist and collapse leading to early arthritis and loss of function and pain.  I explained the possibility that I would find some other carpal fractures intraoperatively on live imaging based on preoperative x-rays I do not see any obvious evidence of a fracture of the carpal bones that would require further surgery.  I am going to confirm with him orthopedic colleague about the suggestion of a radial neck fracture although I do not believe it looks bad enough to require reduction and fixation.  I am planning to fix her wrist on Friday and we will plan to see her then.  Cindra Presume 08/09/2019, 3:47 PM

## 2019-08-09 NOTE — ED Triage Notes (Signed)
Patient is coming from home with c/o fall. Patient was getting out of bed and attempting to get to the bathroom when she fell. States she lost her balance and fell forward onto her right arm. No LOC and denies hitting her head. Pain is a 10/10 in right arm. Last took her prescribed oxycodone at 0130pm. A&Ox4.  EMS MEDS: 100 mcg fentanyl nasally  EMS VITALS: BP 122/77 HR 80 RR 20 SPO2 89% RA now 100% 3L [place]

## 2019-08-09 NOTE — ED Notes (Addendum)
Patient unable to urinate at this time. 

## 2019-08-10 ENCOUNTER — Other Ambulatory Visit (HOSPITAL_COMMUNITY)
Admission: RE | Admit: 2019-08-10 | Discharge: 2019-08-10 | Disposition: A | Discharge status: Home / Self Care | Payer: Medicare Other | Source: Ambulatory Visit | Attending: Plastic Surgery | Admitting: Plastic Surgery

## 2019-08-10 ENCOUNTER — Encounter (HOSPITAL_BASED_OUTPATIENT_CLINIC_OR_DEPARTMENT_OTHER)
Admission: RE | Admit: 2019-08-10 | Discharge: 2019-08-10 | Disposition: A | Discharge status: Home / Self Care | Payer: Medicare Other | Source: Ambulatory Visit | Attending: Plastic Surgery | Admitting: Plastic Surgery

## 2019-08-10 DIAGNOSIS — E119 Type 2 diabetes mellitus without complications: Secondary | ICD-10-CM | POA: Diagnosis not present

## 2019-08-10 DIAGNOSIS — R531 Weakness: Secondary | ICD-10-CM | POA: Diagnosis not present

## 2019-08-10 DIAGNOSIS — Z20822 Contact with and (suspected) exposure to covid-19: Secondary | ICD-10-CM | POA: Diagnosis not present

## 2019-08-10 DIAGNOSIS — N3000 Acute cystitis without hematuria: Secondary | ICD-10-CM | POA: Diagnosis not present

## 2019-08-10 LAB — BASIC METABOLIC PANEL
Anion gap: 10 (ref 5–15)
BUN: 7 mg/dL — ABNORMAL LOW (ref 8–23)
CO2: 27 mmol/L (ref 22–32)
Calcium: 8.5 mg/dL — ABNORMAL LOW (ref 8.9–10.3)
Chloride: 101 mmol/L (ref 98–111)
Creatinine, Ser: 0.76 mg/dL (ref 0.44–1.00)
GFR calc Af Amer: >60 mL/min (ref >60)
GFR calc non Af Amer: >60 mL/min (ref >60)
Glucose, Bld: 97 mg/dL (ref 70–99)
Potassium: 4.3 mmol/L (ref 3.5–5.1)
Sodium: 138 mmol/L (ref 135–145)

## 2019-08-10 LAB — SARS CORONAVIRUS 2 (TAT 6-24 HRS): SARS Coronavirus 2: NEGATIVE

## 2019-08-11 LAB — URINE CULTURE: Culture: >=100000 — AB

## 2019-08-12 ENCOUNTER — Telehealth: Payer: Self-pay

## 2019-08-12 NOTE — Telephone Encounter (Signed)
Post ED Visit - Positive Culture Follow-up  Culture report reviewed by antimicrobial stewardship pharmacist: [facility] Team []  Elenor Quinones, Pharm.D. []  Heide Guile, Pharm.D., BCPS AQ-ID []  Parks Neptune, Pharm.D., BCPS []  Alycia Rossetti, Pharm.D., BCPS []  Crowder, Pharm.D., BCPS, AAHIVP []  Legrand Como, Pharm.D., BCPS, AAHIVP []  Salome Arnt, PharmD, BCPS []  Johnnette Gourd, PharmD, BCPS []  Hughes Better, PharmD, BCPS []  Leeroy Cha, PharmD []  Laqueta Linden, PharmD, BCPS []  Albertina Parr, PharmD  [facility] Team []  Leodis Sias, PharmD []  Lindell Spar, PharmD []  Royetta [name], PharmD []  Graylin Shiver, Rph []  Rema Fendt) Glennon Mac, PharmD []  Arlyn Dunning, PharmD []  Netta Cedars, PharmD []  Dia Sitter, PharmD []  Leone Haven, PharmD []  Gretta Arab, PharmD []  Theodis Shove, PharmD []  Peggyann Juba, PharmD [x]  Reuel Boom, PharmD   Positive urine culture Treated with BactrimDS, organism sensitive to the same and no further patient follow-up is required at this time.  Genia Del 08/12/2019, 11:04 AM

## 2019-08-12 NOTE — Anesthesia Preprocedure Evaluation (Addendum)
Anesthesia Evaluation  Patient identified by MRN, date of birth, ID band Patient awake    Reviewed: Allergy & Precautions, Patient's Chart, lab work & pertinent test results  Airway Mallampati: II  TM Distance: >3 FB Neck ROM: Full    Dental no notable dental hx. (+) Teeth Intact, Dental Advisory Given   Pulmonary asthma , former smoker,    Pulmonary exam normal breath sounds clear to auscultation       Cardiovascular Exercise Tolerance: Good negative cardio ROS Normal cardiovascular exam Rhythm:Regular Rate:Normal     Neuro/Psych  Headaches, negative psych ROS   GI/Hepatic Neg liver ROS, GERD  Medicated,  Endo/Other  diabetes, Type 2Hypothyroidism   Renal/GU K+ 4.3 Cr 0.76     Musculoskeletal   Abdominal (+) + obese,   Peds  Hematology Hgb 12.4   Anesthesia Other Findings   Reproductive/Obstetrics                            Anesthesia Physical Anesthesia Plan  ASA: III  Anesthesia Plan: Regional   Post-op Pain Management:    Induction: Intravenous  PONV Risk Score and Plan: 2 and Treatment may vary due to age or medical condition and Ondansetron  Airway Management Planned: Nasal Cannula and Natural Airway  Additional Equipment: None  Intra-op Plan:   Post-operative Plan:   Informed Consent: I have reviewed the patients History and Physical, chart, labs and discussed the procedure including the risks, benefits and alternatives for the proposed anesthesia with the patient or authorized representative who has indicated his/her understanding and acceptance.     Dental advisory given  Plan Discussed with:   Anesthesia Plan Comments: (R supraclavicular block w Mac)       Anesthesia Quick Evaluation

## 2019-08-13 ENCOUNTER — Encounter (HOSPITAL_BASED_OUTPATIENT_CLINIC_OR_DEPARTMENT_OTHER): Payer: Self-pay | Admitting: Plastic Surgery

## 2019-08-13 ENCOUNTER — Other Ambulatory Visit: Payer: Self-pay

## 2019-08-13 ENCOUNTER — Encounter (HOSPITAL_BASED_OUTPATIENT_CLINIC_OR_DEPARTMENT_OTHER)
Admission: RE | Disposition: A | Discharge status: Home / Self Care | Payer: Self-pay | Source: Home / Self Care | Attending: Plastic Surgery

## 2019-08-13 ENCOUNTER — Ambulatory Visit (HOSPITAL_BASED_OUTPATIENT_CLINIC_OR_DEPARTMENT_OTHER)
Admission: RE | Admit: 2019-08-13 | Discharge: 2019-08-13 | Disposition: A | Discharge status: Home / Self Care | Payer: Medicare Other | Source: Home / Self Care | Attending: Plastic Surgery | Admitting: Plastic Surgery

## 2019-08-13 ENCOUNTER — Other Ambulatory Visit: Payer: Self-pay | Admitting: Plastic Surgery

## 2019-08-13 ENCOUNTER — Observation Stay (HOSPITAL_COMMUNITY)
Admission: EM | Admit: 2019-08-13 | Discharge: 2019-08-16 | Disposition: A | Discharge status: Home / Self Care | Payer: Medicare Other | Attending: Family Medicine | Admitting: Family Medicine

## 2019-08-13 ENCOUNTER — Ambulatory Visit (HOSPITAL_BASED_OUTPATIENT_CLINIC_OR_DEPARTMENT_OTHER): Payer: Medicare Other | Admitting: Anesthesiology

## 2019-08-13 DIAGNOSIS — D696 Thrombocytopenia, unspecified: Secondary | ICD-10-CM | POA: Insufficient documentation

## 2019-08-13 DIAGNOSIS — Z20822 Contact with and (suspected) exposure to covid-19: Secondary | ICD-10-CM | POA: Diagnosis not present

## 2019-08-13 DIAGNOSIS — E669 Obesity, unspecified: Secondary | ICD-10-CM | POA: Insufficient documentation

## 2019-08-13 DIAGNOSIS — W010XXA Fall on same level from slipping, tripping and stumbling without subsequent striking against object, initial encounter: Secondary | ICD-10-CM | POA: Insufficient documentation

## 2019-08-13 DIAGNOSIS — Z79899 Other long term (current) drug therapy: Secondary | ICD-10-CM | POA: Insufficient documentation

## 2019-08-13 DIAGNOSIS — Z79890 Hormone replacement therapy (postmenopausal): Secondary | ICD-10-CM | POA: Insufficient documentation

## 2019-08-13 DIAGNOSIS — F329 Major depressive disorder, single episode, unspecified: Secondary | ICD-10-CM | POA: Diagnosis not present

## 2019-08-13 DIAGNOSIS — Z7984 Long term (current) use of oral hypoglycemic drugs: Secondary | ICD-10-CM | POA: Insufficient documentation

## 2019-08-13 DIAGNOSIS — G8918 Other acute postprocedural pain: Secondary | ICD-10-CM | POA: Diagnosis not present

## 2019-08-13 DIAGNOSIS — R52 Pain, unspecified: Secondary | ICD-10-CM | POA: Diagnosis present

## 2019-08-13 DIAGNOSIS — R296 Repeated falls: Secondary | ICD-10-CM

## 2019-08-13 DIAGNOSIS — S52501A Unspecified fracture of the lower end of right radius, initial encounter for closed fracture: Secondary | ICD-10-CM | POA: Diagnosis present

## 2019-08-13 DIAGNOSIS — N390 Urinary tract infection, site not specified: Secondary | ICD-10-CM | POA: Diagnosis not present

## 2019-08-13 DIAGNOSIS — B9620 Unspecified Escherichia coli [E. coli] as the cause of diseases classified elsewhere: Secondary | ICD-10-CM | POA: Insufficient documentation

## 2019-08-13 DIAGNOSIS — S52571A Other intraarticular fracture of lower end of right radius, initial encounter for closed fracture: Secondary | ICD-10-CM | POA: Insufficient documentation

## 2019-08-13 DIAGNOSIS — Z8571 Personal history of Hodgkin lymphoma: Secondary | ICD-10-CM | POA: Insufficient documentation

## 2019-08-13 DIAGNOSIS — R5381 Other malaise: Secondary | ICD-10-CM | POA: Insufficient documentation

## 2019-08-13 DIAGNOSIS — R531 Weakness: Secondary | ICD-10-CM | POA: Insufficient documentation

## 2019-08-13 DIAGNOSIS — N3000 Acute cystitis without hematuria: Secondary | ICD-10-CM

## 2019-08-13 DIAGNOSIS — Z885 Allergy status to narcotic agent status: Secondary | ICD-10-CM | POA: Diagnosis not present

## 2019-08-13 DIAGNOSIS — Z888 Allergy status to other drugs, medicaments and biological substances status: Secondary | ICD-10-CM | POA: Insufficient documentation

## 2019-08-13 DIAGNOSIS — K219 Gastro-esophageal reflux disease without esophagitis: Secondary | ICD-10-CM | POA: Insufficient documentation

## 2019-08-13 DIAGNOSIS — Z9221 Personal history of antineoplastic chemotherapy: Secondary | ICD-10-CM | POA: Insufficient documentation

## 2019-08-13 DIAGNOSIS — Z6838 Body mass index (BMI) 38.0-38.9, adult: Secondary | ICD-10-CM | POA: Diagnosis not present

## 2019-08-13 DIAGNOSIS — Z8249 Family history of ischemic heart disease and other diseases of the circulatory system: Secondary | ICD-10-CM | POA: Insufficient documentation

## 2019-08-13 DIAGNOSIS — E039 Hypothyroidism, unspecified: Secondary | ICD-10-CM | POA: Diagnosis present

## 2019-08-13 DIAGNOSIS — E785 Hyperlipidemia, unspecified: Secondary | ICD-10-CM | POA: Diagnosis not present

## 2019-08-13 DIAGNOSIS — Z9049 Acquired absence of other specified parts of digestive tract: Secondary | ICD-10-CM | POA: Insufficient documentation

## 2019-08-13 DIAGNOSIS — G4733 Obstructive sleep apnea (adult) (pediatric): Secondary | ICD-10-CM | POA: Insufficient documentation

## 2019-08-13 DIAGNOSIS — Z9181 History of falling: Secondary | ICD-10-CM | POA: Insufficient documentation

## 2019-08-13 DIAGNOSIS — J45909 Unspecified asthma, uncomplicated: Secondary | ICD-10-CM | POA: Diagnosis present

## 2019-08-13 DIAGNOSIS — F419 Anxiety disorder, unspecified: Secondary | ICD-10-CM | POA: Diagnosis present

## 2019-08-13 DIAGNOSIS — R410 Disorientation, unspecified: Secondary | ICD-10-CM

## 2019-08-13 DIAGNOSIS — Z6837 Body mass index (BMI) 37.0-37.9, adult: Secondary | ICD-10-CM | POA: Insufficient documentation

## 2019-08-13 DIAGNOSIS — Z9481 Bone marrow transplant status: Secondary | ICD-10-CM | POA: Insufficient documentation

## 2019-08-13 DIAGNOSIS — E119 Type 2 diabetes mellitus without complications: Secondary | ICD-10-CM | POA: Insufficient documentation

## 2019-08-13 DIAGNOSIS — Z87891 Personal history of nicotine dependence: Secondary | ICD-10-CM | POA: Insufficient documentation

## 2019-08-13 DIAGNOSIS — Y939 Activity, unspecified: Secondary | ICD-10-CM | POA: Insufficient documentation

## 2019-08-13 DIAGNOSIS — R519 Headache, unspecified: Secondary | ICD-10-CM | POA: Insufficient documentation

## 2019-08-13 DIAGNOSIS — W19XXXA Unspecified fall, initial encounter: Secondary | ICD-10-CM | POA: Insufficient documentation

## 2019-08-13 DIAGNOSIS — Z7951 Long term (current) use of inhaled steroids: Secondary | ICD-10-CM | POA: Insufficient documentation

## 2019-08-13 DIAGNOSIS — Z881 Allergy status to other antibiotic agents status: Secondary | ICD-10-CM | POA: Insufficient documentation

## 2019-08-13 DIAGNOSIS — G43909 Migraine, unspecified, not intractable, without status migrainosus: Secondary | ICD-10-CM | POA: Insufficient documentation

## 2019-08-13 HISTORY — DX: Anxiety disorder, unspecified: F41.9

## 2019-08-13 HISTORY — DX: Unspecified fracture of the lower end of right radius, initial encounter for closed fracture: S52.501A

## 2019-08-13 HISTORY — PX: ORIF RADIAL FRACTURE: SHX5113

## 2019-08-13 HISTORY — DX: Gastro-esophageal reflux disease without esophagitis: K21.9

## 2019-08-13 HISTORY — DX: Hypothyroidism, unspecified: E03.9

## 2019-08-13 LAB — GLUCOSE, CAPILLARY
Glucose-Capillary: 105 mg/dL — ABNORMAL HIGH (ref 70–99)
Glucose-Capillary: 116 mg/dL — ABNORMAL HIGH (ref 70–99)

## 2019-08-13 SURGERY — OPEN REDUCTION INTERNAL FIXATION (ORIF) RADIAL FRACTURE
Anesthesia: Regional | Site: Wrist | Laterality: Right

## 2019-08-13 MED ORDER — EPHEDRINE 5 MG/ML INJ
INTRAVENOUS | Status: AC
  Filled 2019-08-13: qty 10

## 2019-08-13 MED ORDER — PROPOFOL 10 MG/ML IV BOLUS
INTRAVENOUS | Status: DC | PRN
  Administered 2019-08-13: 25 mg via INTRAVENOUS
  Administered 2019-08-13 (×2): 10 mg via INTRAVENOUS

## 2019-08-13 MED ORDER — GLYCOPYRROLATE 0.2 MG/ML IJ SOLN
INTRAMUSCULAR | Status: DC | PRN
  Administered 2019-08-13: .2 mg via INTRAVENOUS

## 2019-08-13 MED ORDER — BUPIVACAINE HCL (PF) 0.25 % IJ SOLN
INTRAMUSCULAR | Status: AC
  Filled 2019-08-13: qty 30

## 2019-08-13 MED ORDER — GLYCOPYRROLATE 0.2 MG/ML IJ SOLN
INTRAMUSCULAR | Status: AC
  Filled 2019-08-13: qty 1

## 2019-08-13 MED ORDER — PHENYLEPHRINE HCL (PRESSORS) 10 MG/ML IV SOLN
INTRAVENOUS | Status: DC | PRN
  Administered 2019-08-13: 120 ug via INTRAVENOUS
  Administered 2019-08-13: 80 ug via INTRAVENOUS
  Administered 2019-08-13: 120 ug via INTRAVENOUS
  Administered 2019-08-13 (×3): 80 ug via INTRAVENOUS
  Administered 2019-08-13 (×2): 120 ug via INTRAVENOUS

## 2019-08-13 MED ORDER — ONDANSETRON HCL 4 MG/2ML IJ SOLN
4.0000 mg | Freq: Once | INTRAMUSCULAR | Status: AC
  Administered 2019-08-14: 4 mg via INTRAVENOUS
  Filled 2019-08-13: qty 2

## 2019-08-13 MED ORDER — FENTANYL CITRATE (PF) 100 MCG/2ML IJ SOLN
50.0000 ug | INTRAMUSCULAR | Status: DC | PRN
  Administered 2019-08-13: 50 ug via INTRAVENOUS

## 2019-08-13 MED ORDER — FENTANYL CITRATE (PF) 100 MCG/2ML IJ SOLN: 25.0000 ug | INTRAMUSCULAR | Status: DC | PRN

## 2019-08-13 MED ORDER — HYDROCODONE-ACETAMINOPHEN 5-325 MG PO TABS: 1.0000 | ORAL_TABLET | Freq: Four times a day (QID) | ORAL | 0 refills | Status: DC | PRN

## 2019-08-13 MED ORDER — FENTANYL CITRATE (PF) 100 MCG/2ML IJ SOLN
INTRAMUSCULAR | Status: AC
  Filled 2019-08-13: qty 2

## 2019-08-13 MED ORDER — FENTANYL CITRATE (PF) 100 MCG/2ML IJ SOLN
INTRAMUSCULAR | Status: DC | PRN
  Administered 2019-08-13: 50 ug via INTRAVENOUS
  Administered 2019-08-13 (×2): 25 ug via INTRAVENOUS

## 2019-08-13 MED ORDER — SODIUM CHLORIDE 0.9 % IV BOLUS
500.0000 mL | Freq: Once | INTRAVENOUS | Status: AC
  Administered 2019-08-14: 500 mL via INTRAVENOUS

## 2019-08-13 MED ORDER — CLINDAMYCIN PHOSPHATE 900 MG/50ML IV SOLN
INTRAVENOUS | Status: AC
  Filled 2019-08-13: qty 50

## 2019-08-13 MED ORDER — CLONIDINE HCL (ANALGESIA) 100 MCG/ML EP SOLN
EPIDURAL | Status: DC | PRN
  Administered 2019-08-13: 100 ug

## 2019-08-13 MED ORDER — LIDOCAINE-EPINEPHRINE 1 %-1:100000 IJ SOLN
INTRAMUSCULAR | Status: DC | PRN
  Administered 2019-08-13: 10 mL

## 2019-08-13 MED ORDER — PHENYLEPHRINE 40 MCG/ML (10ML) SYRINGE FOR IV PUSH (FOR BLOOD PRESSURE SUPPORT)
PREFILLED_SYRINGE | INTRAVENOUS | Status: AC
  Filled 2019-08-13: qty 10

## 2019-08-13 MED ORDER — MIDAZOLAM HCL 2 MG/2ML IJ SOLN: 1.0000 mg | INTRAMUSCULAR | Status: DC | PRN

## 2019-08-13 MED ORDER — METOPROLOL TARTRATE 5 MG/5ML IV SOLN
INTRAVENOUS | Status: DC | PRN
  Administered 2019-08-13: 1 mg via INTRAVENOUS

## 2019-08-13 MED ORDER — ONDANSETRON HCL 4 MG/2ML IJ SOLN
INTRAMUSCULAR | Status: DC | PRN
  Administered 2019-08-13: 4 mg via INTRAVENOUS

## 2019-08-13 MED ORDER — METOPROLOL TARTRATE 5 MG/5ML IV SOLN
INTRAVENOUS | Status: AC
  Filled 2019-08-13: qty 5

## 2019-08-13 MED ORDER — CLINDAMYCIN PHOSPHATE 900 MG/50ML IV SOLN
900.0000 mg | INTRAVENOUS | Status: AC
  Administered 2019-08-13: 900 mg via INTRAVENOUS

## 2019-08-13 MED ORDER — ROPIVACAINE HCL 7.5 MG/ML IJ SOLN
INTRAMUSCULAR | Status: DC | PRN
  Administered 2019-08-13: 20 mL via PERINEURAL

## 2019-08-13 MED ORDER — PROPOFOL 500 MG/50ML IV EMUL
INTRAVENOUS | Status: DC | PRN
  Administered 2019-08-13: 25 ug/kg/min via INTRAVENOUS

## 2019-08-13 MED ORDER — LIDOCAINE HCL (CARDIAC) PF 100 MG/5ML IV SOSY
PREFILLED_SYRINGE | INTRAVENOUS | Status: DC | PRN
  Administered 2019-08-13: 60 mg via INTRATRACHEAL

## 2019-08-13 MED ORDER — ACETAMINOPHEN 10 MG/ML IV SOLN: 1000.0000 mg | Freq: Once | INTRAVENOUS | Status: DC | PRN

## 2019-08-13 MED ORDER — EPHEDRINE SULFATE 50 MG/ML IJ SOLN
INTRAMUSCULAR | Status: DC | PRN
  Administered 2019-08-13: 10 mg via INTRAVENOUS
  Administered 2019-08-13: 5 mg via INTRAVENOUS

## 2019-08-13 MED ORDER — ONDANSETRON HCL 4 MG/2ML IJ SOLN
INTRAMUSCULAR | Status: AC
  Filled 2019-08-13: qty 2

## 2019-08-13 MED ORDER — LACTATED RINGERS IV SOLN: INTRAVENOUS | Status: DC

## 2019-08-13 MED ORDER — LIDOCAINE 2% (20 MG/ML) 5 ML SYRINGE
INTRAMUSCULAR | Status: AC
  Filled 2019-08-13: qty 5

## 2019-08-13 MED ORDER — LIDOCAINE-EPINEPHRINE 1 %-1:100000 IJ SOLN
INTRAMUSCULAR | Status: AC
  Filled 2019-08-13: qty 1

## 2019-08-13 MED ORDER — ONDANSETRON HCL 4 MG/2ML IJ SOLN: 4.0000 mg | Freq: Once | INTRAMUSCULAR | Status: DC | PRN

## 2019-08-13 MED ORDER — PROPOFOL 500 MG/50ML IV EMUL
INTRAVENOUS | Status: AC
  Filled 2019-08-13: qty 50

## 2019-08-13 MED ORDER — FENTANYL CITRATE (PF) 100 MCG/2ML IJ SOLN
50.0000 ug | Freq: Once | INTRAMUSCULAR | Status: AC
  Administered 2019-08-14: 50 ug via INTRAVENOUS
  Filled 2019-08-13: qty 2

## 2019-08-13 SURGICAL SUPPLY — 77 items
BIT DRILL 2.2 SS TIBIAL (BIT) ×1 IMPLANT
BLADE SURG 15 STRL LF DISP TIS (BLADE) ×1 IMPLANT
BLADE SURG 15 STRL SS (BLADE) ×2
BNDG ELASTIC 3X5.8 VLCR STR LF (GAUZE/BANDAGES/DRESSINGS) IMPLANT
BNDG ELASTIC 4X5.8 VLCR STR LF (GAUZE/BANDAGES/DRESSINGS) IMPLANT
BNDG ESMARK 4X9 LF (GAUZE/BANDAGES/DRESSINGS) IMPLANT
BNDG GAUZE ELAST 4 BULKY (GAUZE/BANDAGES/DRESSINGS) ×1 IMPLANT
CHLORAPREP W/TINT 26 (MISCELLANEOUS) ×2 IMPLANT
CORD BIPOLAR FORCEPS 12FT (ELECTRODE) ×1 IMPLANT
COVER BACK TABLE 60X90IN (DRAPES) ×2 IMPLANT
COVER MAYO STAND STRL (DRAPES) ×2 IMPLANT
COVER WAND RF STERILE (DRAPES) IMPLANT
CUFF TOURN SGL QUICK 18X4 (TOURNIQUET CUFF) ×1 IMPLANT
DECANTER SPIKE VIAL GLASS SM (MISCELLANEOUS) IMPLANT
DRAPE EXTREMITY T 121X128X90 (DISPOSABLE) ×2 IMPLANT
DRAPE OEC MINIVIEW 54X84 (DRAPES) ×2 IMPLANT
DRAPE SURG 17X23 STRL (DRAPES) ×2 IMPLANT
DRSG PAD ABDOMINAL 8X10 ST (GAUZE/BANDAGES/DRESSINGS) IMPLANT
ELECT REM PT RETURN 9FT ADLT (ELECTROSURGICAL) ×2
ELECTRODE REM PT RTRN 9FT ADLT (ELECTROSURGICAL) IMPLANT
GAUZE SPONGE 4X4 12PLY STRL (GAUZE/BANDAGES/DRESSINGS) ×2 IMPLANT
GAUZE XEROFORM 1X8 LF (GAUZE/BANDAGES/DRESSINGS) ×2 IMPLANT
GLOVE BIO SURGEON STRL SZ7.5 (GLOVE) IMPLANT
GLOVE BIOGEL M STRL SZ7.5 (GLOVE) ×2 IMPLANT
GLOVE BIOGEL PI IND STRL 8 (GLOVE) ×1 IMPLANT
GLOVE BIOGEL PI INDICATOR 8 (GLOVE) ×1
GOWN STRL REUS W/ TWL LRG LVL3 (GOWN DISPOSABLE) ×2 IMPLANT
GOWN STRL REUS W/TWL LRG LVL3 (GOWN DISPOSABLE) ×4
K-WIRE 1.6 (WIRE) ×4
K-WIRE FX5X1.6XNS BN SS (WIRE) ×2
KWIRE FX5X1.6XNS BN SS (WIRE) IMPLANT
NDL HYPO 25X1 1.5 SAFETY (NEEDLE) IMPLANT
NDL PRECISIONGLIDE 27X1.5 (NEEDLE) IMPLANT
NEEDLE HYPO 25X1 1.5 SAFETY (NEEDLE) IMPLANT
NEEDLE PRECISIONGLIDE 27X1.5 (NEEDLE) IMPLANT
NS IRRIG 1000ML POUR BTL (IV SOLUTION) IMPLANT
PACK BASIN DAY SURGERY FS (CUSTOM PROCEDURE TRAY) ×2 IMPLANT
PAD CAST 3X4 CTTN HI CHSV (CAST SUPPLIES) IMPLANT
PAD CAST 4YDX4 CTTN HI CHSV (CAST SUPPLIES) IMPLANT
PADDING CAST COTTON 3X4 STRL (CAST SUPPLIES)
PADDING CAST COTTON 4X4 STRL (CAST SUPPLIES)
PEG LOCKING SMOOTH 2.2X16 (Screw) ×1 IMPLANT
PEG LOCKING SMOOTH 2.2X18 (Peg) ×3 IMPLANT
PLATE NARROW DVR RIGHT (Plate) ×1 IMPLANT
SCREW LOCK 12X2.7X 3 LD (Screw) IMPLANT
SCREW LOCK 14X2.7X 3 LD TPR (Screw) IMPLANT
SCREW LOCK 18X2.7X 3 LD TPR (Screw) IMPLANT
SCREW LOCKING 2.7X12MM (Screw) ×4 IMPLANT
SCREW LOCKING 2.7X13MM (Screw) ×1 IMPLANT
SCREW LOCKING 2.7X14 (Screw) ×2 IMPLANT
SCREW LOCKING 2.7X18 (Screw) ×2 IMPLANT
SCREW MULTI DIRECTIONAL 2.7X16 (Screw) ×1 IMPLANT
SLEEVE SCD COMPRESS KNEE MED (MISCELLANEOUS) ×1 IMPLANT
SLING ARM FOAM STRAP MED (SOFTGOODS) ×1 IMPLANT
SLING ARM IMMOBILIZER MED (SOFTGOODS) ×1 IMPLANT
SPLINT PLASTER CAST XFAST 3X15 (CAST SUPPLIES) IMPLANT
SPLINT PLASTER CAST XFAST 4X15 (CAST SUPPLIES) IMPLANT
SPLINT PLASTER XTRA FAST SET 4 (CAST SUPPLIES) ×1
SPLINT PLASTER XTRA FASTSET 3X (CAST SUPPLIES)
STOCKINETTE 4X48 STRL (DRAPES) ×2 IMPLANT
SUCTION FRAZIER HANDLE 10FR (MISCELLANEOUS) ×2
SUCTION TUBE FRAZIER 10FR DISP (MISCELLANEOUS) IMPLANT
SUT CHROMIC 5 0 P 3 (SUTURE) IMPLANT
SUT ETHILON 4 0 PS 2 18 (SUTURE) IMPLANT
SUT MNCRL AB 4-0 PS2 18 (SUTURE) ×1 IMPLANT
SUT PLAIN 5 0 P 3 18 (SUTURE) ×1 IMPLANT
SUT PROLENE 5 0 P 3 (SUTURE) IMPLANT
SUT SILK 4 0 PS 2 (SUTURE) IMPLANT
SUT VIC AB 4-0 P-3 18XBRD (SUTURE) IMPLANT
SUT VIC AB 4-0 P3 18 (SUTURE)
SUT VICRYL 4-0 PS2 18IN ABS (SUTURE) IMPLANT
SYR BULB 3OZ (MISCELLANEOUS) IMPLANT
SYR CONTROL 10ML LL (SYRINGE) ×1 IMPLANT
TOWEL GREEN STERILE FF (TOWEL DISPOSABLE) ×2 IMPLANT
TRAP DIGIT (INSTRUMENTS) ×1 IMPLANT
TUBE CONNECTING 20X1/4 (TUBING) ×1 IMPLANT
UNDERPAD 30X36 HEAVY ABSORB (UNDERPADS AND DIAPERS) ×2 IMPLANT

## 2019-08-13 NOTE — Op Note (Signed)
Operative Note   DATE OF OPERATION: 08/13/2019  SURGICAL DEPARTMENT: Plastic Surgery  PREOPERATIVE DIAGNOSES: Comminuted right intra-articular distal radius fracture with more than 3 fragments  POSTOPERATIVE DIAGNOSES:  same  PROCEDURE: Open reduction internal fixation comminuted right intra-articular distal radius fracture with more than 3 fragments  SURGEON: Talmadge Coventry, MD  ASSISTANT: Verdie Shire, PA The advanced practice practitioner (APP) assisted throughout the case.  The APP was essential in retraction and counter traction when needed to make the case progress smoothly.  This retraction and assistance made it possible to see the tissue plans for the procedure.  The assistance was needed for blood control, tissue re-approximation and assisted with closure of the incision site.  ANESTHESIA:  General.   COMPLICATIONS: None.   INDICATIONS FOR PROCEDURE:  The patient, Erika [name] is a 74 y.o. female born on 1945-10-15, is here for treatment of comminuted intra-articular distal radius fracture. MRN: [id number]  CONSENT:  Informed consent was obtained directly from the patient. Risks, benefits and alternatives were fully discussed. Specific risks including but not limited to bleeding, infection, hematoma, seroma, scarring, pain, contracture, asymmetry, wound healing problems, and need for further surgery were all discussed. The patient did have an ample opportunity to have questions answered to satisfaction.  We discussed nonunion malunion and hardware related complications.  DESCRIPTION OF PROCEDURE:  The patient was taken to the operating room. SCDs were placed and antibiotics were given.  Regional anesthesia was administered.  The patient's operative site was prepped and draped in a sterile fashion. A time out was performed and all information was confirmed to be correct.  The arm was exsanguinated with Esmarch.  Tourniquet was inflated to 250 mmHg.  A longitudinal incision  was made over the FCR.  I dissected down to the tendon with tenotomy scissors and released the superficial sheath.  The tendon was then retracted ulnarly.  The subsheath was then incised with scissors.  Blunt dissection was then carried out down to the pronator quadratus.  Self-retaining retractor was then placed to hold the tendons and median nerve out of the way.  The radial border of the distal radius was incised with a 15 blade and an elevator was used to elevate the pronator quadratus off the distal radius.  15 blade was then used to release the brachioradialis off the radial styloid.  Finger traps were then placed over the index and long finger and 5 pounds of weight was hung off the end of the hand table to provide traction.  The fracture was then reduced manually and reduction was confirmed with imaging.  Distal radius articular surface was in multiple fragments.  I did not note any obvious fractures of the carpal bones on multiple views.  A narrow Zimmer/Biomet DVR cross lock right sided distal radius plate was then put into the wound.  Positioning was confirmed with imaging and was secured in place with K wires.  Appropriate positioning of the plate was then confirmed.  I then drilled the oblong hole and placed a cortical screw.  The distal holes were then drilled and filled with a combination of pegs and locking screws.  Proximal remaining 2 holes were then drilled and filled with locking screws.  Appropriate reduction and hardware placement was confirmed with imaging.  Tourniquet was then let down.  Hemostasis was obtained.  Closure was done with interrupted buried Monocryl sutures and a running 5-0 plain gut.  A volar splint was then applied.  Tourniquet time was just over 60 minutes.  The patient tolerated the procedure well.  There were no complications. The patient was allowed to wake from anesthesia, extubated and taken to the recovery room in satisfactory condition.

## 2019-08-13 NOTE — Progress Notes (Signed)
Assisted Dr. Houser with right, ultrasound guided, supraclavicular block. Side rails up, monitors on throughout procedure. See vital signs in flow sheet. Tolerated Procedure well. 

## 2019-08-13 NOTE — Discharge Instructions (Addendum)
Activity As tolerated: NO showers unless you are able to completely cover your right hand/arm with a waterproof covering. You should not get this area wet. If you are unable to cover completely, avoid showers. NO driving  No heavy activities  Diet: Regular  Wound Care: Keep dressing clean & dry. Do not remove splint. Do not change dressings   Special Instructions:  Call Doctor if any unusual problems occur such as pain, excessive Bleeding, unrelieved Nausea/vomiting, Fever &/or chills  Follow-up appointment: Schedule for 2 weeks after procedure. You will need x-rays prior to your post-op follow up with Dr. Claudia Desanctis. We will order these for you.  Please call with any questions or concerns. We will also put in a referral for occupational therapy, they will assist you with returning to normal activities and range of motion with your wrist as well as fit you for a more comfortable splint.     Post Anesthesia Home Care Instructions  Activity: Get plenty of rest for the remainder of the day. A responsible individual must stay with you for 24 hours following the procedure.  For the next 24 hours, DO NOT: -Drive a car -Paediatric nurse -Drink alcoholic beverages -Take any medication unless instructed by your physician -Make any legal decisions or sign important papers.  Meals: Start with liquid foods such as gelatin or soup. Progress to regular foods as tolerated. Avoid greasy, spicy, heavy foods. If nausea and/or vomiting occur, drink only clear liquids until the nausea and/or vomiting subsides. Call your physician if vomiting continues.  Special Instructions/Symptoms: Your throat may feel dry or sore from the anesthesia or the breathing tube placed in your throat during surgery. If this causes discomfort, gargle with warm salt water. The discomfort should disappear within 24 hours.  If you had a scopolamine patch placed behind your ear for the management of post- operative nausea and/or  vomiting:  1. The medication in the patch is effective for 72 hours, after which it should be removed.  Wrap patch in a tissue and discard in the trash. Wash hands thoroughly with soap and water. 2. You may remove the patch earlier than 72 hours if you experience unpleasant side effects which may include dry mouth, dizziness or visual disturbances. 3. Avoid touching the patch. Wash your hands with soap and water after contact with the patch.    Regional Anesthesia Blocks  1. Numbness or the inability to move the "blocked" extremity may last from 3-48 hours after placement. The length of time depends on the medication injected and your individual response to the medication. If the numbness is not going away after 48 hours, call your surgeon.  2. The extremity that is blocked will need to be protected until the numbness is gone and the  Strength has returned. Because you cannot feel it, you will need to take extra care to avoid injury. Because it may be weak, you may have difficulty moving it or using it. You may not know what position it is in without looking at it while the block is in effect.  3. For blocks in the legs and feet, returning to weight bearing and walking needs to be done carefully. You will need to wait until the numbness is entirely gone and the strength has returned. You should be able to move your leg and foot normally before you try and bear weight or walk. You will need someone to be with you when you first try to ensure you do not fall and possibly  risk injury.  4. Bruising and tenderness at the needle site are common side effects and will resolve in a few days.  5. Persistent numbness or new problems with movement should be communicated to the surgeon or the [facility] [phone] [place] [phone]).

## 2019-08-13 NOTE — ED Triage Notes (Signed)
Arrived by EMS from home. Patient has surgery today on right wrist. Patient called EMS tonight because of wrist pain. Patient reports she did not take any of her prescribed pain medication since 1500 today. Patient is able to move fingers, has normal sensation in fingers, and warm to touch.

## 2019-08-13 NOTE — Transfer of Care (Signed)
Immediate Anesthesia Transfer of Care Note  Patient: Erika [name]  Procedure(s) Performed: OPEN REDUCTION INTERNAL FIXATION (ORIF) RADIAL FRACTURE (Right Wrist)  Patient Location: PACU  Anesthesia Type:MAC and Regional  Level of Consciousness: awake and patient cooperative  Airway & Oxygen Therapy: Patient Spontanous Breathing and Patient connected to face mask oxygen  Post-op Assessment: Report given to RN, Post -op Vital signs reviewed and stable and Patient able to stick tongue midline  Post vital signs: Reviewed and stable  Last Vitals:  Vitals Value Taken Time  BP 101/54 08/13/19 0925  Temp    Pulse 93 08/13/19 0927  Resp 16 08/13/19 0927  SpO2 99 % 08/13/19 0927  Vitals shown include unvalidated device data.  Last Pain:  Vitals:   08/13/19 0922  TempSrc:   PainSc: 4       Patients Stated Pain Goal: 3 (67/59/16 3846)  Complications: No apparent anesthesia complications

## 2019-08-13 NOTE — Brief Op Note (Signed)
08/13/2019  9:36 AM  PATIENT:  Erika [name]  74 y.o. female  PRE-OPERATIVE DIAGNOSIS:  Closed fracture of distal end of right radius  POST-OPERATIVE DIAGNOSIS:  Closed fracture of distal end of right radius  PROCEDURE:  Procedure(s): OPEN REDUCTION INTERNAL FIXATION (ORIF) RADIAL FRACTURE (Right)  SURGEON:  Surgeon(s) and Role:    * Alexzavier Girardin, Steffanie Dunn, MD - Primary  PHYSICIAN ASSISTANT: Software engineer, PA  ASSISTANTS: none   ANESTHESIA:   regional  EBL:  5 mL   BLOOD ADMINISTERED:none  DRAINS: none   LOCAL MEDICATIONS USED:  LIDOCAINE   SPECIMEN:  No Specimen  DISPOSITION OF SPECIMEN:  N/A  COUNTS:  YES  TOURNIQUET:   Total Tourniquet Time Documented: Upper Arm (Right) - 62 minutes Total: Upper Arm (Right) - 62 minutes   DICTATION: .Viviann Spare Dictation  PLAN OF CARE: Discharge to home after PACU  PATIENT DISPOSITION:  PACU - hemodynamically stable.   Delay start of Pharmacological VTE agent (>24hrs) due to surgical blood loss or risk of bleeding: not applicable

## 2019-08-13 NOTE — Progress Notes (Signed)
Patient is a 74 year old female status post open reduction internal fixation of right distal radial fracture by Dr. Claudia Desanctis today.   Patient called the on-call provider line at 10:22 PM Indicating they may go to ER for pain control. I called the patient at 10:28 PM and spoke with Erika [name], patient's daughter. Erika [name] reported that the patient noted the numbness from her block wore off around 7 PM, but patient did not notify family until a pproximately 930 PM, which is when she took her first dose of oxycodone. Unfortunately, oxycodone did not provide much relief for patient's pain at this point.   Per patient, she does not have any chest pain, shortness of breath (no more than normal). She is" sick to her stomach" after taking pain medication. Exposed fingertips are normal color per daughter. Arm is a normal color. She does have some swelling of her arm per patient .  Daughter reports patient does not look well, but they feel it is associated with her feeling shocked about how much pain she is In. She is eating and drinking normally.  I recommended zofran to assist with easing nausea.  If patient is unable to control pain at home and cannot tolerate the amount of pain she is in, she may need to be evaluated in the ED for pain management. Daughter and patient feel as if this is their pr eference due to the patient being significantly worried about the pain and not feeling very well.   Called patient back at 11:00 PM and spoke with daughter who stated the ambulance was at the house and patient was on the stretcher with emergency medical team. I advised them to call us back if they have any questions.

## 2019-08-13 NOTE — Anesthesia Postprocedure Evaluation (Signed)
Anesthesia Post Note  Patient: Erika [name]  Procedure(s) Performed: OPEN REDUCTION INTERNAL FIXATION (ORIF) RADIAL FRACTURE (Right Wrist)     Patient location during evaluation: PACU Anesthesia Type: Regional Level of consciousness: awake and alert Pain management: pain level controlled Vital Signs Assessment: post-procedure vital signs reviewed and stable Respiratory status: spontaneous breathing, nonlabored ventilation, respiratory function stable and patient connected to nasal cannula oxygen Cardiovascular status: stable and blood pressure returned to baseline Postop Assessment: no apparent nausea or vomiting Anesthetic complications: no    Last Vitals:  Vitals:   08/13/19 1125 08/13/19 1138  BP: 122/64   Pulse: 84   Resp: 18   Temp: 36.6 C   SpO2: 96% 97%    Last Pain:  Vitals:   08/13/19 1125  TempSrc:   PainSc: 0-No pain                 Barnet Glasgow

## 2019-08-13 NOTE — Anesthesia Procedure Notes (Addendum)
Anesthesia Regional Block: Supraclavicular block   Pre-Anesthetic Checklist: ,, timeout performed, Correct Patient, Correct Site, Correct Laterality, Correct Procedure, Correct Position, site marked, Risks and benefits discussed,  Surgical consent,  Pre-op evaluation,  At surgeon's request and post-op pain management  Laterality: Right  Prep: chloraprep       Needles:  Injection technique: Single-shot  Needle Type: Echogenic Needle     Needle Length: 5cm  Needle Gauge: 21     Additional Needles:   Procedures:,,,, ultrasound used (permanent image in chart),,,,  Narrative:  Start time: 08/13/2019 7:18 AM End time: 08/13/2019 7:24 AM Injection made incrementally with aspirations every 5 mL.  Performed by: Personally  Anesthesiologist: Barnet Glasgow, MD

## 2019-08-13 NOTE — Interval H&P Note (Signed)
History and Physical Interval Note:  08/13/2019 7:16 AM  Erika [name]  has presented today for surgery, with the diagnosis of Closed fracture of distal end of right radius.  The various methods of treatment have been discussed with the patient and family. After consideration of risks, benefits and other options for treatment, the patient has consented to  Procedure(s): OPEN REDUCTION INTERNAL FIXATION (ORIF) RADIAL FRACTURE (Right) as a surgical intervention.  The patient's history has been reviewed, patient examined, no change in status, stable for surgery.  I have reviewed the patient's chart and labs.  Questions were answered to the patient's satisfaction.     Cindra Presume

## 2019-08-14 ENCOUNTER — Encounter (HOSPITAL_COMMUNITY): Payer: Self-pay | Admitting: Family Medicine

## 2019-08-14 DIAGNOSIS — S52501A Unspecified fracture of the lower end of right radius, initial encounter for closed fracture: Secondary | ICD-10-CM | POA: Diagnosis present

## 2019-08-14 DIAGNOSIS — J45901 Unspecified asthma with (acute) exacerbation: Secondary | ICD-10-CM

## 2019-08-14 DIAGNOSIS — N3000 Acute cystitis without hematuria: Secondary | ICD-10-CM

## 2019-08-14 DIAGNOSIS — E119 Type 2 diabetes mellitus without complications: Secondary | ICD-10-CM

## 2019-08-14 DIAGNOSIS — F419 Anxiety disorder, unspecified: Secondary | ICD-10-CM

## 2019-08-14 DIAGNOSIS — E039 Hypothyroidism, unspecified: Secondary | ICD-10-CM

## 2019-08-14 DIAGNOSIS — N390 Urinary tract infection, site not specified: Secondary | ICD-10-CM | POA: Diagnosis present

## 2019-08-14 DIAGNOSIS — R52 Pain, unspecified: Secondary | ICD-10-CM | POA: Diagnosis present

## 2019-08-14 DIAGNOSIS — R296 Repeated falls: Secondary | ICD-10-CM

## 2019-08-14 LAB — URINALYSIS, ROUTINE W REFLEX MICROSCOPIC
Bilirubin Urine: NEGATIVE
Glucose, UA: NEGATIVE mg/dL
Hgb urine dipstick: NEGATIVE
Ketones, ur: 20 mg/dL — AB
Nitrite: NEGATIVE
Protein, ur: NEGATIVE mg/dL
Specific Gravity, Urine: 1.009 (ref 1.005–1.030)
pH: 8 (ref 5.0–8.0)

## 2019-08-14 LAB — CBC
HCT: 38.2 % (ref 36.0–46.0)
Hemoglobin: 11.8 g/dL — ABNORMAL LOW (ref 12.0–15.0)
MCH: 27.1 pg (ref 26.0–34.0)
MCHC: 30.9 g/dL (ref 30.0–36.0)
MCV: 87.6 fL (ref 80.0–100.0)
Platelets: UNDETERMINED 10*3/uL (ref 150–400)
RBC: 4.36 MIL/uL (ref 3.87–5.11)
RDW: 16.1 % — ABNORMAL HIGH (ref 11.5–15.5)
WBC: 14.3 10*3/uL — ABNORMAL HIGH (ref 4.0–10.5)
nRBC: 0 % (ref 0.0–0.2)

## 2019-08-14 LAB — GLUCOSE, CAPILLARY
Glucose-Capillary: 103 mg/dL — ABNORMAL HIGH (ref 70–99)
Glucose-Capillary: 125 mg/dL — ABNORMAL HIGH (ref 70–99)
Glucose-Capillary: 94 mg/dL (ref 70–99)
Glucose-Capillary: 96 mg/dL (ref 70–99)

## 2019-08-14 LAB — SARS CORONAVIRUS 2 (TAT 6-24 HRS): SARS Coronavirus 2: NEGATIVE

## 2019-08-14 LAB — DIFFERENTIAL
Abs Immature Granulocytes: 0.05 10*3/uL (ref 0.00–0.07)
Basophils Absolute: 0 10*3/uL (ref 0.0–0.1)
Basophils Relative: 0 %
Eosinophils Absolute: 0.2 10*3/uL (ref 0.0–0.5)
Eosinophils Relative: 1 %
Immature Granulocytes: 0 %
Lymphocytes Relative: 7 %
Lymphs Abs: 1 10*3/uL (ref 0.7–4.0)
Monocytes Absolute: 0.9 10*3/uL (ref 0.1–1.0)
Monocytes Relative: 7 %
Neutro Abs: 12.1 10*3/uL — ABNORMAL HIGH (ref 1.7–7.7)
Neutrophils Relative %: 85 %

## 2019-08-14 LAB — COMPREHENSIVE METABOLIC PANEL
ALT: 23 U/L (ref 0–44)
AST: 21 U/L (ref 15–41)
Albumin: 3.6 g/dL (ref 3.5–5.0)
Alkaline Phosphatase: 90 U/L (ref 38–126)
Anion gap: 11 (ref 5–15)
BUN: 9 mg/dL (ref 8–23)
CO2: 24 mmol/L (ref 22–32)
Calcium: 9.2 mg/dL (ref 8.9–10.3)
Chloride: 103 mmol/L (ref 98–111)
Creatinine, Ser: 0.71 mg/dL (ref 0.44–1.00)
GFR calc Af Amer: >60 mL/min (ref >60)
GFR calc non Af Amer: >60 mL/min (ref >60)
Glucose, Bld: 124 mg/dL — ABNORMAL HIGH (ref 70–99)
Potassium: 4 mmol/L (ref 3.5–5.1)
Sodium: 138 mmol/L (ref 135–145)
Total Bilirubin: 0.8 mg/dL (ref 0.3–1.2)
Total Protein: 7 g/dL (ref 6.5–8.1)

## 2019-08-14 MED ORDER — OXYBUTYNIN CHLORIDE ER 10 MG PO TB24
10.0000 mg | ORAL_TABLET | Freq: Every day | ORAL | Status: DC
  Administered 2019-08-14 – 2019-08-16 (×3): 10 mg via ORAL
  Filled 2019-08-14 (×5): qty 1

## 2019-08-14 MED ORDER — ESCITALOPRAM OXALATE 10 MG PO TABS
10.0000 mg | ORAL_TABLET | Freq: Every day | ORAL | Status: DC
  Administered 2019-08-14 – 2019-08-16 (×3): 10 mg via ORAL
  Filled 2019-08-14 (×3): qty 1

## 2019-08-14 MED ORDER — ONDANSETRON HCL 4 MG/2ML IJ SOLN: 4.0000 mg | Freq: Four times a day (QID) | INTRAMUSCULAR | Status: DC | PRN

## 2019-08-14 MED ORDER — MORPHINE SULFATE (PF) 2 MG/ML IV SOLN: 2.0000 mg | INTRAVENOUS | Status: DC | PRN

## 2019-08-14 MED ORDER — BUPROPION HCL ER (XL) 150 MG PO TB24
150.0000 mg | ORAL_TABLET | Freq: Every morning | ORAL | Status: DC
  Administered 2019-08-14 – 2019-08-16 (×3): 150 mg via ORAL
  Filled 2019-08-14 (×3): qty 1

## 2019-08-14 MED ORDER — MONTELUKAST SODIUM 10 MG PO TABS
10.0000 mg | ORAL_TABLET | Freq: Every day | ORAL | Status: DC
  Administered 2019-08-14 – 2019-08-15 (×2): 10 mg via ORAL
  Filled 2019-08-14 (×2): qty 1

## 2019-08-14 MED ORDER — HYDROCODONE-ACETAMINOPHEN 5-325 MG PO TABS
1.0000 | ORAL_TABLET | Freq: Four times a day (QID) | ORAL | Status: DC | PRN
  Administered 2019-08-14: 1 via ORAL
  Filled 2019-08-14: qty 1

## 2019-08-14 MED ORDER — FLUTICASONE FUROATE-VILANTEROL 200-25 MCG/INH IN AEPB
1.0000 | INHALATION_SPRAY | Freq: Every day | RESPIRATORY_TRACT | Status: DC
  Administered 2019-08-16: 1 via RESPIRATORY_TRACT
  Filled 2019-08-14: qty 28

## 2019-08-14 MED ORDER — SODIUM CHLORIDE 0.9% FLUSH: 3.0000 mL | INTRAVENOUS | Status: DC | PRN

## 2019-08-14 MED ORDER — OXYCODONE HCL 5 MG PO TABS
5.0000 mg | ORAL_TABLET | ORAL | Status: DC | PRN
  Administered 2019-08-14 – 2019-08-16 (×6): 5 mg via ORAL
  Filled 2019-08-14 (×8): qty 1

## 2019-08-14 MED ORDER — ACETAMINOPHEN 325 MG PO TABS
650.0000 mg | ORAL_TABLET | Freq: Four times a day (QID) | ORAL | Status: DC
  Administered 2019-08-14 – 2019-08-16 (×9): 650 mg via ORAL
  Filled 2019-08-14 (×9): qty 2

## 2019-08-14 MED ORDER — SODIUM CHLORIDE 0.9% FLUSH
3.0000 mL | Freq: Two times a day (BID) | INTRAVENOUS | Status: DC
  Administered 2019-08-14 – 2019-08-16 (×5): 3 mL via INTRAVENOUS

## 2019-08-14 MED ORDER — CIPROFLOXACIN IN D5W 400 MG/200ML IV SOLN
400.0000 mg | Freq: Two times a day (BID) | INTRAVENOUS | Status: DC
  Administered 2019-08-14 – 2019-08-16 (×4): 400 mg via INTRAVENOUS
  Filled 2019-08-14 (×4): qty 200

## 2019-08-14 MED ORDER — MORPHINE SULFATE (PF) 4 MG/ML IV SOLN
3.0000 mg | INTRAVENOUS | Status: DC | PRN
  Administered 2019-08-14: 3 mg via INTRAVENOUS
  Filled 2019-08-14: qty 1

## 2019-08-14 MED ORDER — INSULIN ASPART 100 UNIT/ML ~~LOC~~ SOLN
0.0000 [IU] | Freq: Three times a day (TID) | SUBCUTANEOUS | Status: DC
  Administered 2019-08-14 – 2019-08-15 (×2): 1 [IU] via SUBCUTANEOUS
  Filled 2019-08-14: qty 0.09

## 2019-08-14 MED ORDER — SODIUM CHLORIDE 0.9 % IV SOLN
250.0000 mL | INTRAVENOUS | Status: DC | PRN
  Administered 2019-08-14: 250 mL via INTRAVENOUS

## 2019-08-14 MED ORDER — CIPROFLOXACIN IN D5W 400 MG/200ML IV SOLN: 400.0000 mg | Freq: Two times a day (BID) | INTRAVENOUS | Status: DC

## 2019-08-14 MED ORDER — ACETAMINOPHEN 325 MG PO TABS: 650.0000 mg | ORAL_TABLET | Freq: Four times a day (QID) | ORAL | Status: DC | PRN

## 2019-08-14 MED ORDER — TRAMADOL HCL 50 MG PO TABS: 50.0000 mg | ORAL_TABLET | Freq: Four times a day (QID) | ORAL | Status: DC | PRN

## 2019-08-14 MED ORDER — PANTOPRAZOLE SODIUM 40 MG PO TBEC
40.0000 mg | DELAYED_RELEASE_TABLET | Freq: Every day | ORAL | Status: DC
  Administered 2019-08-14 – 2019-08-16 (×3): 40 mg via ORAL
  Filled 2019-08-14 (×3): qty 1

## 2019-08-14 MED ORDER — LEVOTHYROXINE SODIUM 100 MCG PO TABS
100.0000 ug | ORAL_TABLET | Freq: Every day | ORAL | Status: DC
  Administered 2019-08-14 – 2019-08-16 (×3): 100 ug via ORAL
  Filled 2019-08-14 (×3): qty 1

## 2019-08-14 MED ORDER — CLONAZEPAM 0.5 MG PO TABS
0.5000 mg | ORAL_TABLET | Freq: Two times a day (BID) | ORAL | Status: DC | PRN
  Administered 2019-08-15 (×2): 0.5 mg via ORAL
  Filled 2019-08-14 (×2): qty 1

## 2019-08-14 MED ORDER — ATORVASTATIN CALCIUM 20 MG PO TABS
20.0000 mg | ORAL_TABLET | Freq: Every day | ORAL | Status: DC
  Administered 2019-08-14 – 2019-08-16 (×3): 20 mg via ORAL
  Filled 2019-08-14 (×3): qty 1

## 2019-08-14 MED ORDER — LEVOFLOXACIN IN D5W 500 MG/100ML IV SOLN
500.0000 mg | Freq: Once | INTRAVENOUS | Status: AC
  Administered 2019-08-14: 500 mg via INTRAVENOUS
  Filled 2019-08-14: qty 100

## 2019-08-14 MED ORDER — FENTANYL CITRATE (PF) 100 MCG/2ML IJ SOLN
50.0000 ug | Freq: Once | INTRAMUSCULAR | Status: AC
  Administered 2019-08-14: 50 ug via INTRAVENOUS

## 2019-08-14 MED ORDER — FAMOTIDINE 20 MG PO TABS
20.0000 mg | ORAL_TABLET | Freq: Every day | ORAL | Status: DC
  Administered 2019-08-14 – 2019-08-15 (×2): 20 mg via ORAL
  Filled 2019-08-14 (×2): qty 1

## 2019-08-14 NOTE — ED Notes (Signed)
Pt lying in bed. Full monitor on. Pain meds given. Will continue to monitor.

## 2019-08-14 NOTE — ED Provider Notes (Signed)
[facility] DEPT Provider Note   CSN: [id number] Arrival date & time: 08/13/19  2316     History Chief Complaint  Patient presents with  . Wrist Pain    Erika [name] is a 74 y.o. female.  Patient to ED by EMS with right wrist pain. She states she had a recent fall with fracture and had surgery this week. She ran out of her pain medication today and the pharmacy was closed so her daughter brought her to the emergency department for pain relief.   History from [place] (daughter) who has been caring for her this week states she has been confused this week that has gotten progressively worse. She fell initially on 3/14 but has had multiple falls since that time. Her wrist surgery was today (Dr. Claudia Desanctis) and Izora Gala has been giving her pain medicine as prescribed. Tonight she fell again without known injury. Izora Gala also states her pain is her biggest complaint tonight. No known fever. She has not been vomiting.   The history is provided by the patient and a relative. No language interpreter was used.  Wrist Pain       Past Medical History:  Diagnosis Date  . Anxiety   . Asthma   . Closed fracture of right distal radius   . Diabetes mellitus without complication ([place])   . GERD (gastroesophageal reflux disease)   . Headache   . hodgkins lymphoma dx'd 07/14/09 & 05/2009   chemo comp; bone marrow transplant  . Hypothyroidism   . OSA (obstructive sleep apnea)    not using CPAP- unable to tolerate    Patient Active Problem List   Diagnosis Date Noted  . Chronic rhinitis 12/13/2013  . Morbid obesity ([place]) 12/12/2013  . Asthma, chronic 04/02/2013  . 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 BONE MARROW TRANSPLANT  2011  . CHOLECYSTECTOMY  1966  . VESICOVAGINAL FISTULA CLOSURE W/ TAH  1999     OB History   No obstetric history on file.     Family History  Problem Relation  Age of Onset  . Heart disease Father     Social History   Tobacco Use  . Smoking status: Former Smoker    Packs/day: 0.25    Years: 10.00    Pack years: 2.50    Types: Cigarettes    Quit date: 05/27/1992    Years since quitting: 27.2  . Smokeless tobacco: Never Used  Substance Use Topics  . Alcohol use: Yes    Comment: socially  . Drug use: No    Home Medications Prior to Admission medications   Medication Sig Start Date End Date Taking? Authorizing Provider  atorvastatin (LIPITOR) 20 MG tablet Take 20 mg by mouth daily.    [provider]  buPROPion (WELLBUTRIN XL) 150 MG 24 hr tablet Take 150 mg by mouth every morning. 08/09/19   [provider]  Cetirizine HCl (ZYRTEC ALLERGY PO) Take by mouth.    [provider]  clonazePAM (KLONOPIN) 0.5 MG tablet Take 0.5 mg by mouth 2 (two) times daily as needed for anxiety.    [provider]  escitalopram (LEXAPRO) 20 MG tablet Take 10 mg by mouth daily.     [provider]  esomeprazole (NEXIUM) 40 MG capsule Take 30-60 min before first meal of the day 12/13/13   Tanda Rockers, MD  famotidine (PEPCID) 20 MG tablet One at bedtime  Patient taking differently: Take 20 mg by mouth at bedtime. One at bedtime 04/02/13   Tanda Rockers, MD  fluticasone furoate-vilanterol (BREO ELLIPTA) 200-25 MCG/INH AEPB Inhale 1 puff into the lungs daily. 08/20/17   Mannam, Hart Robinsons, MD  HYDROcodone-acetaminophen (NORCO) 5-325 MG tablet Take 1 tablet by mouth every 6 (six) hours as needed for moderate pain. 08/13/19   Cindra Presume, MD  metFORMIN (GLUCOPHAGE-XR) 500 MG 24 hr tablet Take 500 mg by mouth in the morning and at bedtime.  07/11/17   [provider]  montelukast (SINGULAIR) 10 MG tablet Take 10 mg by mouth at bedtime.    [provider]  nystatin (MYCOSTATIN) 100000 UNIT/ML suspension Take 4 mLs by mouth 4 (four) times daily as needed for itching. 07/12/19   [provider]    nystatin cream (MYCOSTATIN) Apply 1 application topically 2 (two) times daily as needed for itching. 07/07/19   [provider]  oxybutynin (DITROPAN-XL) 10 MG 24 hr tablet Take 10 mg by mouth daily. 05/17/19   [provider]  oxyCODONE-acetaminophen (PERCOCET/ROXICET) 5-325 MG tablet Take 1 tablet by mouth every 6 (six) hours as needed for severe pain. 08/08/19   Volanda Napoleon, PA-C  sulfamethoxazole-trimethoprim (BACTRIM DS) 800-160 MG tablet Take 1 tablet by mouth 2 (two) times daily for 7 days. 08/09/19 08/16/19  Caccavale, Sophia, PA-C  SUMAtriptan (IMITREX) 50 MG tablet Take 50 mg by mouth every 2 (two) hours as needed for migraine. May repeat in 2 hours if headache persists or recurs.    [provider]  SYNTHROID 100 MCG tablet Take 100 mcg by mouth daily before breakfast.  07/17/17   [provider]    Allergies    Cephalexin, Sudafed [pseudoephedrine hcl], and Pseudoephedrine hcl  Review of Systems   Review of Systems  Unable to perform ROS: Other (Acute confusion)    Physical Exam Updated Vital Signs BP (!) 141/79 (BP Location: Left Arm)   Pulse 92   Temp 98.3 F (36.8 C) (Oral)   Resp 18   Ht 4\' 10"  (1.473 m)   Wt 79.8 kg   SpO2 96%   BMI 36.78 kg/m   Physical Exam Constitutional:      Appearance: Normal appearance.     Comments: Uncomfortable appearing.  HENT:     Head: Normocephalic and atraumatic.  Cardiovascular:     Rate and Rhythm: Normal rate and regular rhythm.     Heart sounds: No murmur.  Pulmonary:     Effort: Pulmonary effort is normal.     Breath sounds: No wheezing, rhonchi or rales.  Abdominal:     Palpations: Abdomen is soft.     Tenderness: There is no abdominal tenderness.  Musculoskeletal:     Cervical back: Normal range of motion and neck supple.     Comments: Right UE splinted and in sling. No swelling of fingers of right hand. Cap RF >2s. She moves all fingers.   Skin:    General: Skin is warm and  dry.  Neurological:     Mental Status: She is alert.     Comments: The patient believes her daughter brought her to the ED, however, she arrived via EMS. She did not remember having surgery today. She complains only of sharp pain in her right wrist.      ED Results / Procedures / Treatments   Labs (all labs ordered are listed, but only abnormal results are displayed) Labs Reviewed  URINE CULTURE  CBC WITH  DIFFERENTIAL/PLATELET  COMPREHENSIVE METABOLIC PANEL  URINALYSIS, ROUTINE W REFLEX MICROSCOPIC    EKG None  Radiology No results found.  Procedures Procedures (including critical care time)  Medications Ordered in ED Medications  sodium chloride 0.9 % bolus 500 mL (has no administration in time range)  ondansetron (ZOFRAN) injection 4 mg (has no administration in time range)  fentaNYL (SUBLIMAZE) injection 50 mcg (has no administration in time range)    ED Course  I have reviewed the triage vital signs and the nursing notes.  Pertinent labs & imaging results that were available during my care of the patient were reviewed by me and considered in my medical decision making (see chart for details).    MDM Rules/Calculators/A&P                      Patient to ED by EMS. Presentation as detailed in the HPI.   Per chart review, the patient was seen initially on Sun day 3/14 after mechanical fall resulting in right Col[place] fx. She was seen again on 3/15 following multiple additional falls. Consider pain medication as possible source of increased instability. However, she was diagnosed with UTI as well. She was started on Septra. It is unclear whether she is taking this medication. No reported vomiting. Her surgery was 3/19 after which she was discharged home. Per Izora Gala, daughter and caregiver, she has had uncontrolled, severe pain since arriving back at home despite taking regular pain medication. Confusion has been worse tonight. Izora Gala does not feel at this point she can  physically care for her at home.   Patient is placed in a room and IV Fentanyl provided (2 x 50 mcg) and is comfortable. Labs pending for admission.  Leukocytosis of 14. UA still pending, however, given previous positive UA and unknown treatment status, IV Levaquin provided.   Discussed the patient with Dr. Myna Hidalgo, [facility], who accepts for admission. COVID pending. Hospital bed requested for increased comfort while waiting on room assignment.    Final Clinical Impression(s) / ED Diagnoses Final diagnoses:  None   1. Post-operative wrist pain 2. UTI 3. Confusion  Rx / DC Orders ED Discharge Orders    None       Dennie Bible [id number]    [place] [id number]    Delora Fuel, [place] [id number] [phone]

## 2019-08-14 NOTE — ED Notes (Signed)
Pt lying in bed. Awakens easily. Full monitor on. Pt denies any needs. Will continue to monitor.

## 2019-08-14 NOTE — H&P (Signed)
History and Physical    Erika [name] [id number] DOB: 25-Nov-1945 DOA: 08/13/2019  PCP: Harlan Stains, MD   Patient coming from: Home   Chief Complaint: Uncontrolled pain   HPI: Erika [name] is a 74 y.o. female with medical history significant for asthma, hypothyroidism, OSA, and type 2 diabetes mellitus, depression, anxiety, now presenting to the ED with severe right wrist pain.  Patient reports that she fell approximately 5 days ago, describing a trip on some steps, suffered a distal right radius fracture at that time, underwent ORIF yesterday, but has been unable to control her pain at home.  She contacted her surgeon's office and came to the ED for evaluation.  She was recently started on antibiotics for UTI and reports resolution in her urinary symptoms.  She denies fevers or chills.  She denies chest pain, cough, or shortness of breath.  She reports severe pain involving the right wrist.  She has had multiple falls since the recent wrist fracture, denies hitting her head or suffering any significant injury with these.  ED Course: Upon arrival to the ED, patient is found to be afebrile, saturating well on room air, and with remaining vitals stable.  Chemistry panel unremarkable and CBC notable for clumped platelets and leukocytosis.  Patient was treated with fentanyl, Levaquin, Zofran, and IV fluids in the emergency department and hospitalist were asked to admit.  Review of Systems:  All other systems reviewed and apart from HPI, are negative.  Past Medical History:  Diagnosis Date  . Anxiety   . Asthma   . Closed fracture of right distal radius   . Diabetes mellitus without complication ([place])   . GERD (gastroesophageal reflux disease)   . Headache   . hodgkins lymphoma dx'd 07/14/09 & 05/2009   chemo comp; bone marrow transplant  . Hypothyroidism   . OSA (obstructive sleep apnea)    not using CPAP- unable to tolerate    Past Surgical History:  Procedure Laterality Date    . BONE MARROW TRANSPLANT  2011  . CHOLECYSTECTOMY  1966  . VESICOVAGINAL FISTULA CLOSURE W/ TAH  1999     reports that she quit smoking about 27 years ago. Her smoking use included cigarettes. She has a 2.50 pack-year smoking history. She has never used smokeless tobacco. She reports current alcohol use. She reports that she does not use drugs.  Allergies  Allergen Reactions  . Cephalexin   . Sudafed [Pseudoephedrine Hcl] Itching and Swelling  . Pseudoephedrine Hcl Rash    Family History  Problem Relation Age of Onset  . Heart disease Father      Prior to Admission medications   Medication Sig Start Date End Date Taking? Authorizing Provider  atorvastatin (LIPITOR) 20 MG tablet Take 20 mg by mouth daily.   Yes [provider]  BELSOMRA 10 MG TABS Take 10 mg by mouth at bedtime as needed (sleep).  08/12/19  Yes [provider]  buPROPion (WELLBUTRIN XL) 150 MG 24 hr tablet Take 150 mg by mouth every morning. 08/09/19  Yes [provider]  Cetirizine HCl (ZYRTEC ALLERGY PO) Take 1 tablet by mouth daily.    Yes [provider]  escitalopram (LEXAPRO) 20 MG tablet Take 10 mg by mouth daily.    Yes [provider]  esomeprazole (NEXIUM) 40 MG capsule Take 30-60 min before first meal of the day Patient taking differently: Take 40 mg by mouth daily. Take 30-60 min before first meal of the day 12/13/13  Yes Tanda Rockers, MD  famotidine (PEPCID) 20 MG tablet One at bedtime Patient taking differently: Take 20 mg by mouth at bedtime. One at bedtime 04/02/13  Yes Tanda Rockers, MD  fluticasone furoate-vilanterol (BREO ELLIPTA) 200-25 MCG/INH AEPB Inhale 1 puff into the lungs daily. 08/20/17  Yes Mannam, Praveen, MD  HYDROcodone-acetaminophen (NORCO) 5-325 MG tablet Take 1 tablet by mouth every 6 (six) hours as needed for moderate pain. 08/13/19  Yes Cindra Presume, MD  metFORMIN (GLUCOPHAGE-XR) 500 MG 24 hr tablet Take 500 mg by mouth in the morning  and at bedtime.  07/11/17  Yes [provider]  montelukast (SINGULAIR) 10 MG tablet Take 10 mg by mouth at bedtime.   Yes [provider]  nystatin (MYCOSTATIN) 100000 UNIT/ML suspension Take 4 mLs by mouth 4 (four) times daily as needed for itching. 07/12/19  Yes [provider]  nystatin cream (MYCOSTATIN) Apply 1 application topically 2 (two) times daily as needed for itching. 07/07/19  Yes [provider]  oxybutynin (DITROPAN-XL) 10 MG 24 hr tablet Take 10 mg by mouth daily. 05/17/19  Yes [provider]  oxyCODONE-acetaminophen (PERCOCET/ROXICET) 5-325 MG tablet Take 1 tablet by mouth every 6 (six) hours as needed for severe pain. 08/08/19  Yes Providence Lanius A, PA-C  sulfamethoxazole-trimethoprim (BACTRIM DS) 800-160 MG tablet Take 1 tablet by mouth 2 (two) times daily for 7 days. 08/09/19 08/16/19 Yes Caccavale, Sophia, PA-C  SUMAtriptan (IMITREX) 50 MG tablet Take 50 mg by mouth every 2 (two) hours as needed for migraine. May repeat in 2 hours if headache persists or recurs.   Yes [provider]  SYNTHROID 100 MCG tablet Take 100 mcg by mouth daily before breakfast.  07/17/17  Yes [provider]  clonazePAM (KLONOPIN) 0.5 MG tablet Take 0.5 mg by mouth 2 (two) times daily as needed for anxiety.    [provider]    Physical Exam: Vitals:   08/14/19 0330 08/14/19 0402 08/14/19 0415 08/14/19 0430  BP: (!) 144/69 135/68  (!) 143/67  Pulse: 83 88 84 85  Resp:  17 17 20   Temp:  98.2 F (36.8 C)    TempSrc:  Oral    SpO2: 94% 97% 96% 96%  Weight:      Height:        Constitutional: NAD, anxious  Eyes: PERTLA, lids and conjunctivae normal ENMT: Mucous membranes are moist. Posterior pharynx clear of any exudate or lesions.   Neck: normal, supple, no masses, no thyromegaly Respiratory:  no wheezing, no crackles. No accessory muscle use.  Cardiovascular: S1 & S2 heard, regular rate and rhythm. No extremity edema.     Abdomen: No distension, no tenderness, soft. Bowel sounds active.  Musculoskeletal: no clubbing / cyanosis. Distal RUE immobilized with sling and neurovascularly intact.   Skin: no significant rashes, lesions, ulcers. Warm, dry, well-perfused. Neurologic: CN 2-12 grossly intact. Sensation intact. Moving all extremites.  Psychiatric: Alert and oriented to person, place, and situation. Anxious, cooperative.    Labs and Imaging on Admission: I have personally reviewed following labs and imaging studies  CBC: Recent Labs  Lab 08/09/19 2027 08/14/19 0230  WBC 14.0* 14.3*  NEUTROABS  --  12.1*  HGB 12.4 11.8*  HCT 40.3 38.2  MCV 89.2 87.6  PLT PLATELET CLUMPS NOTED ON SMEAR, UNABLE TO ESTIMATE PLATELET CLUMPS NOTED ON SMEAR, UNABLE TO ESTIMATE   Basic Metabolic Panel: Recent Labs  Lab 08/09/19 2027 08/10/19 1320 08/14/19 0035  NA 139 138  138  K 3.8 4.3 4.0  CL 104 101 103  CO2 28 27 24   GLUCOSE 177* 97 124*  BUN 9 7* 9  CREATININE 0.81 0.76 0.71  CALCIUM 8.0* 8.5* 9.2   GFR: Estimated Creatinine Clearance: 55 mL/min (by C-G formula based on SCr of 0.71 mg/dL). Liver Function Tests: Recent Labs  Lab 08/14/19 0035  AST 21  ALT 23  ALKPHOS 90  BILITOT 0.8  PROT 7.0  ALBUMIN 3.6   No results for input(s): LIPASE, AMYLASE in the last 168 hours. No results for input(s): AMMONIA in the last 168 hours. Coagulation Profile: No results for input(s): INR, PROTIME in the last 168 hours. Cardiac Enzymes: No results for input(s): CKTOTAL, CKMB, CKMBINDEX, TROPONINI in the last 168 hours. BNP (last 3 results) No results for input(s): PROBNP in the last 8760 hours. HbA1C: No results for input(s): HGBA1C in the last 72 hours. CBG: Recent Labs  Lab 08/13/19 0658 08/13/19 0930  GLUCAP 105* 116*   Lipid Profile: No results for input(s): CHOL, HDL, LDLCALC, TRIG, CHOLHDL, LDLDIRECT in the last 72 hours. Thyroid Function Tests: No results for input(s): TSH, T4TOTAL, FREET4,  T3FREE, THYROIDAB in the last 72 hours. Anemia Panel: No results for input(s): VITAMINB12, FOLATE, FERRITIN, TIBC, IRON, RETICCTPCT in the last 72 hours. Urine analysis:    Component Value Date/Time   COLORURINE YELLOW 08/14/2019 0402   APPEARANCEUR CLEAR 08/14/2019 0402   LABSPEC 1.009 08/14/2019 0402   LABSPEC 1.025 06/27/2009 0850   PHURINE 8.0 08/14/2019 0402   GLUCOSEU NEGATIVE 08/14/2019 0402   HGBUR NEGATIVE 08/14/2019 0402   BILIRUBINUR NEGATIVE 08/14/2019 0402   BILIRUBINUR Negative 06/27/2009 0850   KETONESUR 20 (A) 08/14/2019 0402   PROTEINUR NEGATIVE 08/14/2019 0402   UROBILINOGEN 1.0 07/09/2009 1822   NITRITE NEGATIVE 08/14/2019 0402   LEUKOCYTESUR TRACE (A) 08/14/2019 0402   LEUKOCYTESUR Trace 06/27/2009 0850   Sepsis Labs: @LABRCNTIP (procalcitonin:4,lacticidven:4) ) Recent Results (from the past 240 hour(s))  Urine culture     Status: Abnormal   Collection Time: 08/09/19 10:36 PM   Specimen: Urine, Catheterized  Result Value Ref Range Status   Specimen Description   Final    URINE, CATHETERIZED Performed at [facility], [place] [address] [place], [place] [address]    Special Requests   Final    NONE Performed at [facility], [place] [address] [place], [place] [address]    Culture >=100,000 COLONIES/mL ESCHERICHIA COLI (A)  Final   Report Status 08/11/2019 FINAL  Final   Organism ID, Bacteria ESCHERICHIA COLI (A)  Final      Susceptibility   Escherichia coli - MIC*    AMPICILLIN >=32 RESISTANT Resistant     CEFAZOLIN <=4 SENSITIVE Sensitive     CEFTRIAXONE <=0.25 SENSITIVE Sensitive     CIPROFLOXACIN <=0.25 SENSITIVE Sensitive     GENTAMICIN 2 SENSITIVE Sensitive     IMIPENEM <=0.25 SENSITIVE Sensitive     NITROFURANTOIN <=16 SENSITIVE Sensitive     TRIMETH/SULFA <=20 SENSITIVE Sensitive     AMPICILLIN/SULBACTAM 16 INTERMEDIATE Intermediate     PIP/TAZO <=4 SENSITIVE Sensitive     * >=100,000 COLONIES/mL  ESCHERICHIA COLI  SARS CORONAVIRUS 2 (TAT 6-24 HRS) Nasopharyngeal Nasopharyngeal Swab     Status: None   Collection Time: 08/10/19  2:24 PM   Specimen: Nasopharyngeal Swab  Result Value Ref Range Status   SARS Coronavirus 2 NEGATIVE NEGATIVE Final    Comment: (NOTE) SARS-CoV-2 target nucleic acids are NOT DETECTED. The SARS-CoV-2  RNA is generally detectable in upper and lower respiratory specimens during the acute phase of infection. Negative results do not preclude SARS-CoV-2 infection, do not rule out co-infections with other pathogens, and should not be used as the sole basis for treatment or other patient management decisions. Negative results must be combined with clinical observations, patient history, and epidemiological information. The expected result is Negative. Fact Sheet for Patients: SugarRoll.be Fact Sheet for Healthcare Providers: https://www.woods-mathews.com/ This test is not yet approved or cleared by the [place] FDA and  has been authorized for detection and/or diagnosis of SARS-CoV-2 by FDA under an Emergency Use Authorization (EUA). This EUA will remain  in effect (meaning this test can be used) for the duration of the COVID-19 declaration under Section 56 4(b)(1) of the Act, 21 U.S.C. section 360bbb-3(b)(1), unless the authorization is terminated or revoked sooner. Performed at [facility], [place] [address], [place], [place] [address]      Radiological Exams on Admission: No results found.  Assessment/Plan   1. Intractable pain   - Presents with uncontrolled right wrist pain after ORIF of right distal radius yesterday; surgeon aware  - Neurovascularly intact, will continue pain-control and transition to oral agents when tolerated    2. UTI  - Recent culture with E coli  - Continue antibiotics    3. Asthma  - No cough or wheeze in ED  - Continue ICS/LABA, Singulair    4. Depression, anxiety  -  Continue Wellbutrin, Lexapro, and as-needed Klonopin   5. Frequent falls  - Patient with recurrent falls recently  - No focal neurologic deficits, possibly due to medications and/or UTI  - Fall precautions  - Consult PT    DVT prophylaxis: SCD Code Status: Full  Family Communication: Discussed with patient  Disposition Plan: Likely home pending PT assessment and pain-control with oral medications  Consults called: None  Admission status: Observation     Vianne Bulls, MD [facility] Pager: See [email or web address]  If 7AM-7PM, please contact the daytime attending [email or web address]  08/14/2019, 5:42 AM

## 2019-08-14 NOTE — Evaluation (Signed)
Physical Therapy Evaluation Patient Details Name: Erika [name] MRN: [id number] DOB: 1946/01/22 Today's Date: 08/14/2019   History of Present Illness  74 year old female status post open reduction internal fixation of right distal radial fracture by Dr. Claudia Desanctis 08/13/19 and admitted with increased pain at home following surgery.  Clinical Impression  Pt admitted with above diagnosis.  Pt currently with functional limitations due to the deficits listed below (see PT Problem List). Pt will benefit from skilled PT to increase their independence and safety with mobility to allow discharge to the venue listed below.   Pt assisted with ambulating however only tolerated short distance and very unsteady.  Pt with short quick steps and uncontrolled forward pitch at times requiring assist.  Recommended pt discuss changes in gait with PCP.  Pt would benefit from further PT for improving mobility and balance.  Pt prefers d/c home however considering SNF.  Recommended SNF due to falls at home and now requiring more assist.  Pt states, "I don't know how I'll even shower."    Follow Up Recommendations SNF;Supervision for mobility/OOB(pt considering SNF vs HHPT, recommended SNF for safety)    Equipment Recommendations  None recommended by PT    Recommendations for Other Services       Precautions / Restrictions Precautions Precautions: Fall Required Braces or Orthoses: Sling Restrictions Other Position/Activity Restrictions: pt kept NWB due to R Wrist ORIF      Mobility  Bed Mobility Overal bed mobility: Needs Assistance Bed Mobility: Supine to Sit     Supine to sit: Min guard;HOB elevated     General bed mobility comments: increased time and effort  Transfers Overall transfer level: Needs assistance Equipment used: None Transfers: Sit to/from Stand Sit to Stand: Min assist         General transfer comment: assist to rise and steady  Ambulation/Gait Ambulation/Gait assistance: Min  assist Gait Distance (Feet): 24 Feet Assistive device: 1 person hand held assist Gait Pattern/deviations: Shuffle;Step-through pattern;Decreased stride length Gait velocity: variable   General Gait Details: pt with short steps and occasionally quick uncontrolled pace with forward pitch requiring assist for stabilizing, pt reports this isn't new but she cannot recall how long  Stairs            Wheelchair Mobility    Modified Rankin (Stroke Patients Only)       Balance Overall balance assessment: Mild deficits observed, not formally tested;History of Falls                                           Pertinent Vitals/Pain Pain Assessment: 0-10 Pain Score: 5  Pain Location: right wrist Pain Descriptors / Indicators: Sharp;Aching Pain Intervention(s): Repositioned;Monitored during session    [place] expects to be discharged to:: Private residence Living Arrangements: Spouse/significant other   Type of Home: [place]: One level Home Equipment: [place] - single point      Prior Function Level of Independence: Independent with assistive device(s)         Comments: typically independent, was using SPC and walls at home after wrist surgery     Hand Dominance        Extremity/Trunk Assessment   Upper Extremity Assessment Upper Extremity Assessment: RUE deficits/detail RUE Deficits / Details: maintained sling    Lower Extremity Assessment Lower Extremity Assessment: Generalized weakness  Communication   Communication: No difficulties  Cognition Arousal/Alertness: Awake/alert Behavior During Therapy: WFL for tasks assessed/performed Overall Cognitive Status: Within Functional Limits for tasks assessed                                        General Comments      Exercises     Assessment/Plan    PT Assessment Patient needs continued PT services  PT Problem List Decreased  strength;Decreased mobility;Decreased activity tolerance;Decreased balance;Decreased knowledge of use of DME;Pain;Decreased coordination       PT Treatment Interventions DME instruction;Gait training;Functional mobility training;Therapeutic activities;Patient/family education;Balance training;Therapeutic exercise;Neuromuscular re-education    PT Goals (Current goals can be found in the Care Plan section)  Acute Rehab PT Goals PT Goal Formulation: With patient Time For Goal Achievement: 08/21/19 Potential to Achieve Goals: Good    Frequency     Barriers to discharge        Co-evaluation               AM-PAC PT "6 Clicks" Mobility  Outcome Measure Help needed turning from your back to your side while in a flat bed without using bedrails?: A Little Help needed moving from lying on your back to sitting on the side of a flat bed without using bedrails?: A Little Help needed moving to and from a bed to a chair (including a wheelchair)?: A Little Help needed standing up from a chair using your arms (e.g., wheelchair or bedside chair)?: A Little Help needed to walk in hospital room?: A Little Help needed climbing 3-5 steps with a railing? : A Lot 6 Click Score: 17    End of Session Equipment Utilized During Treatment: Gait belt Activity Tolerance: Patient tolerated treatment well Patient left: in chair;with call bell/phone within reach;with family/visitor present   PT Visit Diagnosis: Other abnormalities of gait and mobility (R26.89);History of falling (Z91.81);Unsteadiness on feet (R26.81)    Time: [id number] PT Time Calculation (min) (ACUTE ONLY): 23 min   Charges:   PT Evaluation $PT Eval Low Complexity: 1 Low         Kati PT, DPT Acute Rehabilitation Services Office: [phone]  Trena Platt 08/14/2019, 12:47 PM

## 2019-08-14 NOTE — Progress Notes (Signed)
Pharmacy Antibiotic Note  Erika [name] is a 74 y.o. female admitted on 08/13/2019 with UTI.  Pharmacy has been consulted for cipro dosing.  Plan: Cipro 400 mg IV q12h Will start later tonight since had Levaquin 500 mg x1 3/20 at 0421 F/u scr/cultures  Height: 4\' 10"  (147.3 cm) Weight: 176 lb (79.8 kg) IBW/kg (Calculated) : 40.9  Temp (24hrs), Avg:98 F (36.7 C), Min:97.3 F (36.3 C), Max:98.3 F (36.8 C)  Recent Labs  Lab 08/09/19 2027 08/10/19 1320 08/14/19 0035 08/14/19 0230  WBC 14.0*  --   --  14.3*  CREATININE 0.81 0.76 0.71  --     Estimated Creatinine Clearance: 55 mL/min (by C-G formula based on SCr of 0.71 mg/dL).    Allergies  Allergen Reactions  . Cephalexin   . Sudafed [Pseudoephedrine Hcl] Itching and Swelling  . Pseudoephedrine Hcl Rash    Antimicrobials this admission: 3/20 levaquin >> x1 ED 3/20 cipro >>   Dose adjustments this admission:   Microbiology results:  BCx:   UCx:    Sputum:     MRSA PCR: Thank you for allowing pharmacy to be a part of this patient's care.  Dorrene German 08/14/2019 5:45 AM

## 2019-08-14 NOTE — NC FL2 (Signed)
[place] LEVEL OF CARE SCREENING TOOL     IDENTIFICATION  Patient Name: Erika [name] Birthdate: [birth year] Sex: female Admission Date (Current Location): 08/13/2019  [facility] and [place] Number:  Herbalist and Address:  [facility],  [place], [place]      Provider Number: [id number]  Attending Physician Name and Address:  Vance Gather, MD  Relative Name and Phone Number:   Georgeann Oppenheim [phone]     Current Level of Care: Hospital Recommended Level of Care: [place] Prior Approval Number:    Date Approved/Denied:   PASRR Number: [id number]  Discharge Plan: SNF    Current Diagnoses: Patient Active Problem List   Diagnosis Date Noted  . Intractable pain 08/14/2019  . Acute lower UTI 08/14/2019  . Anxiety 08/14/2019  . Diabetes mellitus type II, non insulin dependent ([place]) 08/14/2019  . Hypothyroidism   . Closed fracture of right distal radius   . Frequent falls   . Acute cystitis without hematuria   . Chronic rhinitis 12/13/2013  . Morbid obesity ([place]) 12/12/2013  . Asthma, chronic 04/02/2013  . 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[place]) 06/11/2011    Orientation RESPIRATION BLADDER Height & Weight     Self, Situation, Place  Normal External catheter Weight: 82.6 kg Height:  4\' 10"  (147.3 cm)  BEHAVIORAL SYMPTOMS/MOOD NEUROLOGICAL BOWEL NUTRITION STATUS  (none) (none) Incontinent Diet(see d/c summary)  AMBULATORY STATUS COMMUNICATION OF NEEDS Skin   Extensive Assist Verbally Surgical wounds(R wrist)                       Personal Care Assistance Level of Assistance  Bathing, Feeding, Dressing Bathing Assistance: Maximum assistance Feeding assistance: Independent Dressing Assistance: Limited assistance     Functional Limitations Info  Sight, Hearing, Speech Sight Info: Adequate Hearing Info: Adequate Speech Info: Adequate    SPECIAL CARE FACTORS FREQUENCY  PT (By  licensed PT), OT (By licensed OT)     PT Frequency: 5X/W OT Frequency: 5X/W            Contractures Contractures Info: Not present    Additional Factors Info  Code Status, Allergies Code Status Info: full Allergies Info: Cephalexin, Sudafed, Pseudoephredrine           Current Medications (08/14/2019):  This is the current hospital active medication list Current Facility-Administered Medications  Medication Dose Route Frequency Provider Last Rate Last Admin  . 0.9 %  sodium chloride infusion  250 mL Intravenous PRN Opyd, Ilene Qua, MD      . acetaminophen (TYLENOL) tablet 650 mg  650 mg Oral Q6H Patrecia Pour, MD   650 mg at 08/14/19 1252  . atorvastatin (LIPITOR) tablet 20 mg  20 mg Oral Daily Opyd, Ilene Qua, MD   20 mg at 08/14/19 1247  . buPROPion (WELLBUTRIN XL) 24 hr tablet 150 mg  150 mg Oral q morning - 10a Opyd, Ilene Qua, MD   150 mg at 08/14/19 1246  . ciprofloxacin (CIPRO) IVPB 400 mg  400 mg Intravenous Q12H Vance Gather B, MD      . clonazePAM (KLONOPIN) tablet 0.5 mg  0.5 mg Oral BID PRN Opyd, Ilene Qua, MD      . escitalopram (LEXAPRO) tablet 10 mg  10 mg Oral Daily Opyd, Ilene Qua, MD   10 mg at 08/14/19 1246  . famotidine (PEPCID) tablet 20 mg  20 mg Oral QHS Opyd, Ilene Qua, MD      .  fluticasone furoate-vilanterol (BREO ELLIPTA) 200-25 MCG/INH 1 puff  1 puff Inhalation Daily Opyd, Timothy S, MD      . insulin aspart (novoLOG) injection 0-9 Units  0-9 Units Subcutaneous TID WC Opyd, Ilene Qua, MD   1 Units at 08/14/19 1522  . levothyroxine (SYNTHROID) tablet 100 mcg  100 mcg Oral QAC breakfast Opyd, Ilene Qua, MD   100 mcg at 08/14/19 1257  . montelukast (SINGULAIR) tablet 10 mg  10 mg Oral QHS Opyd, Ilene Qua, MD      . morphine 2 MG/ML injection 2 mg  2 mg Intravenous Q4H PRN Patrecia Pour, MD      . ondansetron (ZOFRAN) injection 4 mg  4 mg Intravenous Q6H PRN Opyd, Ilene Qua, MD      . oxybutynin (DITROPAN-XL) 24 hr tablet 10 mg  10 mg Oral Daily Opyd,  Ilene Qua, MD   10 mg at 08/14/19 1246  . oxyCODONE (Oxy IR/ROXICODONE) immediate release tablet 5 mg  5 mg Oral Q4H PRN Patrecia Pour, MD   5 mg at 08/14/19 1527  . pantoprazole (PROTONIX) EC tablet 40 mg  40 mg Oral Daily Opyd, Ilene Qua, MD   40 mg at 08/14/19 1246  . sodium chloride flush (NS) 0.9 % injection 3 mL  3 mL Intravenous Q12H Opyd, Timothy S, MD      . sodium chloride flush (NS) 0.9 % injection 3 mL  3 mL Intravenous PRN Opyd, Ilene Qua, MD      . traMADol (ULTRAM) tablet 50 mg  50 mg Oral Q6H PRN Patrecia Pour, MD         Discharge Medications: Please see discharge summary for a list of discharge medications.  Relevant Imaging Results:  Relevant Lab Results:   Additional Information [place], [place]

## 2019-08-14 NOTE — Progress Notes (Signed)
PROGRESS NOTE  Brief Narrative: Erika [name] is a 74 y.o. female with a history of T2DM, OSA, depression, anxiety, asthma, hypothyroidism, and frequent falls at home with recent fall resulting in right distal radius fracture s/p ORIF on 3/19 who presented to the ED pater that night due to uncontrolled pain. There is evidence of UTI with recent culture growing E. coli. She was admitted for intractable pain.  Subjective: Pain better controlled, using mostly oral medications. No numbness. Lives with husband who is himself dependent for mobility on rollator.  Objective: BP (!) 136/116 (BP Location: Left Leg)   Pulse 86   Temp 98.5 F (36.9 C) (Oral)   Resp 18   Ht 4\' 10"  (1.473 m)   Wt 82.6 kg   SpO2 95%   BMI 38.06 kg/m   Gen: No distress Pulm: Clear and nonlabored  CV: RRR, no murmur, no JVD, no edema GI: Soft, NT, ND, +BS  Ext: RUE with brisk cap refill in digits, able to move all fingers, no numbness, warm.  Neuro: Alert and oriented, intermittent phrases that don't make complete sense. Daughter at bedside says this has been the case for weeks-months. No focal deficits.  Assessment & Plan: Principal Problem:   Intractable pain Active Problems:   Asthma, chronic   Acute lower UTI   Anxiety   Diabetes mellitus type II, non insulin dependent (HCC)   Hypothyroidism   Closed fracture of right distal radius   Frequent falls  Intractable pain:  - Continue medications, multimodal to reduce narcotics as much as possible. Schedule tylenol, urge use of oxyIR over IV medications.   Right radius fracture s/p ORIF 3/19 by Dr. Claudia Desanctis:  - Appreciate Dr. Claudia Desanctis evaluation today. No postoperative complication is currently evident.   Frequent falls, muscular deconditioning: Now weakness/debility made worse by recent fracture and surgery.  - PT evaluation completed. SNF recommended. TOC consulted.   UTI: - Continue ciprofloxacin as pt has allergy to cephalosporins, and is resistant to  PCN's.   Confusion: Recent but not acute issue. On chronic depression, anxiety.  - Delirium precautions - Minimize IV sedating medications. Continue home medications including wellbutrin, lexapro, prn clonazepam. Counseled about danger of polypharmacy, particularly opioids + benzodiazepines.  - Recommend outpatient neuropsychiatric follow up.  Hyperlipidemia:  - Continue statin  T2DM:  - SSI, check A1c.   Asthma: No wheezing.  - Continue bronchodilators, singulair  Hypothyroidism:  - Continue synthroid.  GERD:  - Continue acid suppression.   Patrecia Pour, MD Pager on [facility] 08/14/2019, 2:47 PM

## 2019-08-14 NOTE — TOC Initial Note (Signed)
Transition of Care [facility]) - Initial/Assessment Note    Patient Details  Name: KAIYANA BEDORE MRN: [id number] Date of Birth: 18-Apr-1946  [facility] CM/SW Contact:    Trish Mage, LCSW Phone Number: 08/14/2019, 4:01 PM  Clinical Narrative:  Met with patient in follow up to PT recommendation of rehab.  She deferred to [name] from [place], who arrived earlier this week after patient fell, and is planning on staying for near future in order to help her father at home, who is also medically compromised.  We called Charlane Ferretti, who stated the decision has already been made for patient to go to rehab.  Ms Christiana Fuchs absorbed the blow and rebounded, stating she would do whatever her daughter recommended.  Explained process.  CSW will initiate bed search, authorization request. TOC will continue to follow during the course of hospitalization.              Expected Discharge Plan: Skilled Nursing Facility Barriers to Discharge: SNF Pending bed offer   Patient Goals and CMS Choice Patient states their goals for this hospitalization and ongoing recovery are:: "I guess I need to go get help." CMS Medicare.gov Compare Post Acute Care list provided to:: Patient Choice offered to / list presented to : Patient, Adult Children  Expected Discharge Plan and Services Expected Discharge Plan: [place]   Discharge Planning Services: CM Consult Post Acute Care Choice: [place] Living arrangements for the past 2 months: Single Family Home                                      Prior Living Arrangements/Services Living arrangements for the past 2 months: Single Family Home Lives with:: Spouse Patient language and need for interpreter reviewed:: Yes Do you feel safe going back to the place where you live?: Yes      Need for Family Participation in Patient Care: Yes (Comment) Care giver support system in place?: Yes (comment)   Criminal  Activity/Legal Involvement Pertinent to Current Situation/Hospitalization: No - Comment as needed  Activities of Daily Living Home Assistive Devices/Equipment: Cane (specify quad or straight), Grab bars around toilet, Built-in shower seat ADL Screening (condition at time of admission) Patient's cognitive ability adequate to safely complete daily activities?: Yes Is the patient deaf or have difficulty hearing?: No Does the patient have difficulty seeing, even when wearing glasses/contacts?: No Does the patient have difficulty concentrating, remembering, or making decisions?: No Patient able to express need for assistance with ADLs?: Yes Does the patient have difficulty dressing or bathing?: Yes(rt. arm injury) Independently performs ADLs?: No Communication: Independent Dressing (OT): Needs assistance Is this a change from baseline?: Pre-admission baseline Grooming: Needs assistance Is this a change from baseline?: Pre-admission baseline Feeding: Independent Bathing: Needs assistance Is this a change from baseline?: Pre-admission baseline Toileting: Needs assistance Is this a change from baseline?: Pre-admission baseline In/Out Bed: Needs assistance Is this a change from baseline?: Pre-admission baseline Walks in Home: Independent Does the patient have difficulty walking or climbing stairs?: Yes Weakness of Legs: Both Weakness of Arms/Hands: Right  Permission Sought/Granted Permission sought to share information with : Family Supports Permission granted to share information with : Yes, Verbal Permission Granted  Share Information with NAME: Georgeann Oppenheim     Permission granted to share info w Relationship: daughter  Permission granted to share info w Contact Information: [phone]  Emotional  Assessment Appearance:: Appears stated age Attitude/Demeanor/Rapport: Engaged Affect (typically observed): Appropriate Orientation: : Oriented to Self, Oriented to Place, Oriented to  Situation Alcohol / Substance Use: Not Applicable Psych Involvement: No (comment)  Admission diagnosis:  Confusion [R41.0] Post-op pain [G89.18] Acute cystitis without hematuria [N30.00] Intractable pain [R52] Patient Active Problem List   Diagnosis Date Noted  . Intractable pain 08/14/2019  . Acute lower UTI 08/14/2019  . Anxiety 08/14/2019  . Diabetes mellitus type II, non insulin dependent ([place]) 08/14/2019  . Hypothyroidism   . Closed fracture of right distal radius   . Frequent falls   . Acute cystitis without hematuria   . Chronic rhinitis 12/13/2013  . Morbid obesity ([place]) 12/12/2013  . Asthma, chronic 04/02/2013  . 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 [place] [address] Phone: [phone] Fax: [phone]  [id number] [id number]: [phone]: [phone]    Social Determinants of Health (SDOH) Interventions    Readmission Risk Interventions No flowsheet data found.

## 2019-08-14 NOTE — Progress Notes (Signed)
Saw a patient in her hospital room. She's postop day one from a distal radius open reduction internal fixation. She was admitted overnight for pain control. She also says she became scared regarding her social situation and caring for her husband who needs quite a bit of assistance. She's not required much pain medication since admission according to her.  On exam she looks great. Her wrist is splinted she is able to feel all fing ers with normal sensation. She's able to wiggle all fingers and a thumbIn flexion and extension. There's no swelling or bruising in the fingers. She has minimal tenderness proximately around the elbow.  From my standpoint she looks pretty good. I don't think any need further work up is required from the standpoint of her upper extremity. It may be helpful to get physical therapy and case management involved to help with the social  challenges. Please call me with any questions.

## 2019-08-15 DIAGNOSIS — N390 Urinary tract infection, site not specified: Secondary | ICD-10-CM | POA: Diagnosis not present

## 2019-08-15 DIAGNOSIS — J45901 Unspecified asthma with (acute) exacerbation: Secondary | ICD-10-CM | POA: Diagnosis not present

## 2019-08-15 DIAGNOSIS — F419 Anxiety disorder, unspecified: Secondary | ICD-10-CM | POA: Diagnosis not present

## 2019-08-15 DIAGNOSIS — E119 Type 2 diabetes mellitus without complications: Secondary | ICD-10-CM

## 2019-08-15 DIAGNOSIS — R52 Pain, unspecified: Secondary | ICD-10-CM | POA: Diagnosis not present

## 2019-08-15 LAB — URINE CULTURE: Culture: 20000 — AB

## 2019-08-15 LAB — CBC
HCT: 42.7 % (ref 36.0–46.0)
Hemoglobin: 13 g/dL (ref 12.0–15.0)
MCH: 26.9 pg (ref 26.0–34.0)
MCHC: 30.4 g/dL (ref 30.0–36.0)
MCV: 88.4 fL (ref 80.0–100.0)
Platelets: 318 10*3/uL (ref 150–400)
RBC: 4.83 MIL/uL (ref 3.87–5.11)
RDW: 16.2 % — ABNORMAL HIGH (ref 11.5–15.5)
WBC: 15.4 10*3/uL — ABNORMAL HIGH (ref 4.0–10.5)
nRBC: 0 % (ref 0.0–0.2)

## 2019-08-15 LAB — CBC WITH DIFFERENTIAL/PLATELET
Abs Immature Granulocytes: 0.08 10*3/uL — ABNORMAL HIGH (ref 0.00–0.07)
Basophils Absolute: 0 10*3/uL (ref 0.0–0.1)
Basophils Relative: 0 %
Eosinophils Absolute: 0.2 10*3/uL (ref 0.0–0.5)
Eosinophils Relative: 2 %
HCT: 40.6 % (ref 36.0–46.0)
Hemoglobin: 12.6 g/dL (ref 12.0–15.0)
Immature Granulocytes: 1 %
Lymphocytes Relative: 8 %
Lymphs Abs: 1 10*3/uL (ref 0.7–4.0)
MCH: 27.9 pg (ref 26.0–34.0)
MCHC: 31 g/dL (ref 30.0–36.0)
MCV: 90 fL (ref 80.0–100.0)
Monocytes Absolute: 0.9 10*3/uL (ref 0.1–1.0)
Monocytes Relative: 7 %
Neutro Abs: 9.8 10*3/uL — ABNORMAL HIGH (ref 1.7–7.7)
Neutrophils Relative %: 82 %
Platelets: 28 10*3/uL — CL (ref 150–400)
RBC: 4.51 MIL/uL (ref 3.87–5.11)
RDW: 16.4 % — ABNORMAL HIGH (ref 11.5–15.5)
WBC: 11.9 10*3/uL — ABNORMAL HIGH (ref 4.0–10.5)
nRBC: 0 % (ref 0.0–0.2)

## 2019-08-15 LAB — BASIC METABOLIC PANEL
Anion gap: 11 (ref 5–15)
BUN: 7 mg/dL — ABNORMAL LOW (ref 8–23)
CO2: 23 mmol/L (ref 22–32)
Calcium: 8.3 mg/dL — ABNORMAL LOW (ref 8.9–10.3)
Chloride: 104 mmol/L (ref 98–111)
Creatinine, Ser: 0.61 mg/dL (ref 0.44–1.00)
GFR calc Af Amer: >60 mL/min (ref >60)
GFR calc non Af Amer: >60 mL/min (ref >60)
Glucose, Bld: 113 mg/dL — ABNORMAL HIGH (ref 70–99)
Potassium: 4.2 mmol/L (ref 3.5–5.1)
Sodium: 138 mmol/L (ref 135–145)

## 2019-08-15 LAB — GLUCOSE, CAPILLARY
Glucose-Capillary: 93 mg/dL (ref 70–99)
Glucose-Capillary: 131 mg/dL — ABNORMAL HIGH (ref 70–99)

## 2019-08-15 LAB — HEMOGLOBIN A1C
Hgb A1c MFr Bld: 6 % — ABNORMAL HIGH (ref 4.8–5.6)
Mean Plasma Glucose: 125.5 mg/dL

## 2019-08-15 LAB — IMMATURE PLATELET FRACTION: Immature Platelet Fraction: 2 % (ref 1.2–8.6)

## 2019-08-15 NOTE — Progress Notes (Signed)
Patient confused this AM, stating there are ants crawling on her bedside rail and she became teary eyed because she thought she was home and her husband didn't come kiss her this morning. Reoriented easily.

## 2019-08-15 NOTE — Progress Notes (Signed)
PROGRESS NOTE  Erika [name]  [id number] DOB: August 29, 1945 DOA: 08/13/2019 PCP: Harlan Stains, MD   Brief Narrative: Erika [name] is a 74 y.o. female with a history of T2DM, OSA, depression, anxiety, asthma, hypothyroidism, recent cognitive decline, and frequent falls at home with recent fall resulting in right distal radius fracture s/p ORIF on 3/19 who presented to the ED later that night due to uncontrolled pain. There is evidence of UTI with recent culture growing E. coli. She was admitted for intractable pain and will require rehabilitation at SNF prior to returning home  Assessment & Plan: Principal Problem:   Intractable pain Active Problems:   Asthma, chronic   Acute lower UTI   Anxiety   Diabetes mellitus type II, non insulin dependent (HCC)   Hypothyroidism   Closed fracture of right distal radius   Frequent falls  Intractable pain:  - Continue po only medications, multimodal to reduce narcotics as much as possible.  - Schedule tylenol.  Right radius fracture s/p ORIF 3/19 by Dr. Claudia Desanctis:  - Appreciate Dr. Claudia Desanctis evaluation. No postoperative complication is currently evident.   Frequent falls, muscular deconditioning: Now weakness/debility made worse by recent fracture and surgery.  - PT evaluation completed. SNF recommended. TOC consulted.   UTI: - Continue ciprofloxacin as pt has allergy to cephalosporins, and is resistant to PCN's.   Confusion: Recent but not acute issue. On chronic depression, anxiety.  - Delirium precautions - Minimize IV sedating medications.  - Continue home medications including wellbutrin, lexapro, prn clonazepam. - Counseled about danger of polypharmacy, particularly opioids + benzodiazepines.  - Recommend outpatient neuropsychiatric follow up.  Thrombocytopenia: Result confirmed to be spurious in recollection in citrate tube.  Hyperlipidemia:  - Continue statin  T2DM: HbA1c 6%. - Can stop SSI.  Asthma: No wheezing.  -  Continue bronchodilators, singulair  Hypothyroidism:  - Continue synthroid.  GERD:  - Continue acid suppression.   Obesity: Estimated body mass index is 38.06 kg/m as calculated from the following:   Height as of this encounter: 4\' 10"  (1.473 m).   Weight as of this encounter: 82.6 kg.  DVT prophylaxis: SCDs Code Status: Full Family Communication: Called daughter by phone, will continue reaching out.  Disposition Plan: Patient from home, but do to severe weakness and frequent falls with very limited support at home, she requires rehabilitation at rehab facility. Medically stable for discharge pending bed availability.   Consultants:   Plastic surgery  Procedures:   None  Antimicrobials:  Ciprofloxacin   Subjective: Confused a bit this morning but remembers me, denies any pain or shortness of breath.   Objective: Vitals:   08/14/19 1606 08/14/19 2003 08/15/19 0531 08/15/19 1334  BP: (!) 132/54 124/72 133/73 (!) 122/55  Pulse: 81 81 79 77  Resp: 16 16 16 16   Temp: 98 F (36.7 C) 97.8 F (36.6 C) 98 F (36.7 C) 98.1 F (36.7 C)  TempSrc: Oral Oral Oral Oral  SpO2: 92% 90% 97% 97%  Weight:      Height:        Intake/Output Summary (Last 24 hours) at 08/15/2019 1413 Last data filed at 08/15/2019 1202 Gross per 24 hour  Intake 203 ml  Output 1550 ml  Net -1347 ml   Filed Weights   08/13/19 2336 08/14/19 0752  Weight: 79.8 kg 82.6 kg    Gen: 74 y.o. female in no distress Pulm: Non-labored breathing. Clear to auscultation bilaterally.  CV: Regular rate and rhythm. No murmur, rub, or gallop.  No JVD, no significant pedal edema. GI: Abdomen soft, non-tender, non-distended, with normoactive bowel sounds. No organomegaly or masses felt. Ext: Right hand warm, brisk cap refill, sensation and movement intact. Skin: No rashes, lesions or ulcers Neuro: Alert and oriented. No focal neurological deficits. Psych: Judgement and insight appear normal. Mood & affect  appropriate.   Data Reviewed: I have personally reviewed following labs and imaging studies  CBC: Recent Labs  Lab 08/09/19 2027 08/14/19 0230 08/15/19 0527 08/15/19 1026  WBC 14.0* 14.3* 11.9* 15.4*  NEUTROABS  --  12.1* 9.8*  --   HGB 12.4 11.8* 12.6 13.0  HCT 40.3 38.2 40.6 42.7  MCV 89.2 87.6 90.0 88.4  PLT PLATELET CLUMPS NOTED ON SMEAR, UNABLE TO ESTIMATE PLATELET CLUMPS NOTED ON SMEAR, UNABLE TO ESTIMATE 28* 0000000   Basic Metabolic Panel: Recent Labs  Lab 08/09/19 2027 08/10/19 1320 08/14/19 0035 08/15/19 0810  NA 139 138 138 138  K 3.8 4.3 4.0 4.2  CL 104 101 103 104  CO2 28 27 24 23   GLUCOSE 177* 97 124* 113*  BUN 9 7* 9 7*  CREATININE 0.81 0.76 0.71 0.61  CALCIUM 8.0* 8.5* 9.2 8.3*   GFR: Estimated Creatinine Clearance: 56.1 mL/min (by C-G formula based on SCr of 0.61 mg/dL). Liver Function Tests: Recent Labs  Lab 08/14/19 0035  AST 21  ALT 23  ALKPHOS 90  BILITOT 0.8  PROT 7.0  ALBUMIN 3.6   No results for input(s): LIPASE, AMYLASE in the last 168 hours. No results for input(s): AMMONIA in the last 168 hours. Coagulation Profile: No results for input(s): INR, PROTIME in the last 168 hours. Cardiac Enzymes: No results for input(s): CKTOTAL, CKMB, CKMBINDEX, TROPONINI in the last 168 hours. BNP (last 3 results) No results for input(s): PROBNP in the last 8760 hours. HbA1C: Recent Labs    08/15/19 0527  HGBA1C 6.0*   CBG: Recent Labs  Lab 08/14/19 1202 08/14/19 1658 08/14/19 2100 08/15/19 0741 08/15/19 1143  GLUCAP 125* 94 96 93 131*   Lipid Profile: No results for input(s): CHOL, HDL, LDLCALC, TRIG, CHOLHDL, LDLDIRECT in the last 72 hours. Thyroid Function Tests: No results for input(s): TSH, T4TOTAL, FREET4, T3FREE, THYROIDAB in the last 72 hours. Anemia Panel: No results for input(s): VITAMINB12, FOLATE, FERRITIN, TIBC, IRON, RETICCTPCT in the last 72 hours. Urine analysis:    Component Value Date/Time   COLORURINE YELLOW  08/14/2019 0402   APPEARANCEUR CLEAR 08/14/2019 0402   LABSPEC 1.009 08/14/2019 0402   LABSPEC 1.025 06/27/2009 0850   PHURINE 8.0 08/14/2019 0402   GLUCOSEU NEGATIVE 08/14/2019 0402   HGBUR NEGATIVE 08/14/2019 0402   BILIRUBINUR NEGATIVE 08/14/2019 0402   BILIRUBINUR Negative 06/27/2009 0850   KETONESUR 20 (A) 08/14/2019 0402   PROTEINUR NEGATIVE 08/14/2019 0402   UROBILINOGEN 1.0 07/09/2009 1822   NITRITE NEGATIVE 08/14/2019 0402   LEUKOCYTESUR TRACE (A) 08/14/2019 0402   LEUKOCYTESUR Trace 06/27/2009 0850   Recent Results (from the past 240 hour(s))  Urine culture     Status: Abnormal   Collection Time: 08/09/19 10:36 PM   Specimen: Urine, Catheterized  Result Value Ref Range Status   Specimen Description   Final    URINE, CATHETERIZED Performed at [facility], [place] [address], [place], [place] [address]    Special Requests   Final    NONE Performed at [facility], [place] [address], [place], [place] [address]    Culture >=100,000 COLONIES/mL ESCHERICHIA COLI (A)  Final   Report  Status 08/11/2019 FINAL  Final   Organism ID, Bacteria ESCHERICHIA COLI (A)  Final      Susceptibility   Escherichia coli - MIC*    AMPICILLIN >=32 RESISTANT Resistant     CEFAZOLIN <=4 SENSITIVE Sensitive     CEFTRIAXONE <=0.25 SENSITIVE Sensitive     CIPROFLOXACIN <=0.25 SENSITIVE Sensitive     GENTAMICIN 2 SENSITIVE Sensitive     IMIPENEM <=0.25 SENSITIVE Sensitive     NITROFURANTOIN <=16 SENSITIVE Sensitive     TRIMETH/SULFA <=20 SENSITIVE Sensitive     AMPICILLIN/SULBACTAM 16 INTERMEDIATE Intermediate     PIP/TAZO <=4 SENSITIVE Sensitive     * >=100,000 COLONIES/mL ESCHERICHIA COLI  SARS CORONAVIRUS 2 (TAT 6-24 HRS) Nasopharyngeal Nasopharyngeal Swab     Status: None   Collection Time: 08/10/19  2:24 PM   Specimen: Nasopharyngeal Swab  Result Value Ref Range Status   SARS Coronavirus 2 NEGATIVE NEGATIVE Final    Comment: (NOTE) SARS-CoV-2 target  nucleic acids are NOT DETECTED. The SARS-CoV-2 RNA is generally detectable in upper and lower respiratory specimens during the acute phase of infection. Negative results do not preclude SARS-CoV-2 infection, do not rule out co-infections with other pathogens, and should not be used as the sole basis for treatment or other patient management decisions. Negative results must be combined with clinical observations, patient history, and epidemiological information. The expected result is Negative. Fact Sheet for Patients: SugarRoll.be Fact Sheet for Healthcare Providers: https://www.woods-mathews.com/ This test is not yet approved or cleared by the [place] FDA and  has been authorized for detection and/or diagnosis of SARS-CoV-2 by FDA under an Emergency Use Authorization (EUA). This EUA will remain  in effect (meaning this test can be used) for the duration of the COVID-19 declaration under Section 56 4(b)(1) of the Act, 21 U.S.C. section 360bbb-3(b)(1), unless the authorization is terminated or revoked sooner. Performed at [facility], [place] [address] [place], [place] [address]   Urine culture     Status: Abnormal   Collection Time: 08/14/19  4:02 AM   Specimen: Urine, Random  Result Value Ref Range Status   Specimen Description URINE, RANDOM  Final   Special Requests   Final    NONE Performed at [facility] [address] [place], [place] [address]    Culture (A)  Final    20,000 COLONIES/mL MULTIPLE SPECIES PRESENT, SUGGEST RECOLLECTION   Report Status 08/15/2019 FINAL  Final  SARS CORONAVIRUS 2 (TAT 6-24 HRS) Nasopharyngeal Nasopharyngeal Swab     Status: None   Collection Time: 08/14/19  4:53 AM   Specimen: Nasopharyngeal Swab  Result Value Ref Range Status   SARS Coronavirus 2 NEGATIVE NEGATIVE Final    Comment: (NOTE) SARS-CoV-2 target nucleic acids are NOT DETECTED. The SARS-CoV-2 RNA is  generally detectable in upper and lower respiratory specimens during the acute phase of infection. Negative results do not preclude SARS-CoV-2 infection, do not rule out co-infections with other pathogens, and should not be used as the sole basis for treatment or other patient management decisions. Negative results must be combined with clinical observations, patient history, and epidemiological information. The expected result is Negative. Fact Sheet for Patients: SugarRoll.be Fact Sheet for Healthcare Providers: https://www.woods-mathews.com/ This test is not yet approved or cleared by the [place] FDA and  has been authorized for detection and/or diagnosis of SARS-CoV-2 by FDA under an Emergency Use Authorization (EUA). This EUA will remain  in effect (meaning this test can be used) for the  duration of the COVID-19 declaration under Section 56 4(b)(1) of the Act, 21 U.S.C. section 360bbb-3(b)(1), unless the authorization is terminated or revoked sooner. Performed at [facility], [place] [address], [place], [place] [address]       Radiology Studies: No results found.  Scheduled Meds: . acetaminophen  650 mg Oral Q6H  . atorvastatin  20 mg Oral Daily  . buPROPion  150 mg Oral q morning - 10a  . escitalopram  10 mg Oral Daily  . famotidine  20 mg Oral QHS  . fluticasone furoate-vilanterol  1 puff Inhalation Daily  . insulin aspart  0-9 Units Subcutaneous TID WC  . levothyroxine  100 mcg Oral QAC breakfast  . montelukast  10 mg Oral QHS  . oxybutynin  10 mg Oral Daily  . pantoprazole  40 mg Oral Daily  . sodium chloride flush  3 mL Intravenous Q12H   Continuous Infusions: . sodium chloride 250 mL (08/14/19 2213)  . ciprofloxacin 400 mg (08/15/19 1042)     LOS: 0 days   Time spent: 25 minutes.  Patrecia Pour, MD [facility] [email or web address] 08/15/2019, 2:13 PM

## 2019-08-16 ENCOUNTER — Encounter: Payer: Self-pay | Admitting: *Deleted

## 2019-08-16 DIAGNOSIS — F419 Anxiety disorder, unspecified: Secondary | ICD-10-CM | POA: Diagnosis not present

## 2019-08-16 DIAGNOSIS — J45901 Unspecified asthma with (acute) exacerbation: Secondary | ICD-10-CM | POA: Diagnosis not present

## 2019-08-16 DIAGNOSIS — R52 Pain, unspecified: Secondary | ICD-10-CM | POA: Diagnosis not present

## 2019-08-16 DIAGNOSIS — N390 Urinary tract infection, site not specified: Secondary | ICD-10-CM | POA: Diagnosis not present

## 2019-08-16 MED ORDER — HYDROCODONE-ACETAMINOPHEN 5-325 MG PO TABS: 1.0000 | ORAL_TABLET | Freq: Four times a day (QID) | ORAL | 0 refills | Status: AC | PRN

## 2019-08-16 MED ORDER — CLONAZEPAM 0.5 MG PO TABS: 0.5000 mg | ORAL_TABLET | Freq: Two times a day (BID) | ORAL | 0 refills | Status: DC | PRN

## 2019-08-16 NOTE — Progress Notes (Signed)
Inpatient Diabetes Program Recommendations  AACE/ADA: New Consensus Statement on Inpatient Glycemic Control (2015)  Target Ranges:  Prepandial:   less than 140 mg/dL      Peak postprandial:   less than 180 mg/dL (1-2 hours)      Critically ill patients:  140 - 180 mg/dL   Lab Results  Component Value Date   GLUCAP 131 (H) 08/15/2019   HGBA1C 6.0 (H) 08/15/2019    Review of Glycemic Control  Diabetes history: DM2 Outpatient Diabetes medications: metformin 500 mg bid Current orders for Inpatient glycemic control: None  HgbA1C - 6.0% - good control at home.  Inpatient Diabetes Program Recommendations:     Had questions regarding drinking pomegranate juice at night. States she has lost weight since she started drinking it. We discussed how juices affect blood sugar control. Pt states she used to check blood sugars, but hasn't in awhile. Recommended she check blood sugars a couple times/week and at various times such as fasting, bedtime and 2 hours after meals. She will be able to see what foods run her blood sugars up. Being d/ced to Rehab.  Thank you. Lorenda Peck, RD, LDN, CDE Inpatient Diabetes Coordinator [phone]

## 2019-08-16 NOTE — Discharge Summary (Signed)
Physician Discharge Summary  Erika [name] [id number] DOB: 25-Oct-1945 DOA: 08/13/2019  PCP: Harlan Stains, MD  Admit date: 08/13/2019 Discharge date: 08/16/2019  Admitted From: Home Disposition: SNF   Recommendations for Outpatient Follow-up:  1. Follow up with PCP in 1-2 weeks 2. Follow up with Dr. Claudia Desanctis as directed post operatively.  Home Health: N/A Equipment/Devices: Per SNF Discharge Condition: Stable CODE STATUS: Full Diet recommendation: Carb-modified  Brief/Interim Summary: Erika [name] a74 y.o.femalewith a history of T2DM, OSA, depression, anxiety, asthma, hypothyroidism, recent cognitive decline, and frequent falls at home with recent fall resulting in right distal radius fracture s/p ORIF on 3/19 who presented to the ED later that night due to uncontrolled pain. There is evidence of UTI with recent culture growing E. coli. She was admitted for intractable pain and will require rehabilitation at SNF prior to returning home.  Discharge Diagnoses:  Principal Problem:   Intractable pain Active Problems:   Asthma, chronic   Acute lower UTI   Anxiety   Diabetes mellitus type II, non insulin dependent (HCC)   Hypothyroidism   Closed fracture of right distal radius   Frequent falls  Intractable pain: Has resolved. - Continue po only medications, multimodal to reduce narcotics as much as possible.  - Schedule tylenol.  Right radius fracture s/p ORIF 3/19 by Dr. Claudia Desanctis:  - Appreciate Dr. Claudia Desanctis evaluation. No postoperative complication is currently evident. Continue routine f/u.  Frequent falls, muscular deconditioning: Now weakness/debility made worse by recent fracture and surgery.  - PT evaluation completed. SNF recommended. TOC consulted.  UTI, E. coli: No sepsis. Completed 4 days of antibiotics (levaquin and clindamycin 3/19, then ciprofloxacin 3/20 - 3/22).   Confusion: Recent but not acute issue. On chronic depression, anxiety.  - Delirium  precautions - Continue home medications including wellbutrin, lexapro, prn clonazepam.   - Recommend outpatient neuropsychiatric follow up.  Thrombocytopenia: Result confirmed to be spurious in recollection in citrate tube.  Hyperlipidemia:  - Continue statin  T2DM: HbA1c 6%.  Asthma: No wheezing.  - Continue bronchodilators, singulair  Hypothyroidism:  - Continue synthroid.  GERD:  - Continue acid suppression.  Obesity: Estimated body mass index is 38.06 kg/m   Discharge Instructions  Allergies as of 08/16/2019      Reactions   Cephalexin    Sudafed [pseudoephedrine Hcl] Itching, Swelling   Pseudoephedrine Hcl Rash      Medication List    STOP taking these medications   nystatin 100000 UNIT/ML suspension Commonly known as: MYCOSTATIN   oxyCODONE-acetaminophen 5-325 MG tablet Commonly known as: PERCOCET/ROXICET   sulfamethoxazole-trimethoprim 800-160 MG tablet Commonly known as: BACTRIM DS     TAKE these medications   atorvastatin 20 MG tablet Commonly known as: LIPITOR Take 20 mg by mouth daily.   Belsomra 10 MG Tabs Generic drug: Suvorexant Take 10 mg by mouth at bedtime as needed (sleep).   buPROPion 150 MG 24 hr tablet Commonly known as: WELLBUTRIN XL Take 150 mg by mouth every morning.   clonazePAM 0.5 MG tablet Commonly known as: KLONOPIN Take 1 tablet (0.5 mg total) by mouth 2 (two) times daily as needed for anxiety.   escitalopram 20 MG tablet Commonly known as: LEXAPRO Take 10 mg by mouth daily.   esomeprazole 40 MG capsule Commonly known as: NEXIUM Take 30-60 min before first meal of the day What changed:   how much to take  how to take this  when to take this   famotidine 20 MG tablet Commonly known as:  Pepcid One at bedtime What changed:   how much to take  how to take this  when to take this   fluticasone furoate-vilanterol 200-25 MCG/INH Aepb Commonly known as: Breo Ellipta Inhale 1 puff into the lungs  daily.   HYDROcodone-acetaminophen 5-325 MG tablet Commonly known as: Norco Take 1 tablet by mouth every 6 (six) hours as needed for moderate pain.   metFORMIN 500 MG 24 hr tablet Commonly known as: GLUCOPHAGE-XR Take 500 mg by mouth in the morning and at bedtime.   montelukast 10 MG tablet Commonly known as: SINGULAIR Take 10 mg by mouth at bedtime.   nystatin cream Commonly known as: MYCOSTATIN Apply 1 application topically 2 (two) times daily as needed for itching.   oxybutynin 10 MG 24 hr tablet Commonly known as: DITROPAN-XL Take 10 mg by mouth daily.   SUMAtriptan 50 MG tablet Commonly known as: IMITREX Take 50 mg by mouth every 2 (two) hours as needed for migraine. May repeat in 2 hours if headache persists or recurs.   Synthroid 100 MCG tablet Generic drug: levothyroxine Take 100 mcg by mouth daily before breakfast.   ZYRTEC ALLERGY PO Take 1 tablet by mouth daily.       Contact information for follow-up providers    Harlan Stains, MD. Schedule an appointment as soon as possible for a visit.   Specialty: Family Medicine Contact information: [address] [place] [address] [phone]        Cindra Presume, MD Follow up.   Specialty: Plastic Surgery Contact information: [address] [place] [address] [phone]            Contact information for after-discharge care    Destination    [facility] Preferred SNF .   Service: Skilled Nursing Contact information: [address] [address] [phone]                 Allergies  Allergen Reactions  . Cephalexin   . Sudafed [Pseudoephedrine Hcl] Itching and Swelling  . Pseudoephedrine Hcl Rash    Consultations:  Dr. Claudia Desanctis  Procedures/Studies: DG Shoulder Right  Result Date: 08/09/2019 CLINICAL DATA:  Fall with upper extremity pain EXAM: RIGHT SHOULDER - 2+ VIEW COMPARISON:  None. FINDINGS: No acute  displaced fracture or malalignment. Moderate AC joint degenerative change. IMPRESSION: No acute osseous abnormality. Electronically Signed   By: Donavan Foil M.D.   On: 08/09/2019 21:34   DG Elbow 2 Views Right  Result Date: 08/08/2019 CLINICAL DATA:  Fall with right upper extremity pain from the hand to the elbow. EXAM: RIGHT ELBOW - 2 VIEW COMPARISON:  None. FINDINGS: Findings suspicious for impacted radial neck fracture. Lateral view is suboptimal due to difficulties with positioning. Possible joint effusion, not well assessed on provided views. No dislocation. Mild osteoarthritis. IMPRESSION: Findings suspicious for impacted radial neck fracture. Lateral view is suboptimal given difficulties with positioning, possible elbow joint effusion. Electronically Signed   By: Keith Rake M.D.   On: 08/08/2019 16:34   DG Elbow Complete Right  Result Date: 08/09/2019 CLINICAL DATA:  Fall with upper extremity pain EXAM: RIGHT ELBOW - COMPLETE 3+ VIEW COMPARISON:  08/08/2019 FINDINGS: Limited evaluation of the radial head and neck due to positioning and casting material. No obvious dislocation. IMPRESSION: Limited evaluation of radial head and neck due to positioning and osseous overlap. Electronically Signed   By: Donavan Foil M.D.   On: 08/09/2019 21:20  DG Forearm Right  Result Date: 08/08/2019 CLINICAL DATA:  Fall with right upper extremity pain from the hand to the elbow. EXAM: RIGHT FOREARM - 2 VIEW COMPARISON:  None. FINDINGS: Distal radius and ulna styloid fractures better assessed on concurrent wrist exam. Radial neck fracture again questioned. No fracture of the radial or ulnar shafts. Soft tissue edema is noted. IMPRESSION: 1. Distal radius and ulna styloid fractures better assessed on concurrent wrist exam. 2. Questionable radial neck fracture, also suspected on concurrent elbow exam. Electronically Signed   By: Keith Rake M.D.   On: 08/08/2019 16:39   DG Wrist 2 Views Right  Result  Date: 08/08/2019 CLINICAL DATA:  Fall with right upper extremity pain from the hand to the elbow. EXAM: RIGHT WRIST - 2 VIEW COMPARISON:  None. FINDINGS: Displaced distal radial metaphyseal fracture with 1/2 shaft width dorsal displacement of distal fracture fragment. Fracture is comminuted and extends to the distal radioulnar and radiocarpal joints. There is a mildly displaced ulna styloid fracture. Carpal bones remain aligned with the distal radius fragment. Carpal bones are not well assessed, however suspected triquetrum fracture overlying the dorsal proximal carpal row on the lateral view. Soft tissue edema about the wrist. IMPRESSION: 1. Displaced comminuted distal radial metaphyseal fracture extending to the radiocarpal and distal radioulnar joints. Distal fracture fragment is displaced 1 shaft width dorsally. 2. Mildly displaced ulna styloid fracture. 3. Possible triquetrum fracture, age indeterminate. Electronically Signed   By: Keith Rake M.D.   On: 08/08/2019 16:38   DG Wrist Complete Right  Result Date: 08/09/2019 CLINICAL DATA:  Status post fall. EXAM: RIGHT WRIST - COMPLETE 3+ VIEW COMPARISON:  None. FINDINGS: It should be noted that the right wrist was imaged in a fiberglass cast with subsequently obscured osseous and soft tissue detail. A comminuted fracture deformity of the distal right radius is seen with extension to involve the radiocarpal articulation. Approximately 1/2 shaft width dorsal displacement of the distal fracture site is seen. An additional nondisplaced fracture of the distal right ulna is noted. There is no evidence of dislocation. Degenerative changes are seen throughout the right wrist. Soft tissues are unremarkable. IMPRESSION: 1. Colles fracture of the distal right radius with an additional nondisplaced fracture of the distal right ulna. 2. Degenerative changes throughout the right wrist. The presence of a subtle fracture deformity cannot be excluded. CT correlation is  recommended. Electronically Signed   By: Virgina Norfolk M.D.   On: 08/09/2019 21:20   DG Wrist Complete Right  Result Date: 08/08/2019 CLINICAL DATA:  74 year old female with right wrist pain. EXAM: RIGHT WRIST - COMPLETE 3+ VIEW COMPARISON:  Earlier radiograph dated 08/08/2019. FINDINGS: No significant interval change in the displacement and angulation of the distal radial fracture. A fracture of the ulnar styloid is also noted similar to prior radiograph. There has been interval placement of a cast. IMPRESSION: Interval placement of a cast. No significant interval change in the appearance of the distal radial or ulnar styloid fracture since the most recent prior radiograph. Electronically Signed   By: Anner Crete M.D.   On: 08/08/2019 19:47   DG Wrist Complete Right  Result Date: 08/08/2019 CLINICAL DATA:  Fracture, post reduction EXAM: RIGHT WRIST - COMPLETE 3+ VIEW COMPARISON:  08/08/2019, 4:09 p.m. FINDINGS: Improved alignment of fractures of the distal right radius and ulna, however there remains significant impaction and angulation of the radius. There remains a possible triquetral avulsion fracture over the dorsal carpus. Diffuse soft tissue edema. IMPRESSION: Improved alignment  of fractures of the distal right radius and ulna, however there remains significant impaction and angulation of the radius. Electronically Signed   By: Eddie Candle M.D.   On: 08/08/2019 18:48   CT Head Wo Contrast  Result Date: 08/09/2019 CLINICAL DATA:  Fall EXAM: CT HEAD WITHOUT CONTRAST TECHNIQUE: Contiguous axial images were obtained from the base of the skull through the vertex without intravenous contrast. COMPARISON:  None. FINDINGS: Brain: No acute territorial infarction, hemorrhage or intracranial mass. Advanced atrophy. Extensive hypodensity in the white matter consistent with chronic small vessel ischemic change. Nonenlarged ventricles Vascular: No hyperdense vessels.  Carotid vascular calcification  Skull: Normal. Negative for fracture or focal lesion. Sinuses/Orbits: No acute finding. Other: None IMPRESSION: 1. No CT evidence for acute intracranial abnormality. 2. Atrophy and chronic small vessel ischemic change of the white matter Electronically Signed   By: Donavan Foil M.D.   On: 08/09/2019 22:16   CT Cervical Spine Wo Contrast  Result Date: 08/09/2019 CLINICAL DATA:  Head trauma EXAM: CT CERVICAL SPINE WITHOUT CONTRAST TECHNIQUE: Multidetector CT imaging of the cervical spine was performed without intravenous contrast. Multiplanar CT image reconstructions were also generated. COMPARISON:  None. FINDINGS: Alignment: Normal. Skull base and vertebrae: No acute fracture. No primary bone lesion or focal pathologic process. Soft tissues and spinal canal: No prevertebral fluid or swelling. No visible canal hematoma. Disc levels: Mild degenerative changes at C5-C6 and C6-C7. Facet degenerative changes at multiple Upper chest: Negative. Other: None IMPRESSION: Degenerative changes.  No acute osseous abnormality. Electronically Signed   By: Donavan Foil M.D.   On: 08/09/2019 22:19   DG Hand 2 View Right  Result Date: 08/08/2019 CLINICAL DATA:  Fall with right upper extremity pain from the hand to the elbow. EXAM: RIGHT HAND - 2 VIEW COMPARISON:  None. FINDINGS: Distal radius and ulna fractures better assessed on concurrent wrist exam. PA view is suboptimal given difficulties with positioning. Thumb appears abnormally positioned on the PA view, however normal on the lateral view, felt to be due to positioning. Osseous overlap limits assessment of the digits. There is no obvious fracture of the hand. IMPRESSION: No obvious fracture of the hand, limitations related to difficulty with positioning. Distal radius and ulna fractures better assessed on concurrent wrist exam. Electronically Signed   By: Keith Rake M.D.   On: 08/08/2019 16:36   Subjective: Pain well controlled, mentating clearly this  morning, but has been confused intermittently. No new complaints.   Discharge Exam: Vitals:   08/16/19 0628 08/16/19 0922  BP: 121/67   Pulse: 79   Resp: 14   Temp: 98 F (36.7 C)   SpO2: 96% 98%   General: Pt is alert, awake, not in acute distress Extremities: Right hand warm, brisk cap refill, no proximal discharge/erythema. Sensation and motor function intact.  Labs: Basic Metabolic Panel: Recent Labs  Lab 08/09/19 2027 08/10/19 1320 08/14/19 0035 08/15/19 0810  NA 139 138 138 138  K 3.8 4.3 4.0 4.2  CL 104 101 103 104  CO2 28 27 24 23   GLUCOSE 177* 97 124* 113*  BUN 9 7* 9 7*  CREATININE 0.81 0.76 0.71 0.61  CALCIUM 8.0* 8.5* 9.2 8.3*   Liver Function Tests: Recent Labs  Lab 08/14/19 0035  AST 21  ALT 23  ALKPHOS 90  BILITOT 0.8  PROT 7.0  ALBUMIN 3.6   CBC: Recent Labs  Lab 08/09/19 2027 08/14/19 0230 08/15/19 0527 08/15/19 1026  WBC 14.0* 14.3* 11.9* 15.4*  NEUTROABS  --  12.1* 9.8*  --   HGB 12.4 11.8* 12.6 13.0  HCT 40.3 38.2 40.6 42.7  MCV 89.2 87.6 90.0 88.4  PLT PLATELET CLUMPS NOTED ON SMEAR, UNABLE TO ESTIMATE PLATELET CLUMPS NOTED ON SMEAR, UNABLE TO ESTIMATE 28* 318   CBG: Recent Labs  Lab 08/14/19 1202 08/14/19 1658 08/14/19 2100 08/15/19 0741 08/15/19 1143  GLUCAP 125* 94 96 93 131*   Hgb A1c Recent Labs    08/15/19 0527  HGBA1C 6.0*   Urinalysis    Component Value Date/Time   COLORURINE YELLOW 08/14/2019 0402   APPEARANCEUR CLEAR 08/14/2019 0402   LABSPEC 1.009 08/14/2019 0402   LABSPEC 1.025 06/27/2009 0850   PHURINE 8.0 08/14/2019 0402   GLUCOSEU NEGATIVE 08/14/2019 0402   HGBUR NEGATIVE 08/14/2019 0402   BILIRUBINUR NEGATIVE 08/14/2019 0402   BILIRUBINUR Negative 06/27/2009 0850   KETONESUR 20 (A) 08/14/2019 0402   PROTEINUR NEGATIVE 08/14/2019 0402   UROBILINOGEN 1.0 07/09/2009 1822   NITRITE NEGATIVE 08/14/2019 0402   LEUKOCYTESUR TRACE (A) 08/14/2019 0402   LEUKOCYTESUR Trace 06/27/2009 0850     Microbiology Recent Results (from the past 240 hour(s))  Urine culture     Status: Abnormal   Collection Time: 08/09/19 10:36 PM   Specimen: Urine, Catheterized  Result Value Ref Range Status   Specimen Description   Final    URINE, CATHETERIZED Performed at [facility] [place] [address], [place], [place] [address]    Special Requests   Final    NONE Performed at [facility], [place] [address], [place], [place] [address]    Culture >=100,000 COLONIES/mL ESCHERICHIA COLI (A)  Final   Report Status 08/11/2019 FINAL  Final   Organism ID, Bacteria ESCHERICHIA COLI (A)  Final      Susceptibility   Escherichia coli - MIC*    AMPICILLIN >=32 RESISTANT Resistant     CEFAZOLIN <=4 SENSITIVE Sensitive     CEFTRIAXONE <=0.25 SENSITIVE Sensitive     CIPROFLOXACIN <=0.25 SENSITIVE Sensitive     GENTAMICIN 2 SENSITIVE Sensitive     IMIPENEM <=0.25 SENSITIVE Sensitive     NITROFURANTOIN <=16 SENSITIVE Sensitive     TRIMETH/SULFA <=20 SENSITIVE Sensitive     AMPICILLIN/SULBACTAM 16 INTERMEDIATE Intermediate     PIP/TAZO <=4 SENSITIVE Sensitive     * >=100,000 COLONIES/mL ESCHERICHIA COLI  SARS CORONAVIRUS 2 (TAT 6-24 HRS) Nasopharyngeal Nasopharyngeal Swab     Status: None   Collection Time: 08/10/19  2:24 PM   Specimen: Nasopharyngeal Swab  Result Value Ref Range Status   SARS Coronavirus 2 NEGATIVE NEGATIVE Final    Comment: (NOTE) SARS-CoV-2 target nucleic acids are NOT DETECTED. The SARS-CoV-2 RNA is generally detectable in upper and lower respiratory specimens during the acute phase of infection. Negative results do not preclude SARS-CoV-2 infection, do not rule out co-infections with other pathogens, and should not be used as the sole basis for treatment or other patient management decisions. Negative results must be combined with clinical observations, patient history, and epidemiological information. The expected result is Negative. Fact  Sheet for Patients: SugarRoll.be Fact Sheet for Healthcare Providers: https://www.woods-mathews.com/ This test is not yet approved or cleared by the [place] FDA and  has been authorized for detection and/or diagnosis of SARS-CoV-2 by FDA under an Emergency Use Authorization (EUA). This EUA will remain  in effect (meaning this test can be used) for the duration of the COVID-19 declaration under Section 56 4(b)(1) of the Act, 21 U.S.C. section 360bbb-3(b)(1), unless the authorization  is terminated or revoked sooner. Performed at [facility], [place] [address] [place], [place] [address]   Urine culture     Status: Abnormal   Collection Time: 08/14/19  4:02 AM   Specimen: Urine, Random  Result Value Ref Range Status   Specimen Description URINE, RANDOM  Final   Special Requests   Final    NONE Performed at [place] [address], [place], [place] [address]    Culture (A)  Final    20,000 COLONIES/mL MULTIPLE SPECIES PRESENT, SUGGEST RECOLLECTION   Report Status 08/15/2019 FINAL  Final  SARS CORONAVIRUS 2 (TAT 6-24 HRS) Nasopharyngeal Nasopharyngeal Swab     Status: None   Collection Time: 08/14/19  4:53 AM   Specimen: Nasopharyngeal Swab  Result Value Ref Range Status   SARS Coronavirus 2 NEGATIVE NEGATIVE Final    Comment: (NOTE) SARS-CoV-2 target nucleic acids are NOT DETECTED. The SARS-CoV-2 RNA is generally detectable in upper and lower respiratory specimens during the acute phase of infection. Negative results do not preclude SARS-CoV-2 infection, do not rule out co-infections with other pathogens, and should not be used as the sole basis for treatment or other patient management decisions. Negative results must be combined with clinical observations, patient history, and epidemiological information. The expected result is Negative. Fact Sheet for  Patients: SugarRoll.be Fact Sheet for Healthcare Providers: https://www.woods-mathews.com/ This test is not yet approved or cleared by the [place] FDA and  has been authorized for detection and/or diagnosis of SARS-CoV-2 by FDA under an Emergency Use Authorization (EUA). This EUA will remain  in effect (meaning this test can be used) for the duration of the COVID-19 declaration under Section 56 4(b)(1) of the Act, 21 U.S.C. section 360bbb-3(b)(1), unless the authorization is terminated or revoked sooner. Performed at [facility], [place] [address], [place], [place] [address]     Time coordinating discharge: Approximately 40 minutes  Patrecia Pour, MD  [facility] 08/16/2019, 12:23 PM

## 2019-08-16 NOTE — Addendum Note (Signed)
Addendum  created 08/16/19 1039 by Tawni Millers, CRNA   Charge Capture section accepted

## 2019-08-16 NOTE — TOC Transition Note (Signed)
Transition of Care [facility]) - CM/SW Discharge Note   Patient Details  Name: SHANIYA HEIDRICK MRN: [id number] Date of Birth: 03-Jan-1946  Transition of Care [facility]) CM/SW Contact:  Lynnell Catalan, RN Phone Number: 08/16/2019, 2:10 PM   Clinical Narrative:    Pt and daughter given bed offers. [facility] was chosen and [facility] liaison contacted to alert of bed need. [facility] to transport pt to facility. Room number will be 117b and RN to call report to [phone]. Insurance Auth number [id number]   Final next level of care: Skilled Nursing Facility Barriers to Discharge: No Barriers Identified   Patient Goals and CMS Choice Patient states their goals for this hospitalization and ongoing recovery are:: "I guess I need to go get help." CMS Medicare.gov Compare Post Acute Care list provided to:: Patient Choice offered to / list presented to : Patient, Adult Children  Discharge Placement              [facility] Patient to be transferred to facility by: [facility] Name of family member notified: Izora Gala (daughter) Patient and family notified of of transfer: 08/16/19  Discharge Plan and Services   Discharge Planning Services: CM Consult Post Acute Care Choice: [facility]                               Social Determinants of Health (SDOH) Interventions     Readmission Risk Interventions No flowsheet data found.

## 2019-08-16 NOTE — Progress Notes (Signed)
According to patients husband, Erika [name] went to [facility] ED per Dr Keane Scrape request where she was admitted for pain control after surgery on Friday 08/13/19. She has become disoriented and he states the plan is to admit her to a [facility] once her pain is under control.

## 2019-08-18 ENCOUNTER — Ambulatory Visit: Payer: Medicare Other | Attending: Internal Medicine

## 2019-08-18 DIAGNOSIS — Z23 Encounter for immunization: Secondary | ICD-10-CM

## 2019-08-18 NOTE — Progress Notes (Signed)
   Covid-19 Vaccination Clinic  Name:  Erika [name]    MRN: [id number] DOB: Feb 19, 1946  08/18/2019  Erika [name] was observed post Covid-19 immunization for 15 minutes without incident. She was provided with Vaccine Information Sheet and instruction to access the V-Safe system.   Erika [name] was instructed to call 911 with any severe reactions post vaccine: Marland Kitchen Difficulty breathing  . Swelling of face and throat  . A fast heartbeat  . A bad rash all over body  . Dizziness and weakness   Immunizations Administered    Name Date Dose VIS Date Route   Pfizer COVID-19 Vaccine 08/18/2019 10:17 AM 0.3 mL 05/07/2019 Intramuscular   Manufacturer: [place]   Lot: [id number]   [place]: [id number]

## 2019-08-23 ENCOUNTER — Encounter: Payer: Medicare Other | Admitting: Plastic Surgery

## 2019-08-26 ENCOUNTER — Encounter: Payer: Medicare Other | Admitting: Plastic Surgery

## 2019-09-01 ENCOUNTER — Encounter: Payer: Self-pay | Admitting: Plastic Surgery

## 2019-09-01 ENCOUNTER — Ambulatory Visit (INDEPENDENT_AMBULATORY_CARE_PROVIDER_SITE_OTHER): Payer: Medicare Other | Admitting: Plastic Surgery

## 2019-09-01 ENCOUNTER — Ambulatory Visit: Payer: Medicare Other | Attending: Plastic Surgery | Admitting: *Deleted

## 2019-09-01 ENCOUNTER — Ambulatory Visit
Admission: RE | Admit: 2019-09-01 | Discharge: 2019-09-01 | Disposition: A | Discharge status: Home / Self Care | Payer: Medicare Other | Source: Ambulatory Visit | Attending: Plastic Surgery | Admitting: Plastic Surgery

## 2019-09-01 ENCOUNTER — Other Ambulatory Visit: Payer: Self-pay

## 2019-09-01 ENCOUNTER — Encounter: Payer: Self-pay | Admitting: *Deleted

## 2019-09-01 VITALS — BP 124/82 | HR 99 | Temp 98.0°F | Ht <= 58 in | Wt 156.0 lb

## 2019-09-01 DIAGNOSIS — M25631 Stiffness of right wrist, not elsewhere classified: Secondary | ICD-10-CM | POA: Diagnosis present

## 2019-09-01 DIAGNOSIS — S52501A Unspecified fracture of the lower end of right radius, initial encounter for closed fracture: Secondary | ICD-10-CM

## 2019-09-01 DIAGNOSIS — M25531 Pain in right wrist: Secondary | ICD-10-CM

## 2019-09-01 DIAGNOSIS — R278 Other lack of coordination: Secondary | ICD-10-CM | POA: Diagnosis present

## 2019-09-01 DIAGNOSIS — M6281 Muscle weakness (generalized): Secondary | ICD-10-CM | POA: Insufficient documentation

## 2019-09-01 NOTE — Therapy (Signed)
[facility] [address] [place], [place], [address] Phone: [phone]   Fax:  [phone]  Occupational Therapy Evaluation  Patient Details  Name: Erika [name] MRN: [id number] Date of Birth: [birth year] Referring Provider (OT): Dr Claudia Desanctis   Encounter Date: 09/01/2019  OT End of Session - 09/01/19 1058    Visit Number  1    Number of Visits  12    Date for OT Re-Evaluation  10/20/19    Authorization Type  [employer]    OT Start Time  0930    OT Stop Time  1035    OT Time Calculation (min)  65 min    Activity Tolerance  Patient tolerated treatment well;No increased pain    Behavior During Therapy  WFL for tasks assessed/performed       Past Medical History:  Diagnosis Date  . Anxiety   . Asthma   . Closed fracture of right distal radius   . Diabetes mellitus without complication ([place])   . GERD (gastroesophageal reflux disease)   . Headache   . hodgkins lymphoma dx'd 07/14/09 & 05/2009   chemo comp; bone marrow transplant  . Hypothyroidism   . OSA (obstructive sleep apnea)    not using CPAP- unable to tolerate    Past Surgical History:  Procedure Laterality Date  . BONE MARROW TRANSPLANT  2011  . CHOLECYSTECTOMY  1966  . ORIF RADIAL FRACTURE Right 08/13/2019   Procedure: OPEN REDUCTION INTERNAL FIXATION (ORIF) RADIAL FRACTURE;  Surgeon: Cindra Presume, MD;  Location: [facility];  Service: Plastics;  Laterality: Right;  Marland Kitchen VESICOVAGINAL FISTULA CLOSURE W/ TAH  1999    There were no vitals filed for this visit.  Subjective Assessment - 09/01/19 0935    Subjective   Pt sustained multiple falls at home on 08/08/2019 and went to ER. She was dx with unspecified radius fracture and presents for protective splinting per Dr Claudia Desanctis. She underwent ORIF on 08/13/2019 to distal radius. She will f/u with Dr Claudia Desanctis later today.    Patient is accompanied by:  Family member   Daughter Erika [name].   Pertinent History  Pt sustained non specified radius fracture right UE on 08/08/2019 and underwent ORIF on 08/13/2019, she is L HD. See PMH in chart for full details: anxiety, asthma, closed fracture of distal radius, DM type 2, GERD, Hodgkind lymphoma, hypothyroidism, sleep apnea, headaches.    Currently in Pain?  No/denies    Pain Score  0-No pain    Pain Location  Arm    Pain Orientation  Right    Pain Descriptors / Indicators  --   Pt reported having pain yesterday but not today.   Pain Type  Acute pain    Pain Onset  1 to 4 weeks ago    Pain Frequency  Intermittent    Aggravating Factors   Movement, being without sling.    Pain Relieving Factors  Sling and resting arm over chest on pillows    Multiple Pain Sites  No        [facility] OT Assessment - 09/01/19 0001      Assessment   Medical Diagnosis  ORIF distal radius right UE    Referring Provider (OT)  Dr Claudia Desanctis    Onset Date/Surgical Date  08/08/19   Fall 08/08/2019; DOS 08/13/2019 ORIF DR Erika [name]   Hand Dominance  Left    Next MD Visit  Dr Claudia Desanctis    Prior Therapy  Staying at [facility]      Precautions   Precautions  Fall      Restrictions   Weight Bearing Restrictions  --   R UE NWB     Balance Screen   Has the patient fallen in the past 6 months  Yes    How many times?  6    Has the patient had a decrease in activity level because of a fear of falling?   Yes    Is the patient reluctant to leave their home because of a fear of falling?   No      Home  Environment   Family/patient expects to be discharged to:  [facility] With  Other (Comment)   Currently in SNF plans to be d/c Sat     Prior Function   Level of Independence  Independent    Vocation  Retired    Leisure  Read, knit, Bible study      ADL   ADL comments  Pt reports overall Mod I for ADL's given increased time      Mobility   Mobility Status  History of falls      Written Expression   Dominant Hand  Left      Vision -  History   Baseline Vision  Wears glasses all the time      Cognition   Overall Cognitive Status  Within Functional Limits for tasks assessed      Observation/Other Assessments   Observations  Pt presetns in sling with forearm in protected position.      Sensation   Light Touch  Appears Intact      Edema   Edema  None noted upon visual observation in clinic today.      OT Treatments/Exercises (OP) - 09/01/19 0001      Exercises   Exercises  Hand;Shoulder;Elbow   Active ROM (see below for details).     Splinting   Splinting  Pt was fitted with a custom fabricated volar wrist aplint placing her right wrist in neutral position with fingers free for active ROM. Pt and her adult daughter were educated in splinting use, care and precautions. Pt was also instructed in active ROM to fingers for fisting, composite fist, hook fist, active elbow extension and active shoulder ROM R UE. Handouts were issued and reviewed in clinic today and pt/daughter verbalied understanding.       WEARING SCHEDULE:  Wear splint at ALL times except for hygiene care (May remove splint for exercises and then immediately place back on ONLY if directed by the therapist). Do finger, elbow and and shoulder exercises as discussed in clinic today.  PURPOSE:  To prevent movement and for protection until injury can heal  CARE OF SPLINT:  Keep splint away from heat sources including: stove, radiator or furnace, or a car in sunlight. The splint can melt and will no longer fit you properly  Keep away from pets and children  Clean the splint with rubbing alcohol.  * During this time, make sure you also clean your hand/arm as instructed by your therapist and/or perform dressing changes as needed. Then dry hand/arm completely before replacing splint. (When cleaning hand/arm, keep it immobilized in same position until splint is replaced)  PRECAUTIONS/POTENTIAL PROBLEMS: *If you notice or experience increased pain,  swelling, numbness, or a lingering reddened area from the splint: Contact your therapist immediately by calling [phone]. You must wear the splint for protection, but  we will get you scheduled for adjustments as quickly as possible.  (If only straps or hooks need to be replaced and NO adjustments to the splint need to be made, just call the office ahead and let them know you are coming in)  If you have any medical concerns or signs of infection, please call your doctor immediately  Flexor Tendon Gliding (Active Full Fist)    Straighten all fingers, then make a fist, bending all joints. Repeat _5-10__ times. Do _4-5__ sessions per day.   Flexor Tendon Gliding (Active Hook Fist)    With fingers and knuckles straight, bend middle and tip joints. Do not bend large knuckles. Repeat __5-10__ times. Do _4-5___ sessions per day.  Copyright  VHI. All rights reserved.     Elbow: Extension    .Straighten you elbow/forearm (you do not need to use your other hand to stretch it).  Repeat __5-10__ times. Do _4-5___ sessions per day. CAUTION: Stretch slowly and gently. Do not force joint.   Stretch your arm up above your shoulder to get movement.      OT Education - 09/01/19 1047    Education Details  Splinting use, care and precautions. HEP for active ROM to right elbow, digits and shoulder within splint until directed by MD to begin wrist active ROM as well.    Person(s) Educated  Patient;Child(ren)   Daughter Erika [name]   Methods  Explanation;Demonstration;Verbal cues;Handout    Comprehension  Verbalized understanding       OT Short Term Goals - 09/01/19 1107      OT SHORT TERM GOAL #1   Title  Pt will be Mod I splint use, care and precautions R wrist    Baseline  Req Min vc's for don/doff    Time  4    Period  Weeks    Status  New    Target Date  09/29/19      OT SHORT TERM GOAL #2   Title  Pt will be Mod I HEP R UE (fingers, elbow, shoulder and wrist).    Baseline   Min vc's required    Time  4    Period  Weeks    Status  New    Target Date  09/29/19        OT Long Term Goals - 09/01/19 1109      OT LONG TERM GOAL #1   Title  Pt will be Mod I upgraded HEP R wrist for AROM    Baseline  depedent/unable    Time  8    Period  Weeks    Status  New    Target Date  10/27/19      OT LONG TERM GOAL #2   Title  Pt will demonstrate active ROM R fingers WFL's for use as non-dominant hand for light ADL's and IADL's (simulating knitting, opening packaging, supinating hand to get change, reaching for lightweight object on tabletop etc).    Baseline  Unable    Time  8    Period  Weeks    Status  New    Target Date  10/27/19      OT LONG TERM GOAL #3   Title  Pt will demonstrate Mod I for R wrist active ROM for forearm supination and wrist flexion/extension during light ADL's and leisure activities.    Baseline  unable    Time  8    Period  Weeks    Status  New    Target Date  10/27/19      OT LONG TERM GOAL #4   Title  Pt will be Mod I scar management right volar wrist.    Baseline  unable    Time  8    Period  Weeks    Status  New    Target Date  10/27/19            Plan - 09/01/19 1100    Clinical Impression Statement  Pt is a 74 y/o left HD female s/p R ORIF following distal radius fracture on 08/13/2019 (Date of injury - 08/08/2019 after falling in her home). Pt presented today for custom volar splinting following her ORIF and should also benefit from pt/fam education for HEP and functional use of R non-dominant hand following recent ORIF. She is currently 2 weeks and 5 days post-op ORIF R wrist.    OT Occupational Profile and History  Problem Focused Assessment - Including review of records relating to presenting problem    Occupational performance deficits (Please refer to evaluation for details):  ADL's;IADL's;Leisure    Body Structure / Function / Physical Skills  ADL;Dexterity;ROM;Scar  mobility;Strength;Flexibility;Coordination;FMC;Pain;UE functional use    Rehab Potential  Good    Clinical Decision Making  Limited treatment options, no task modification necessary    Comorbidities Affecting Occupational Performance:  May have comorbidities impacting occupational performance    Modification or Assistance to Complete Evaluation   No modification of tasks or assist necessary to complete eval    OT Frequency  2x / week    OT Duration  12 weeks    OT Treatment/Interventions  Self-care/ADL training;Fluidtherapy;Splinting;Therapeutic activities;Therapeutic exercise;Scar mobilization;Manual Therapy;Patient/family education;Paraffin;Passive range of motion    Plan  Splint check and adjustments, upgrade HEP for fingers, elbow/shoulder and wrist as indicated by Dr Claudia Desanctis (Pt with f/u visit on 09/01/2019 at 12:30pm).    Recommended Other Services  F/U with Dr Claudia Desanctis for progression of HEP    Consulted and Agree with Plan of Care  Patient;Family member/caregiver    Family Member Consulted  Daughter Erika [name]       Patient will benefit from skilled therapeutic intervention in order to improve the following deficits and impairments:   Body Structure / Function / Physical Skills: ADL, Dexterity, ROM, Scar mobility, Strength, Flexibility, Coordination, FMC, Pain, UE functional use       Visit Diagnosis: Stiffness of right wrist, not elsewhere classified - Plan: Ot plan of care cert/re-cert  Pain in right wrist - Plan: Ot plan of care cert/re-cert  Other lack of coordination - Plan: Ot plan of care cert/re-cert  Muscle weakness (generalized) - Plan: Ot plan of care cert/re-cert    Problem List Patient Active Problem List   Diagnosis Date Noted  . Intractable pain 08/14/2019  . Acute lower UTI 08/14/2019  . Anxiety 08/14/2019  . Diabetes mellitus type II, non insulin dependent ([place]) 08/14/2019  . Hypothyroidism   . Closed fracture of right distal radius   . Frequent falls   . Acute  cystitis without hematuria   . Chronic rhinitis 12/13/2013  . Morbid obesity ([place]) 12/12/2013  . Asthma, chronic 04/02/2013  . 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 [place], [place], [address] Phone: [phone]   Fax:  [phone]  Name: Erika [name] MRN: [id number] Date of Birth: 30-Dec-1945

## 2019-09-01 NOTE — Patient Instructions (Signed)
WEARING SCHEDULE:  Wear splint at ALL times except for hygiene care (May remove splint for exercises and then immediately place back on ONLY if directed by the therapist). Do finger, elbow and and shoulder exercises as discussed in clinic today.  PURPOSE:  To prevent movement and for protection until injury can heal  CARE OF SPLINT:  Keep splint away from heat sources including: stove, radiator or furnace, or a car in sunlight. The splint can melt and will no longer fit you properly  Keep away from pets and children  Clean the splint with rubbing alcohol.  * During this time, make sure you also clean your hand/arm as instructed by your therapist and/or perform dressing changes as needed. Then dry hand/arm completely before replacing splint. (When cleaning hand/arm, keep it immobilized in same position until splint is replaced)  PRECAUTIONS/POTENTIAL PROBLEMS: *If you notice or experience increased pain, swelling, numbness, or a lingering reddened area from the splint: Contact your therapist immediately by calling [phone]. You must wear the splint for protection, but we will get you scheduled for adjustments as quickly as possible.  (If only straps or hooks need to be replaced and NO adjustments to the splint need to be made, just call the office ahead and let them know you are coming in)  If you have any medical concerns or signs of infection, please call your doctor immediately  Flexor Tendon Gliding (Active Full Fist)    Straighten all fingers, then make a fist, bending all joints. Repeat _5-10__ times. Do _4-5__ sessions per day.   Flexor Tendon Gliding (Active Hook Fist)    With fingers and knuckles straight, bend middle and tip joints. Do not bend large knuckles. Repeat __5-10__ times. Do _4-5___ sessions per day.  Copyright  VHI. All rights reserved.     Elbow: Extension    .Straighten you elbow/forearm (you do not need to use your other hand to stretch it).  Repeat ____ times. Do ____ sessions per day. CAUTION: Stretch slowly and gently. Do not force joint.   Stretch your arm up above your shoulder to get movement.

## 2019-09-01 NOTE — Progress Notes (Signed)
Patient is here postop from an open reduction internal fixation of a distal radius fracture.  She has been in a skilled nursing facility to assist with ADLs while she recovers.  She should be living there this weekend she believes.  She has been transition to a volar wrist splint and claims to be doing fairly well.  On exam she has good range of motion in her fingers and thumb.  She has normal sensation throughout.  Her wound has a little bit of scabbing but looks to be healing fine.  Her imaging from today was reviewed.  I suspect some proximal displacement of the lunate fossa fragment.  It is difficult to clearly evaluate this with the views that I have available.  The remainder the hardware and screws look to be in place without any signs of issues in that regard.  I had a discussion with the patient and her daughter about the displacement of the articular surface.  They are not interested in any sort of revision procedures and I am not sure that it would ultimately improve her outcome significantly at this point.  We will plan to continue wrist immobilization and allow finger range of motion in the meantime.  I will see her again in 2 weeks with another x-ray.

## 2019-09-13 ENCOUNTER — Ambulatory Visit: Payer: Medicare Other | Admitting: Occupational Therapy

## 2019-09-16 ENCOUNTER — Other Ambulatory Visit: Payer: Self-pay

## 2019-09-16 ENCOUNTER — Ambulatory Visit
Admission: RE | Admit: 2019-09-16 | Discharge: 2019-09-16 | Disposition: A | Discharge status: Home / Self Care | Payer: Medicare Other | Source: Ambulatory Visit | Attending: Plastic Surgery | Admitting: Plastic Surgery

## 2019-09-16 ENCOUNTER — Ambulatory Visit (INDEPENDENT_AMBULATORY_CARE_PROVIDER_SITE_OTHER): Payer: Medicare Other | Admitting: Plastic Surgery

## 2019-09-16 VITALS — BP 137/85 | HR 85 | Temp 97.1°F

## 2019-09-16 DIAGNOSIS — S52501A Unspecified fracture of the lower end of right radius, initial encounter for closed fracture: Secondary | ICD-10-CM

## 2019-09-16 NOTE — Progress Notes (Signed)
Patient is here postop from a open reduction internal fixation of a heavily comminuted distal radius fracture.  She is about a month out at this point.  At her last visit she was doing well but I noticed some signs of collapse of the lunate fossa fragment.  She has been in her splint and complains of some pain and stiffness but overall she is happy.  On exam she has a little bit of scabbing along her incision but no erythema or drainage.  She has good range of motion in her fingers and normal sensation in her hand.  Repeat x-ray from today compared to 2 weeks ago shows essentially no change.  She has had collapse of the lunate fossa fragment with the potential for one of the ulnar pegs to be in the joint.  It is difficult to say that this is the case based on the views, but it appears likely.  I long discussion with the patient about the options.  None of [place] are that enthusiastic about a revision as her bone was very soft and there is the potential for doing more harm than good try to mobilize the lunate fossa fragment.  We have elected to allow this to heal as is and I will take the plate out months down the road if she is symptomatic from it."  Avoid any strengthening with her for now given the collapse of that fragment.  I will plan to see her back in 3 weeks with another x-ray and if everything looks stable then she can start strengthening at that time.

## 2019-09-21 ENCOUNTER — Telehealth: Payer: Self-pay

## 2019-09-21 ENCOUNTER — Encounter: Payer: Self-pay | Admitting: *Deleted

## 2019-09-21 ENCOUNTER — Other Ambulatory Visit: Payer: Self-pay

## 2019-09-21 ENCOUNTER — Ambulatory Visit: Payer: Medicare Other | Admitting: Occupational Therapy

## 2019-09-21 DIAGNOSIS — M25531 Pain in right wrist: Secondary | ICD-10-CM

## 2019-09-21 DIAGNOSIS — M25631 Stiffness of right wrist, not elsewhere classified: Secondary | ICD-10-CM | POA: Diagnosis not present

## 2019-09-21 NOTE — Telephone Encounter (Signed)
Yes starting that next week should be ok.  Thanks for checking.

## 2019-09-21 NOTE — Telephone Encounter (Signed)
Hi Dr. Claudia Desanctis,   I saw Erika [name] for the first time today since initial evaluation and splinting on 09/01/19 (done by another OT). She is almost 6 weeks post-op but sounds like her healing is slower based on your most recent note. I did give her gentle A/ROM to wrist today to prevent stiffness, and reviewed precautions with her and daughter. Can we progress to gentle P/ROM next week?  You do not see her again until 10/07/19.  Thanks for clarification,  Claiborne Billings

## 2019-09-21 NOTE — Therapy (Signed)
[facility] [address] [place], [place], [address] Phone: [phone]   Fax:  [phone]  Occupational Therapy Treatment  Patient Details  Name: Erika [name] MRN: [id number] Date of Birth: 15-May-1946 Referring Provider (OT): Dr Claudia Desanctis   Encounter Date: 09/21/2019  OT End of Session - 09/21/19 1212    Visit Number  2    Number of Visits  12    Date for OT Re-Evaluation  10/20/19    Authorization Type  [place] Medicare    OT Start Time  0930    OT Stop Time  1015    OT Time Calculation (min)  45 min    Activity Tolerance  Patient tolerated treatment well;No increased pain    Behavior During Therapy  WFL for tasks assessed/performed       Past Medical History:  Diagnosis Date  . Anxiety   . Asthma   . Closed fracture of right distal radius   . Coronary atherosclerosis of native coronary artery   . Diabetes mellitus without complication ([place])   . Dyslipidemia   . GERD (gastroesophageal reflux disease)   . Headache   . hodgkins lymphoma dx'd 07/14/09 & 05/2009   chemo comp; bone marrow transplant  . Hypothyroidism   . Migraine syndrome   . OSA (obstructive sleep apnea)    not using CPAP- unable to tolerate  . Osteoporosis   . Ovarian cancer ([place])   . RLS (restless legs syndrome)   . Vitamin D deficiency     Past Surgical History:  Procedure Laterality Date  . BONE MARROW TRANSPLANT  2011  . CATARACT EXTRACTION    . CHOLECYSTECTOMY  1966  . ORIF RADIAL FRACTURE Right 08/13/2019   Procedure: OPEN REDUCTION INTERNAL FIXATION (ORIF) RADIAL FRACTURE;  Surgeon: Cindra Presume, MD;  Location: [facility];  Service: Plastics;  Laterality: Right;  . vertebroplasties     x 4  . VESICOVAGINAL FISTULA CLOSURE W/ TAH  1999    There were no vitals filed for this visit.  Subjective Assessment - 09/21/19 0936    Subjective   I haven't changed the gauze or taken splint off to clean since  the splint was made.    Patient is accompanied by:  Family member    Pertinent History  Pt sustained non specified radius fracture right UE on 08/08/2019 and underwent ORIF on 08/13/2019, she is L HD. See PMH in chart for full details: anxiety, asthma, closed fracture of distal radius, DM type 2, GERD, Hodgkind lymphoma, hypothyroidism, sleep apnea, headaches.    Currently in Pain?  Yes    Pain Score  5     Pain Location  Wrist    Pain Orientation  Right    Pain Descriptors / Indicators  Aching   stiffness   Pain Frequency  Intermittent    Aggravating Factors   stiffness/achy w/ gentle movement    Pain Relieving Factors  rest        Pt reports she has not changed gauze or cleaned hand since splint was fabricated on 09/01/19. Gauze removed and instructed she no longer needed due to clean closed incision. Had pt wash hands and dry - instructed to gently pat vs rub to dry over incision.  Issued gentle A/ROM HEP today for wrist flex/ext, RD/UD, and forearm sup/pron. - brought daughter back for further education on HEP and current precautions.  Adjusted splint for greater fit/comfort by adding foam to thumb area  and instructed to wear at all times except when performing HEP or hygiene care.                    OT Education - 09/21/19 0954    Education Details  Gentle A/ROM HEP for Rt wrist    Person(s) Educated  Patient;Child(ren)   grown daughter   Methods  Explanation;Demonstration;Verbal cues;Handout    Comprehension  Verbalized understanding;Returned demonstration;Verbal cues required       OT Short Term Goals - 09/21/19 1214      OT SHORT TERM GOAL #1   Title  Pt will be Mod I splint use, care and precautions R wrist    Baseline  Req Min vc's for don/doff    Time  4    Period  Weeks    Status  Achieved    Target Date  09/29/19      OT SHORT TERM GOAL #2   Title  Pt will be Mod I HEP R UE (fingers, elbow, shoulder and wrist).    Baseline  Min vc's required    Time   4    Period  Weeks    Status  On-going    Target Date  09/29/19        OT Long Term Goals - 09/01/19 1109      OT LONG TERM GOAL #1   Title  Pt will be Mod I upgraded HEP R wrist for AROM    Baseline  depedent/unable    Time  8    Period  Weeks    Status  New    Target Date  10/27/19      OT LONG TERM GOAL #2   Title  Pt will demonstrate active ROM R fingers WFL's for use as non-dominant hand for light ADL's and IADL's (simulating knitting, opening packaging, supinating hand to get change, reaching for lightweight object on tabletop etc).    Baseline  Unable    Time  8    Period  Weeks    Status  New    Target Date  10/27/19      OT LONG TERM GOAL #3   Title  Pt will demonstrate Mod I for R wrist active ROM for forearm supination and wrist flexion/extension during light ADL's and leisure activities.    Baseline  unable    Time  8    Period  Weeks    Status  New    Target Date  10/27/19      OT LONG TERM GOAL #4   Title  Pt will be Mod I scar management right volar wrist.    Baseline  unable    Time  8    Period  Weeks    Status  New    Target Date  10/27/19            Plan - 09/21/19 1428    Clinical Impression Statement  Pt returns today after initial evaluation and splinting on 09/01/19. Pt is now 5.5 weeks post-op and has not yet begun A/ROM. Latest MD notes reports no strengthening yet d/t slow healing and soft bones. Pt/daughter shown A/ROM today    Occupational performance deficits (Please refer to evaluation for details):  ADL's;IADL's;Leisure    Body Structure / Function / Physical Skills  ADL;Dexterity;ROM;Scar mobility;Strength;Flexibility;Coordination;FMC;Pain;UE functional use    Rehab Potential  Good    OT Frequency  2x / week    OT Duration  12 weeks  OT Treatment/Interventions  Self-care/ADL training;Fluidtherapy;Splinting;Therapeutic activities;Therapeutic exercise;Scar mobilization;Manual Therapy;Patient/family education;Paraffin;Passive range  of motion    Plan  Ok to progress to gentle P/ROM next week per telephone encounter sent today.    Consulted and Agree with Plan of Care  Patient;Family member/caregiver    Family Member Consulted  [name]       Patient will benefit from skilled therapeutic intervention in order to improve the following deficits and impairments:   Body Structure / Function / Physical Skills: ADL, Dexterity, ROM, Scar mobility, Strength, Flexibility, Coordination, FMC, Pain, UE functional use       Visit Diagnosis: Stiffness of right wrist, not elsewhere classified  Pain in right wrist    Problem List Patient Active Problem List   Diagnosis Date Noted  . Intractable pain 08/14/2019  . Acute lower UTI 08/14/2019  . Anxiety 08/14/2019  . Diabetes mellitus type II, non insulin dependent ([place]) 08/14/2019  . Hypothyroidism   . Closed fracture of right distal radius   . Frequent falls   . Acute cystitis without hematuria   . Chronic rhinitis 12/13/2013  . Morbid obesity ([place]) 12/12/2013  . Asthma, chronic 04/02/2013  . 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

## 2019-09-21 NOTE — Patient Instructions (Addendum)
REMOVE SPLINT TO PERFORM BELOW EXERCISES THEN PLACE SPLINT BACK ON.   Wrist Flexion / Extension    Hold elbows at 90 and close to body, with Rt palm down. Bend Rt wrist gently up and down so fingers point up. Then bend wrist so fingers point down. Repeat sequence _10___ times each way. Do __3-4__ sessions per day.    AROM: Wrist Radial / Ulnar Deviation    Gently bend right wrist from side to side as far as possible. Repeat __10__ times each way.  Do _3-4___ sessions per day.   Combination Movement (Active)    Keep elbow firmly bent at side with wrist straight. Turn palm up and down. Repeat __10__ times. Do _3-4___ sessions per day. Splint can be on for this one

## 2019-09-22 ENCOUNTER — Ambulatory Visit: Payer: Medicare Other | Admitting: Neurology

## 2019-09-22 ENCOUNTER — Ambulatory Visit (INDEPENDENT_AMBULATORY_CARE_PROVIDER_SITE_OTHER): Payer: Medicare Other | Admitting: Diagnostic Neuroimaging

## 2019-09-22 ENCOUNTER — Encounter: Payer: Self-pay | Admitting: *Deleted

## 2019-09-22 VITALS — BP 139/82 | HR 89 | Temp 97.3°F | Ht <= 58 in | Wt 160.5 lb

## 2019-09-22 DIAGNOSIS — R413 Other amnesia: Secondary | ICD-10-CM | POA: Diagnosis not present

## 2019-09-22 NOTE — Patient Instructions (Signed)
MEMORY LOSS / GAIT DIFFICULTY (depression, anxiety, stress; MMSE 28/30; may represent MCI) - safety / supervision issues reviewed - no driving; caution with finances and medications - consider sleep apnea evaluation
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
## 2019-09-23 ENCOUNTER — Telehealth: Payer: Self-pay | Admitting: Plastic Surgery

## 2019-09-23 NOTE — Telephone Encounter (Signed)
FYI-Patient's daughter, Izora Gala, called to advise that patient will not be going to [facility] because she receives Physical Therapy at home as well and her insurance will only cover one so [facility] will take over both the OT and PT. She said someone should be reaching out to get orders for them to take on the OT.

## 2019-09-27 NOTE — Therapy (Signed)
[facility] [address] [place], [place], [address] Phone: [phone]   Fax:  [phone]  Patient Details  Name: KARLISHA MATHENA MRN: [id number] Date of Birth: 1945/11/20 Referring Provider:  Dr. Claudia Desanctis Encounter Date: 09/27/2019  OCCUPATIONAL THERAPY DISCHARGE SUMMARY  Visits from Start of Care: 2  Current functional level related to goals / functional outcomes: Pt has only met splinting goal and initial HEP for A/ROM   Remaining deficits: Same as initial evaluation   Education / Equipment: Splint wear and care, A/ROM HEP  Plan: Patient agrees to discharge.  Patient goals were partially met. Patient is being discharged due to the patient's request.  ????Per daughter, pt will be receiving home health for O.T. services.          Carey Bullocks, OTR/L 09/27/2019, 9:37 AM  [facility] [address] [place], [place], [address] Phone: [phone]   Fax:  [phone]

## 2019-09-28 ENCOUNTER — Encounter: Payer: Medicare Other | Admitting: Occupational Therapy

## 2019-10-07 ENCOUNTER — Other Ambulatory Visit: Payer: Self-pay

## 2019-10-07 ENCOUNTER — Ambulatory Visit
Admission: RE | Admit: 2019-10-07 | Discharge: 2019-10-07 | Disposition: A | Discharge status: Home / Self Care | Payer: Medicare Other | Source: Ambulatory Visit | Attending: Plastic Surgery | Admitting: Plastic Surgery

## 2019-10-07 ENCOUNTER — Encounter: Payer: Self-pay | Admitting: Plastic Surgery

## 2019-10-07 ENCOUNTER — Ambulatory Visit (INDEPENDENT_AMBULATORY_CARE_PROVIDER_SITE_OTHER): Payer: Medicare Other | Admitting: Plastic Surgery

## 2019-10-07 VITALS — BP 120/81 | HR 98 | Temp 98.8°F | Ht <= 58 in | Wt 160.0 lb

## 2019-10-07 DIAGNOSIS — S52501A Unspecified fracture of the lower end of right radius, initial encounter for closed fracture: Secondary | ICD-10-CM

## 2019-10-07 NOTE — Progress Notes (Signed)
Patient is here postop from a right distal radius fracture.  She feels like things are coming along and does not have much pain at rest but will intermittently get sharp pain with some movements or range of motion.  On exam she has good range of motion of her fingers and normal sensation.  She has a well-healed scar.  She has approximately 15 degrees of wrist flexion and extension and good pronation and supination.  X-ray shows essentially no change from prior films.  She did have some collapse of the lunate fossa fragment and may have some of the pegs and up in the radiocarpal joint due to the partial collapse.  All the screws and pegs were clearly not in the radiocarpal joint intraoperatively.  Patient is now 8 weeks out from an open reduction internal fixation of a distal radius fracture with partial displacement of the lunate fossa fragment.  Again allow her to continue with therapy at this point doing some range of motion and potentially some strengthening.  I have asked her to continue to wear her splint when she is not doing her exercises most of the time for the next 2 weeks and then discontinue it.  We will ultimately see how she does with therapy and then determine where to go from there.  If she still having significant pain and symptoms 6 to 9 months from now will consider taking out her plate.  I will plan to see her again in 4 to 6 weeks.

## 2019-10-08 ENCOUNTER — Telehealth: Payer: Self-pay | Admitting: Plastic Surgery

## 2019-10-08 NOTE — Telephone Encounter (Signed)
Returned daughters call, LMVM

## 2019-10-08 NOTE — Telephone Encounter (Signed)
Patient's daughter, Izora Gala, called to follow up on OT order with [facility]. Please call Izora Gala back to advise.

## 2019-10-13 ENCOUNTER — Encounter: Payer: Medicare Other | Admitting: Occupational Therapy

## 2019-10-19 ENCOUNTER — Encounter: Payer: Medicare Other | Admitting: Occupational Therapy

## 2019-10-20 ENCOUNTER — Ambulatory Visit: Payer: Medicare Other | Admitting: Diagnostic Neuroimaging

## 2019-10-26 ENCOUNTER — Encounter: Payer: Medicare Other | Admitting: Occupational Therapy

## 2019-10-28 ENCOUNTER — Encounter: Payer: Medicare Other | Admitting: Occupational Therapy

## 2019-11-03 ENCOUNTER — Encounter: Payer: Medicare Other | Admitting: Occupational Therapy

## 2019-11-04 ENCOUNTER — Telehealth: Payer: Self-pay | Admitting: Oncology

## 2019-11-04 NOTE — Telephone Encounter (Signed)
I received a new pt referral from Dr. Dema Severin for leukocytosis. Erika [name] has been scheduled to see Dr. Jana Hakim on 6/12m at 4pm w/labs at 330pm. Erika [name] has seen Dr. Jana Hakim in the past for lymphoma. Pt aware to arrive 15 minutes early.

## 2019-11-05 ENCOUNTER — Encounter: Payer: Medicare Other | Admitting: Occupational Therapy

## 2019-11-09 ENCOUNTER — Encounter: Payer: Medicare Other | Admitting: Occupational Therapy

## 2019-11-10 NOTE — Progress Notes (Signed)
ID: Erika [name]   DOB: [birth year]  MR#: [id number]  CHE#:[id number]  Patient Care Team: Harlan Stains, MD as PCP - General (Family Medicine) Harlan Stains, MD as Consulting Physician (Family Medicine) Kennith Center, RD as Dietitian (Family Medicine) Cherrill Scrima, Virgie Dad, MD as Consulting Physician (Oncology) OTHER MD:    CHIEF COMPLAINT: Leukocytosis  CURRENT THERAPY: Under evaluation   INTERVAL HISTORY: Erika [name] returns today for evaluation of her new leukocytosis.  This has been present at least for 2-1/2 years.  Results for Erika [name], Erika [name] (MRN [id number]) as of 11/11/2019 17:38  Ref. Range 07/21/2017 11:37 08/09/2019 20:27 08/14/2019 02:30 08/15/2019 05:27 08/15/2019 10:26 11/11/2019 15:13  WBC Latest Ref Range: 4.0 - 10.5 K/uL 12.7 (H) 14.0 (H) 14.3 (H) 11.9 (H) 15.4 (H) 13.8 (H)   We have reviewed her records and the specific subtype of white cell that is elevated is the neutrophils.   Results for Erika [name], Erika [name] (MRN [id number]) as of 11/11/2019 17:38  Ref. Range 07/21/2017 11:37 08/14/2019 02:30 08/15/2019 05:27 11/11/2019 15:13  NEUT# Latest Ref Range: 1.7 - 7.7 K/uL 10.0 (H) 12.1 (H) 9.8 (H) 11.0 (H)   The other white cell subtypes have been normal in number and in particular the lymphocytes are not elevated.  REVIEW OF SYSTEMS: Erika [name] has had a difficult year.  She tells me that between January and March she was falling frequently.  Finally she was taken off Klonopin and other drugs and that problem has resolved.  She did fracture her right wrist in March and had to be in rehab for 6 weeks.  Unfortunately in April her husband developed his own problems and also had to be in rehab for a while.  Erika [name] continues to do physical therapy but what she can do is about 4 minutes twice a week she says at this point.  She lost about 30 pounds she says which she attributes to taking pomegranate juice.  She was not otherwise dieting.  She stopped taking pomegranate juice and stopped losing weight in the  last 2 months.  More specifically on September 22, 2019 her weight was 160 pounds and 8 ounces and today it was 161 pounds and 14 ounces.  She denies fever rash other than a longstanding herpetic rash which comes and goes, unexplained fatigue, unusual headaches visual changes cough phlegm production pleurisy or change in bowel or bladder habits.   HISTORY OF HODGKIN'S LYMPHOMA From the original 2010 intake note:  Erika [name] had been feeling tired for a year or two, that being more tired than would be normal she feels.  She has also lost approximately 60 pounds in the last two years.  She interpreted that as a gift from God, and was just delighted, but then more recently, in addition to the weight loss and fatigue, she started having nausea and vomiting.  She was also having drenching night sweats, which she interprets, possibly accurately, as hot flashes, but possibly they could be due to the tumor.  In any case, she saw Dr. Dema Severin for this, and Dr. Dema Severin thought she really deserved admission for further evaluation.  This was done on June 13, 2008.  Studies that admission included an admission CBC - white cells 20.2, hemoglobin 11.6, and platelets 123,000.  Coags were normal, and CMP was normal except for an alkaline phosphatase of 215, and an ALT of 37.  Calcium was 9.0, albumin 2.4, creatinine 0.60.  LDH was 125, and the ionized calcium was normal at 1.2.  Anemia panel showed  a reticulocyte count of 65, a B12 of 434, a folate of 9 and a ferritin of 261.  ESR was 102, CA-125, CEA and CA19 were all normal, as was urine immunofixation and serum protein electrophoresis and immunofixation.  Her total IgA was 637, which is elevated. Her total IgG was 1280, and her total IgM was 284, which is minimally elevated.  Multiple cultures including blood x two, bronchial washings for AFB and urine were negative, or in the case of urine, non-informative.    On June 17, 2008 she underwent bronchoscopy with the pathology  (C10-127, C10-126, and C10-119) showing a few atypical cells, but basically being nondiagnostic.  During this admission the patient had a CT of the chest, which showed scattered bilateral scarring and left lower lobe atelectasis, as well as some hilar and subcarinal adenopathy.  There was a 3 cm hepatic lobe lesion in the right hepatic lobe, and a second smaller lesion in the same area.  The spleen appeared normal.  There were multiple retroperitoneal surgical clips from her prior surgery for ovarian cancer, and multiple mildly enlarged retroperitoneal lymph nodes.  The pelvis was status post hysterectomy, but no significant adenopathy, and the largest nodal mass measures 4.9 x 2.8 cm in the subcarinal area.  There were also multiple abnormal axillary lymph nodes noted, and there were multiple bilateral sub-centimeter pulmonary nodules predominant in the upper lobes, the largest measuring 7 mm.    On June 19, 2008, the patient had an upper endoscopy by Dr. Michail Sermon, which showed mild gastritis, small hiatal hernia and a mildly tortuous esophagus, but no stricture or malignancy.  On July 01, 2008, the patient has ultrasound-guided biopsy of a right supraclavicular lymph node, which showed (S10-662 and C10-212) predominantly blood and fibrous adipose tissue.    Finally, on July 11, 2008, the patient underwent excision of a deep left axillary lymph node under Dr. 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  The flow cytometry showed no monoclonal B-cell population or abnormal T-cell phenotype, and the cells in question were CD30, CD15 positive, and negative for CD45, CD3 and other markers.    The patient was further staged with a PET scan obtained June 29, 2008.  This showed extensive hypermetabolic lymphadenopathy in the soft tissues of the neck, axilla, mediastinum, retroperitoneum and  small bowel mesentery.  There was a hypermetabolic left upper lobe nodule measuring 7.3 mm with an SUV of 6.3. There was a large focus of intense involvement in the thoracic spine, suggestive of bone involvement.  The right hepatic lobe 2.9 cm lesion had an SUV of 35.6.  There were at least two foci of mildly increased FDG uptake in the spleen as well.    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   PAST MEDICAL HISTORY: Past Medical History:  Diagnosis Date   Anxiety    Asthma    Closed fracture of right distal radius    Coronary atherosclerosis of native coronary artery    Diabetes mellitus without complication ([place])    Dyslipidemia    GERD (gastroesophageal reflux disease)    Headache    hodgkins lymphoma dx'd 07/14/09 & 05/2009   chemo comp; bone marrow transplant   Hypothyroidism    Migraine syndrome    OSA (obstructive sleep apnea)    not  using CPAP- unable to tolerate   Osteoporosis    Ovarian cancer (HCC)    RLS (restless legs syndrome)    Vitamin D deficiency   Depression.  She has a history of migraines, although these are rare.  She has a history of asthma, which is treated by Dr. Dema Severin, a history of tobacco abuse, the patient quitting in 1992, smoking about one-half pack per day for perhaps 30 years.  There is a history of ovarian cancer, and she underwent TAH/BSO in 1999.  I do not have those records.  She also had at least partial omentectomy by her account.  This was done under Dr. Lemont Fillers and Dr. Marti Sleigh.  The patient was told that she did not need any adjuvant treatment, and indeed she has been disease free now for more than ten years.  She has a small hiatal hernia.  She has significant osteoarthritis involving the knees, which makes it difficult for her to ambulate.  She has a history of hypercholesterolemia, and she  has a history of stasis dermatitis involving the left ankle.     PAST SURGICAL HISTORY: Past Surgical History:  Procedure Laterality Date   BONE MARROW TRANSPLANT  2011   CATARACT EXTRACTION     CHOLECYSTECTOMY  1966   ORIF RADIAL FRACTURE Right 08/13/2019   Procedure: OPEN REDUCTION INTERNAL FIXATION (ORIF) RADIAL FRACTURE;  Surgeon: Cindra Presume, MD;  Location: [facility];  Service: Plastics;  Laterality: Right;   vertebroplasties     x 4   VESICOVAGINAL FISTULA CLOSURE W/ TAH  1999    FAMILY HISTORY Family History  Problem Relation Age of Onset   Heart disease Father    Hypertension Father    Alzheimer's disease Mother    Pernicious anemia Mother    Heart disease Brother    Hypertension Brother    Hypertension Sister   The patients father died at the age of 26 from congestive heart failure.  The patients mother died at the age of 45 with Alzheimers disease.  The patient has one brother and one sister, but there is no other cancer in the family.    GYNECOLOGIC HISTORY: She is GX P1, first pregnancy to term at age 27.  After her hysterectomy and salpingo oophorectomy she started estrogen replacement on which she continues.   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  Her husband, [name], was a Freight forwarder for [employer].  The patient has a son from an earlier marriage, Dellis Filbert, who lives in [place], [place], and works in IT for a hospital there.  [name] has four children of his own all in this area.  There are a total of 14 grandchildren and 12 great-grandchildren.  The patient attends [employer], and is very active in that community.     HEALTH MAINTENANCE: Social History   Tobacco Use   Smoking status: Former Smoker    Packs/day: 0.25    Years: 10.00    Pack years: 2.50    Types: Cigarettes    Quit date: 05/27/1992    Years since quitting: 27.4   Smokeless tobacco: Never Used  Vaping Use    Vaping Use: Never used  Substance Use Topics   Alcohol use: Yes    Comment: socially   Drug use: No      Allergies  Allergen Reactions   Cephalexin    Atorvastatin     Memory loss   Clonazepam  Off balance, falling   Septra [Sulfamethoxazole-Trimethoprim] Itching   Sudafed [Pseudoephedrine Hcl] Itching and Swelling   Cipro [Ciprofloxacin Hcl] Rash   Pseudoephedrine Hcl Rash    Current Outpatient Medications  Medication Sig Dispense Refill   alendronate (FOSAMAX) 70 MG tablet 70 mg every 7 (seven) days.     buPROPion (WELLBUTRIN XL) 150 MG 24 hr tablet Take 150 mg by mouth every morning.     Cetirizine HCl (ZYRTEC ALLERGY PO) Take 1 tablet by mouth daily.      escitalopram (LEXAPRO) 20 MG tablet Take 10 mg by mouth daily.      esomeprazole (NEXIUM) 40 MG capsule Take 30-60 min before first meal of the day (Patient taking differently: Take 40 mg by mouth daily. Take 30-60 min before first meal of the day)     famotidine (PEPCID) 20 MG tablet One at bedtime (Patient taking differently: Take 20 mg by mouth at bedtime. One at bedtime) 30 tablet 2   fluticasone furoate-vilanterol (BREO ELLIPTA) 200-25 MCG/INH AEPB Inhale 1 puff into the lungs daily. 180 each 3   HYDROcodone-acetaminophen (NORCO) 5-325 MG tablet Take 1 tablet by mouth every 6 (six) hours as needed for moderate pain. 10 tablet 0   metFORMIN (GLUCOPHAGE-XR) 500 MG 24 hr tablet Take 500 mg by mouth at bedtime.   5   montelukast (SINGULAIR) 10 MG tablet Take 10 mg by mouth at bedtime.     nystatin cream (MYCOSTATIN) Apply 1 application topically 2 (two) times daily as needed for itching.     ondansetron (ZOFRAN-ODT) 4 MG disintegrating tablet Take 4 mg by mouth every 8 (eight) hours as needed.     SUMAtriptan (IMITREX) 50 MG tablet Take 50 mg by mouth every 2 (two) hours as needed for migraine. May repeat in 2 hours if headache persists or recurs.     SYNTHROID 100 MCG tablet Take 100 mcg by mouth  daily before breakfast.      pramipexole (MIRAPEX) 0.125 MG tablet      No current facility-administered medications for this visit.    OBJECTIVE:  white woman in no acute distress  Vitals:   11/11/19 1550  BP: 134/77  Pulse: 90  Resp: 18  Temp: 98.2 F (36.8 C)  SpO2: 99%     Body mass index is 32.7 kg/m.    ECOG FS: 1 Filed Weights   11/11/19 1550  Weight: 161 lb 14.4 oz (73.4 kg)     Sclerae unicteric, EOMs intact Wearing a mask No cervical or supraclavicular adenopathy, no axillary or inguinal adenopathy Lungs no rales or rhonchi Heart regular rate and rhythm Abd soft, obese, nontender, positive bowel sounds MSK kyphosis but no focal spinal tenderness, no obvious joint edema Neuro: nonfocal, well oriented, appropriate affect Breasts: Deferred   LAB RESULTS: Lab Results  Component Value Date   WBC 13.8 (H) 11/11/2019   NEUTROABS 11.0 (H) 11/11/2019   HGB 12.2 11/11/2019   HCT 39.0 11/11/2019   MCV 84.8 11/11/2019   PLT 297 11/11/2019   PLT 414 (H) 11/11/2019      Chemistry      Component Value Date/Time   NA 139 11/11/2019 1513   NA 139 12/25/2016 1107   K 3.8 11/11/2019 1513   K 3.8 12/25/2016 1107   CL 106 11/11/2019 1513   CL 105 06/15/2012 1257   CO2 21 (L) 11/11/2019 1513   CO2 24 12/25/2016 1107   BUN 10 11/11/2019 1513   BUN 8.6 12/25/2016 1107  CREATININE 0.77 11/11/2019 1513   CREATININE 0.7 12/25/2016 1107      Component Value Date/Time   CALCIUM 8.9 11/11/2019 1513   CALCIUM 9.0 12/25/2016 1107   ALKPHOS 100 11/11/2019 1513   ALKPHOS 99 12/25/2016 1107   AST 15 11/11/2019 1513   AST 18 12/25/2016 1107   ALT 16 11/11/2019 1513   ALT 23 12/25/2016 1107   BILITOT 0.3 11/11/2019 1513   BILITOT 0.25 12/25/2016 1107      STUDIES: No results found.   ASSESSMENT: 74 y.o. [place] woman   (1)  with a history of nodular sclerosing Hodgkin's disease, clinically stage IV at presentation, treated with 6 cycles of AVD (the  bleomycin was held secondary to concerns regarding possible pulmonary toxicity). Chemotherapy was completed August of 2010.   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  (3) hypermetabolic lesion in the spleen noted by PET scan 10/19/2013  (a) "triple vaccine" given 10/26/2013  (4) leukocytosis noted February 2019, persistent, primarily neutrophils  REVIEW OF BLOOD FILM 11/11/2019: Red cells show no significant anisocytosis and specifically no tailed poikilocytes and no nucleated red blood cells.  There are rare platelet clumps in the citrated specimen.  The white cell count shows no left shift and no dysplasia.  This is an essentially normal blood film  PLAN: Erika [name] is at risk for myelodysplasia or leukemia given her prior treatment with high-dose chemotherapy 11 years ago.  However those problems would manifest themselves usually as cytopenias not elevated counts.    We reviewed the fact that there are 3 major types of blood cells all of which were made in the marrow bones.  Her red cells are normal in shape and number and she has a normal reticulocyte count.  Her platelet count is actually slightly elevated today.  She does have a history of artifactual platelet clumping and whenever a very out-of-the-box reading like to 28,000 she had in March occurs that should be repeated preferably on a citrate tube which uses a different anticoagulant than the usual EDTA and the "purple tops".  Finally the white cells are the immune system.  I would be concerned if she had an elevated lymphocyte count but hers is normal.  The white cells do not show any dysplasia and they do not show any immaturity.  We discussed the fact that both neutrophils and platelets react to infection and inflammation.  She does have a slightly elevated C-reactive protein at 3.  The sed rate was  also moderately elevated at 38.  Fibrinogen which is also an acute phase reactant was also increased at 635.  All the other lab work was unremarkable.  Insured I think Erika [name] has a chronic inflammatory problem.  It is not particularly symptomatic and it has been present for 2-1/2 years.  The most likely cause is obesity.  Fat cells cause inflammation most likely because they have poor blood supply and fat necrosis occurs which then requires an inflammatory response to "clean up".  Fat cells also produce excess estrogen and excess estrogen has been associated with a moderate increase in white cells as for example in pregnancy.  Finally platelet clumps can be read by mechanical counter as white cells but I do not think that is what is happening here  At this point I do not think any further evaluation is necessary and certainly no  bone marrow biopsy is called for.  I would continue to follow Erika [name]'s counts every 3 months with a CBC and differential and if there is a marked trend either upwards or downwards in either her hemoglobin platelet count or white cell/neutrophils I will be glad to see her again and consider a marrow at that time  She was very pleased to hear that I do not see any evidence of cancer  We did discuss diet and exercise issues I think in the longer term this will be the most important intervention for her.  If she can get to 15 minutes of walking twice a day and I continue to lose weight either by taking pomegranate juice or simply avoiding carbohydrates it would be reassuring  I am not making a return appointment for her here but I will be glad to see her again at any point in the future as needed  Total encounter time 50 minutes.Sarajane Jews C. Johnmatthew Solorio MD   11/11/2019 Oncology and Hematology [facility] [place], [place] [address] Tel. [phone]  Joylene Igo [phone]   I, Wilburn Mylar, am acting as scribe for Dr. Virgie Dad. Shayda Kalka.  I, Lurline Del MD, have reviewed the above documentation for accuracy and completeness, and I agree with the above.    *Total Encounter Time as defined by the Centers for Medicare and Medicaid Services includes, in addition to the face-to-face time of a patient visit (documented in the note above) non-face-to-face time: obtaining and reviewing outside history, ordering and reviewing medications, tests or procedures, care coordination (communications with other health care professionals or caregivers) and documentation in the medical record.

## 2019-11-11 ENCOUNTER — Inpatient Hospital Stay: Payer: Medicare Other | Attending: Oncology | Admitting: Oncology

## 2019-11-11 ENCOUNTER — Other Ambulatory Visit: Payer: Self-pay

## 2019-11-11 ENCOUNTER — Inpatient Hospital Stay: Payer: Medicare Other

## 2019-11-11 ENCOUNTER — Encounter: Payer: Medicare Other | Admitting: Occupational Therapy

## 2019-11-11 ENCOUNTER — Encounter: Payer: Self-pay | Admitting: Oncology

## 2019-11-11 VITALS — BP 134/77 | HR 90 | Temp 98.2°F | Resp 18 | Ht 59.0 in | Wt 161.9 lb

## 2019-11-11 DIAGNOSIS — Z881 Allergy status to other antibiotic agents status: Secondary | ICD-10-CM | POA: Insufficient documentation

## 2019-11-11 DIAGNOSIS — Z832 Family history of diseases of the blood and blood-forming organs and certain disorders involving the immune mechanism: Secondary | ICD-10-CM | POA: Diagnosis not present

## 2019-11-11 DIAGNOSIS — Z882 Allergy status to sulfonamides status: Secondary | ICD-10-CM | POA: Diagnosis not present

## 2019-11-11 DIAGNOSIS — Z87891 Personal history of nicotine dependence: Secondary | ICD-10-CM

## 2019-11-11 DIAGNOSIS — E1165 Type 2 diabetes mellitus with hyperglycemia: Secondary | ICD-10-CM | POA: Diagnosis not present

## 2019-11-11 DIAGNOSIS — Z818 Family history of other mental and behavioral disorders: Secondary | ICD-10-CM | POA: Diagnosis not present

## 2019-11-11 DIAGNOSIS — C8110 Nodular sclerosis classical Hodgkin lymphoma, unspecified site: Secondary | ICD-10-CM

## 2019-11-11 DIAGNOSIS — R7982 Elevated C-reactive protein (CRP): Secondary | ICD-10-CM | POA: Insufficient documentation

## 2019-11-11 DIAGNOSIS — Z9221 Personal history of antineoplastic chemotherapy: Secondary | ICD-10-CM | POA: Diagnosis not present

## 2019-11-11 DIAGNOSIS — J45909 Unspecified asthma, uncomplicated: Secondary | ICD-10-CM | POA: Insufficient documentation

## 2019-11-11 DIAGNOSIS — Z90722 Acquired absence of ovaries, bilateral: Secondary | ICD-10-CM | POA: Diagnosis not present

## 2019-11-11 DIAGNOSIS — Z79899 Other long term (current) drug therapy: Secondary | ICD-10-CM

## 2019-11-11 DIAGNOSIS — D72829 Elevated white blood cell count, unspecified: Secondary | ICD-10-CM | POA: Diagnosis present

## 2019-11-11 DIAGNOSIS — R7989 Other specified abnormal findings of blood chemistry: Secondary | ICD-10-CM | POA: Diagnosis not present

## 2019-11-11 DIAGNOSIS — Z8571 Personal history of Hodgkin lymphoma: Secondary | ICD-10-CM

## 2019-11-11 DIAGNOSIS — Z8543 Personal history of malignant neoplasm of ovary: Secondary | ICD-10-CM | POA: Diagnosis not present

## 2019-11-11 DIAGNOSIS — D729 Disorder of white blood cells, unspecified: Secondary | ICD-10-CM | POA: Insufficient documentation

## 2019-11-11 DIAGNOSIS — Z8249 Family history of ischemic heart disease and other diseases of the circulatory system: Secondary | ICD-10-CM | POA: Diagnosis not present

## 2019-11-11 DIAGNOSIS — E119 Type 2 diabetes mellitus without complications: Secondary | ICD-10-CM

## 2019-11-11 DIAGNOSIS — E7800 Pure hypercholesterolemia, unspecified: Secondary | ICD-10-CM | POA: Diagnosis not present

## 2019-11-11 LAB — COMPREHENSIVE METABOLIC PANEL
ALT: 16 U/L (ref 0–44)
AST: 15 U/L (ref 15–41)
Albumin: 3.4 g/dL — ABNORMAL LOW (ref 3.5–5.0)
Alkaline Phosphatase: 100 U/L (ref 38–126)
Anion gap: 12 (ref 5–15)
BUN: 10 mg/dL (ref 8–23)
CO2: 21 mmol/L — ABNORMAL LOW (ref 22–32)
Calcium: 8.9 mg/dL (ref 8.9–10.3)
Chloride: 106 mmol/L (ref 98–111)
Creatinine, Ser: 0.77 mg/dL (ref 0.44–1.00)
GFR calc Af Amer: >60 mL/min (ref >60)
GFR calc non Af Amer: >60 mL/min (ref >60)
Glucose, Bld: 93 mg/dL (ref 70–99)
Potassium: 3.8 mmol/L (ref 3.5–5.1)
Sodium: 139 mmol/L (ref 135–145)
Total Bilirubin: 0.3 mg/dL (ref 0.3–1.2)
Total Protein: 7.1 g/dL (ref 6.5–8.1)

## 2019-11-11 LAB — CBC WITH DIFFERENTIAL/PLATELET
Abs Immature Granulocytes: 0.12 10*3/uL — ABNORMAL HIGH (ref 0.00–0.07)
Basophils Absolute: 0.1 10*3/uL (ref 0.0–0.1)
Basophils Relative: 0 %
Eosinophils Absolute: 0.3 10*3/uL (ref 0.0–0.5)
Eosinophils Relative: 2 %
HCT: 39 % (ref 36.0–46.0)
Hemoglobin: 12.2 g/dL (ref 12.0–15.0)
Immature Granulocytes: 1 %
Lymphocytes Relative: 11 %
Lymphs Abs: 1.5 10*3/uL (ref 0.7–4.0)
MCH: 26.5 pg (ref 26.0–34.0)
MCHC: 31.3 g/dL (ref 30.0–36.0)
MCV: 84.8 fL (ref 80.0–100.0)
Monocytes Absolute: 0.8 10*3/uL (ref 0.1–1.0)
Monocytes Relative: 6 %
Neutro Abs: 11 10*3/uL — ABNORMAL HIGH (ref 1.7–7.7)
Neutrophils Relative %: 80 %
Platelets: 414 10*3/uL — ABNORMAL HIGH (ref 150–400)
RBC: 4.6 MIL/uL (ref 3.87–5.11)
RDW: 16 % — ABNORMAL HIGH (ref 11.5–15.5)
WBC: 13.8 10*3/uL — ABNORMAL HIGH (ref 4.0–10.5)
nRBC: 0 % (ref 0.0–0.2)

## 2019-11-11 LAB — RETICULOCYTES
Immature Retic Fract: 15.5 % (ref 2.3–15.9)
RBC.: 4.6 MIL/uL (ref 3.87–5.11)
Retic Count, Absolute: 83.7 10*3/uL (ref 19.0–186.0)
Retic Ct Pct: 1.8 % (ref 0.4–3.1)

## 2019-11-11 LAB — PLATELET BY CITRATE

## 2019-11-11 LAB — DIC (DISSEMINATED INTRAVASCULAR COAGULATION)PANEL
D-Dimer, Quant: 0.5 ug/mL-FEU (ref 0.00–0.50)
Fibrinogen: 635 mg/dL — ABNORMAL HIGH (ref 210–475)
INR: 1 (ref 0.8–1.2)
Platelets: 297 10*3/uL (ref 150–400)
Prothrombin Time: 12.7 seconds (ref 11.4–15.2)
Smear Review: NONE SEEN
aPTT: 35 seconds (ref 24–36)

## 2019-11-11 LAB — SEDIMENTATION RATE: Sed Rate: 38 mm/hr — ABNORMAL HIGH (ref 0–22)

## 2019-11-11 LAB — LACTATE DEHYDROGENASE: LDH: 127 U/L (ref 98–192)

## 2019-11-11 LAB — SAVE SMEAR(SSMR), FOR PROVIDER SLIDE REVIEW

## 2019-11-11 LAB — C-REACTIVE PROTEIN: CRP: 3 mg/dL — ABNORMAL HIGH (ref <1.0)

## 2019-11-12 ENCOUNTER — Telehealth: Payer: Self-pay | Admitting: Oncology

## 2019-11-12 LAB — CEA (IN HOUSE-CHCC): CEA (CHCC-In House): 3.62 ng/mL (ref 0.00–5.00)

## 2019-11-12 NOTE — Telephone Encounter (Signed)
No 6/17 los. No changes made to pt's schedule.  

## 2019-11-17 ENCOUNTER — Other Ambulatory Visit: Payer: Self-pay | Admitting: Plastic Surgery

## 2019-11-17 ENCOUNTER — Encounter: Payer: Self-pay | Admitting: Plastic Surgery

## 2019-11-17 ENCOUNTER — Other Ambulatory Visit: Payer: Self-pay

## 2019-11-17 ENCOUNTER — Ambulatory Visit
Admission: RE | Admit: 2019-11-17 | Discharge: 2019-11-17 | Disposition: A | Discharge status: Home / Self Care | Payer: Medicare Other | Source: Ambulatory Visit | Attending: Plastic Surgery | Admitting: Plastic Surgery

## 2019-11-17 ENCOUNTER — Ambulatory Visit (INDEPENDENT_AMBULATORY_CARE_PROVIDER_SITE_OTHER): Payer: Medicare Other | Admitting: Plastic Surgery

## 2019-11-17 VITALS — BP 131/78 | HR 95 | Temp 98.4°F

## 2019-11-17 DIAGNOSIS — S52501A Unspecified fracture of the lower end of right radius, initial encounter for closed fracture: Secondary | ICD-10-CM

## 2019-11-17 NOTE — Progress Notes (Signed)
   Referring Provider Harlan Stains, MD [address],  [place] [id number]   CC:  Chief Complaint  Patient presents with  . Follow-up      Erika [name] is an 74 y.o. female.  HPI: Patient presents 3 months out from an open reduction internal fixation of a heavily displaced right distal radius fracture.  Early postop she did have a slight proximal displacement of the lunate fossa segment which resulted in some prominence to the pegs on that side but overall she feels like she is doing great at this point.  She has minimal pain with range of motion.  The only pain she does have is along the ulna.  Otherwise she feels like she has good function and is happy.  Physical Exam Vitals with BMI 11/17/2019 11/11/2019 10/07/2019  Height - 4\' 11"  4\' 10"   Weight - 161 lbs 14 oz 160 lbs  BMI - 69.45 03.88  Systolic 828 003 491  Diastolic 78 77 81  Pulse 95 90 98    General:  No acute distress,  Alert and oriented, Non-Toxic, Normal speech and affect Right hand: Fingers are well-perfused with normal capillary filling popliteal pulse.  Sensation is intact throughout.  She has full range of motion of the fingers.  Wrist flexion and extension are at least 30 degrees.  She looks to have full pronation and some limitation with supination.  Her scar is healed fine.  She does not have any tenderness over the dorsal radial aspect of the wrist.  X-ray from today shows unchanged positioning of the hardware and unchanged alignment of her fracture.  Assessment/Plan Patient presents 3 months out from an open reduction internal fixation of the right distal radius fracture.  Clinically she is doing reasonably well.  She not interested in any further revisions or hardware removal at this time.  She has 2 more physical therapy visits left and plans to discontinue thereafter.  I showed her x-rays of the plate and screw positioning but she is not interested in them considering anything further.  I  think as long as she is doing well clinically that this should be fine.  If she does notice any issues in the future she is more than welcome to come back for reassessment.  I also offered her a follow-up appointment 3 months from now but she would rather just call if she has any trouble.  All of her questions were answered and she will follow up as needed.  Erika [name] 11/17/2019, 2:05 PM

## 2020-04-13 ENCOUNTER — Other Ambulatory Visit: Payer: Self-pay | Admitting: Family Medicine

## 2020-04-13 DIAGNOSIS — R911 Solitary pulmonary nodule: Secondary | ICD-10-CM

## 2020-05-04 ENCOUNTER — Ambulatory Visit
Admission: RE | Admit: 2020-05-04 | Discharge: 2020-05-04 | Disposition: A | Discharge status: Home / Self Care | Payer: Medicare Other | Source: Ambulatory Visit | Attending: Family Medicine | Admitting: Family Medicine

## 2020-05-04 DIAGNOSIS — R911 Solitary pulmonary nodule: Secondary | ICD-10-CM
